# Patient Record
Sex: Female | Born: 1940 | Race: White | Hispanic: No | Marital: Married | Smoking: Former smoker
Health system: Southern US, Community
[De-identification: ages and names within clinical notes are randomized; demographics above are authoritative.]

## PROBLEM LIST (undated history)

## (undated) DIAGNOSIS — Z87440 Personal history of urinary (tract) infections: Secondary | ICD-10-CM

## (undated) DIAGNOSIS — Z87891 Personal history of nicotine dependence: Secondary | ICD-10-CM

## (undated) DIAGNOSIS — N200 Calculus of kidney: Secondary | ICD-10-CM

## (undated) DIAGNOSIS — F0390 Unspecified dementia without behavioral disturbance: Secondary | ICD-10-CM

## (undated) DIAGNOSIS — M1990 Unspecified osteoarthritis, unspecified site: Secondary | ICD-10-CM

## (undated) DIAGNOSIS — E876 Hypokalemia: Secondary | ICD-10-CM

## (undated) DIAGNOSIS — S02401A Maxillary fracture, unspecified, initial encounter for closed fracture: Secondary | ICD-10-CM

## (undated) DIAGNOSIS — I10 Essential (primary) hypertension: Secondary | ICD-10-CM

## (undated) DIAGNOSIS — N189 Chronic kidney disease, unspecified: Secondary | ICD-10-CM

## (undated) DIAGNOSIS — K6289 Other specified diseases of anus and rectum: Secondary | ICD-10-CM

## (undated) DIAGNOSIS — D649 Anemia, unspecified: Secondary | ICD-10-CM

## (undated) DIAGNOSIS — G309 Alzheimer's disease, unspecified: Secondary | ICD-10-CM

## (undated) DIAGNOSIS — K7689 Other specified diseases of liver: Secondary | ICD-10-CM

## (undated) DIAGNOSIS — K208 Other esophagitis: Secondary | ICD-10-CM

## (undated) DIAGNOSIS — F0280 Dementia in other diseases classified elsewhere without behavioral disturbance: Secondary | ICD-10-CM

## (undated) HISTORY — DX: Essential (primary) hypertension: I10

## (undated) HISTORY — DX: Personal history of nicotine dependence: Z87.891

## (undated) HISTORY — DX: Other specified diseases of anus and rectum: K62.89

## (undated) HISTORY — DX: Other specified diseases of liver: K76.89

## (undated) HISTORY — DX: Hypokalemia: E87.6

## (undated) HISTORY — DX: Other esophagitis: K20.8

## (undated) HISTORY — DX: Maxillary fracture, unspecified side, initial encounter for closed fracture: S02.401A

## (undated) HISTORY — DX: Unspecified osteoarthritis, unspecified site: M19.90

## (undated) HISTORY — DX: Alzheimer's disease, unspecified: G30.9

## (undated) HISTORY — PX: COLONOSCOPY: SHX174

## (undated) HISTORY — PX: BREAST REDUCTION SURGERY: SHX8

## (undated) HISTORY — DX: Dementia in other diseases classified elsewhere, unspecified severity, without behavioral disturbance, psychotic disturbance, mood disturbance, and anxiety: F02.80

## (undated) HISTORY — DX: Personal history of urinary (tract) infections: Z87.440

## (undated) HISTORY — DX: Unspecified dementia without behavioral disturbance: F03.90

## (undated) HISTORY — DX: Calculus of kidney: N20.0

---

## 1983-08-28 HISTORY — PX: BREAST LUMPECTOMY: SHX2

## 1999-08-28 DIAGNOSIS — I10 Essential (primary) hypertension: Secondary | ICD-10-CM

## 1999-08-28 HISTORY — DX: Essential (primary) hypertension: I10

## 2001-08-27 HISTORY — PX: BREAST REDUCTION SURGERY: SHX8

## 2001-10-06 ENCOUNTER — Other Ambulatory Visit: Admission: RE | Admit: 2001-10-06 | Discharge: 2001-10-06 | Payer: Self-pay | Admitting: Family Medicine

## 2003-08-28 DIAGNOSIS — M1990 Unspecified osteoarthritis, unspecified site: Secondary | ICD-10-CM

## 2003-08-28 HISTORY — PX: WRIST SURGERY: SHX841

## 2003-08-28 HISTORY — DX: Unspecified osteoarthritis, unspecified site: M19.90

## 2003-08-28 HISTORY — PX: VEIN LIGATION AND STRIPPING: SHX2653

## 2004-12-04 ENCOUNTER — Ambulatory Visit: Payer: Self-pay | Admitting: Family Medicine

## 2004-12-07 ENCOUNTER — Ambulatory Visit: Payer: Self-pay | Admitting: Family Medicine

## 2005-01-15 ENCOUNTER — Ambulatory Visit: Payer: Self-pay | Admitting: Family Medicine

## 2006-01-22 ENCOUNTER — Ambulatory Visit: Payer: Self-pay | Admitting: Family Medicine

## 2006-05-27 ENCOUNTER — Ambulatory Visit: Payer: Self-pay | Admitting: Podiatry

## 2006-05-27 ENCOUNTER — Other Ambulatory Visit: Payer: Self-pay

## 2006-05-29 ENCOUNTER — Ambulatory Visit: Payer: Self-pay | Admitting: Podiatry

## 2007-02-06 ENCOUNTER — Ambulatory Visit: Payer: Self-pay | Admitting: Family Medicine

## 2008-02-09 ENCOUNTER — Ambulatory Visit: Payer: Self-pay | Admitting: Family Medicine

## 2008-02-26 ENCOUNTER — Ambulatory Visit: Payer: Self-pay | Admitting: Gastroenterology

## 2008-08-27 DIAGNOSIS — Z87440 Personal history of urinary (tract) infections: Secondary | ICD-10-CM

## 2008-08-27 DIAGNOSIS — K6289 Other specified diseases of anus and rectum: Secondary | ICD-10-CM

## 2008-08-27 HISTORY — PX: COLON SURGERY: SHX602

## 2008-08-27 HISTORY — DX: Other specified diseases of anus and rectum: K62.89

## 2008-08-27 HISTORY — DX: Personal history of urinary (tract) infections: Z87.440

## 2009-01-11 ENCOUNTER — Ambulatory Visit: Payer: Self-pay | Admitting: General Surgery

## 2009-01-19 ENCOUNTER — Ambulatory Visit: Payer: Self-pay | Admitting: General Surgery

## 2009-01-26 ENCOUNTER — Inpatient Hospital Stay: Payer: Self-pay | Admitting: General Surgery

## 2009-02-16 ENCOUNTER — Ambulatory Visit: Payer: Self-pay | Admitting: Family Medicine

## 2009-02-17 ENCOUNTER — Ambulatory Visit: Payer: Self-pay | Admitting: Family Medicine

## 2009-08-27 DIAGNOSIS — K208 Other esophagitis without bleeding: Secondary | ICD-10-CM

## 2009-08-27 DIAGNOSIS — Z87891 Personal history of nicotine dependence: Secondary | ICD-10-CM

## 2009-08-27 HISTORY — PX: UPPER GI ENDOSCOPY: SHX6162

## 2009-08-27 HISTORY — DX: Other esophagitis without bleeding: K20.80

## 2009-08-27 HISTORY — DX: Personal history of nicotine dependence: Z87.891

## 2009-08-27 HISTORY — PX: OTHER SURGICAL HISTORY: SHX169

## 2010-02-20 ENCOUNTER — Ambulatory Visit: Payer: Self-pay | Admitting: Family Medicine

## 2010-03-24 ENCOUNTER — Ambulatory Visit: Payer: Self-pay | Admitting: General Surgery

## 2010-04-04 ENCOUNTER — Ambulatory Visit: Payer: Self-pay | Admitting: General Surgery

## 2010-08-27 DIAGNOSIS — F0390 Unspecified dementia without behavioral disturbance: Secondary | ICD-10-CM

## 2010-08-27 HISTORY — DX: Unspecified dementia, unspecified severity, without behavioral disturbance, psychotic disturbance, mood disturbance, and anxiety: F03.90

## 2011-02-22 ENCOUNTER — Ambulatory Visit: Payer: Self-pay | Admitting: Family Medicine

## 2011-05-09 ENCOUNTER — Ambulatory Visit: Payer: Self-pay | Admitting: Neurology

## 2011-06-08 ENCOUNTER — Emergency Department: Payer: Self-pay | Admitting: Emergency Medicine

## 2011-09-06 ENCOUNTER — Ambulatory Visit: Payer: Self-pay | Admitting: Internal Medicine

## 2011-09-27 ENCOUNTER — Ambulatory Visit (INDEPENDENT_AMBULATORY_CARE_PROVIDER_SITE_OTHER): Payer: Medicare Other | Admitting: Internal Medicine

## 2011-09-27 ENCOUNTER — Encounter: Payer: Self-pay | Admitting: Internal Medicine

## 2011-09-27 DIAGNOSIS — R634 Abnormal weight loss: Secondary | ICD-10-CM

## 2011-09-27 DIAGNOSIS — I10 Essential (primary) hypertension: Secondary | ICD-10-CM

## 2011-09-27 DIAGNOSIS — F0391 Unspecified dementia with behavioral disturbance: Secondary | ICD-10-CM

## 2011-09-27 DIAGNOSIS — F03918 Unspecified dementia, unspecified severity, with other behavioral disturbance: Secondary | ICD-10-CM | POA: Insufficient documentation

## 2011-09-27 NOTE — Assessment & Plan Note (Signed)
Blood pressure well-controlled today. We'll check renal function with labs. Followup in 3 months.

## 2011-09-27 NOTE — Assessment & Plan Note (Signed)
Agreed that this is likely related to recent episode of psychosis and medications. Will however check CMP, CBC with labs. Followup in 3 months.

## 2011-09-27 NOTE — Progress Notes (Signed)
Subjective:    Patient ID: Kelli Matthews, female    DOB: Feb 10, 1941, 71 y.o.   MRN: [id number]  HPI 71 year old female with history of dementia, hypertension presents to establish care. She reports that she is generally doing well. She notes that she suffered from what was diagnosed as a psychotic break in July of 2012. She had extensive intervention with psychiatry and psychologist, and reports that her symptoms are much better controlled on current medications. She is currently living with her husband and attends respite care at [facility] facility twice per week. Her son is also very involved in her care and helps with her everyday activities.  In regards to her hypertension, she reports that her blood pressure has been well-controlled. She denies chest pain, palpitations, or headache.  Her family does note some recent weight loss over the last several months. However, they attribute this to her psychosis and treatment for her psychosis and dementia.  Outpatient Encounter Prescriptions as of 09/27/2011  Medication Sig Dispense Refill  . aspirin EC 81 MG tablet Take 81 mg by mouth daily.      . cholecalciferol (VITAMIN D) 1000 UNITS tablet Take 1,000 Units by mouth daily.      Marland Kitchen donepezil (ARICEPT) 10 MG tablet Take 10 mg by mouth at bedtime as needed.      . fish oil-omega-3 fatty acids 1000 MG capsule Take 2 g by mouth daily.      Marland Kitchen lisinopril-hydrochlorothiazide (PRINZIDE,ZESTORETIC) 10-12.5 MG per tablet Take 1 tablet by mouth daily.      . memantine (NAMENDA) 10 MG tablet Take 10 mg by mouth 2 (two) times daily.      . metoprolol succinate (TOPROL-XL) 25 MG 24 hr tablet Take 25 mg by mouth daily.      . Multiple Vitamins-Minerals (MULTIVITAMIN WITH MINERALS) tablet Take 1 tablet by mouth daily.      . potassium chloride (K-DUR) 10 MEQ tablet Take 10 mEq by mouth 2 (two) times daily.      . QUEtiapine (SEROQUEL) 50 MG tablet Take 75 mg by mouth at bedtime.      . sertraline (ZOLOFT) 100 MG  tablet Take 100 mg by mouth daily.        Review of Systems  Constitutional: Negative for fever, chills, appetite change, fatigue and unexpected weight change.  HENT: Negative for ear pain, congestion, sore throat, trouble swallowing, neck pain, voice change and sinus pressure.   Eyes: Negative for visual disturbance.  Respiratory: Negative for cough, shortness of breath, wheezing and stridor.   Cardiovascular: Negative for chest pain, palpitations and leg swelling.  Gastrointestinal: Negative for nausea, vomiting, abdominal pain, diarrhea, constipation, blood in stool, abdominal distention and anal bleeding.  Genitourinary: Negative for dysuria and flank pain.  Musculoskeletal: Negative for myalgias, arthralgias and gait problem.  Skin: Negative for color change and rash.  Neurological: Negative for dizziness and headaches.  Hematological: Negative for adenopathy. Does not bruise/bleed easily.  Psychiatric/Behavioral: Positive for behavioral problems, confusion and decreased concentration. Negative for suicidal ideas, sleep disturbance and dysphoric mood. The patient is not nervous/anxious.    BP 126/62  Pulse 74  Temp(Src) 98.3 F (36.8 C) (Oral)  Ht 5\' 1"  (1.549 m)  Wt 109 lb (49.442 kg)  BMI 20.60 kg/m2  SpO2 99%     Objective:   Physical Exam  Constitutional: She is oriented to person, place, and time. She appears well-developed and well-nourished. No distress.  HENT:  Head: Normocephalic and atraumatic.  Right Ear: External  ear normal.  Left Ear: External ear normal.  Nose: Nose normal.  Mouth/Throat: Oropharynx is clear and moist. No oropharyngeal exudate.  Eyes: Conjunctivae are normal. Pupils are equal, round, and reactive to light. Right eye exhibits no discharge. Left eye exhibits no discharge. No scleral icterus.  Neck: Normal range of motion. Neck supple. No tracheal deviation present. No thyromegaly present.  Cardiovascular: Normal rate, regular rhythm, normal  heart sounds and intact distal pulses.  Exam reveals no gallop and no friction rub.   No murmur heard. Pulmonary/Chest: Effort normal and breath sounds normal. No respiratory distress. She has no wheezes. She has no rales. She exhibits no tenderness.  Abdominal: Soft. Bowel sounds are normal. She exhibits no distension. There is no tenderness.  Musculoskeletal: Normal range of motion. She exhibits no edema and no tenderness.  Lymphadenopathy:    She has no cervical adenopathy.  Neurological: She is alert and oriented to person, place, and time. No cranial nerve deficit. She exhibits normal muscle tone. Coordination normal.  Skin: Skin is warm and dry. No rash noted. She is not diaphoretic. No erythema. No pallor.  Psychiatric: She has a normal mood and affect. Her speech is normal and behavior is normal. Judgment and thought content normal. She exhibits abnormal recent memory and abnormal remote memory.          Assessment & Plan:

## 2011-09-27 NOTE — Assessment & Plan Note (Signed)
Will obtain records from psychiatrist and psychologist. Kelli Matthews continue current medications. Followup in 3 months.

## 2011-09-28 LAB — CBC WITH DIFFERENTIAL/PLATELET
Basophils Absolute: 0.1 10*3/uL (ref 0.0–0.1)
Eosinophils Absolute: 0.2 10*3/uL (ref 0.0–0.7)
Hemoglobin: 12.3 g/dL (ref 12.0–15.0)
Lymphocytes Relative: 21.5 % (ref 12.0–46.0)
MCHC: 33.8 g/dL (ref 30.0–36.0)
Neutro Abs: 4.8 10*3/uL (ref 1.4–7.7)
Neutrophils Relative %: 65.2 % (ref 43.0–77.0)
Platelets: 300 10*3/uL (ref 150.0–400.0)
RDW: 13 % (ref 11.5–14.6)

## 2011-09-28 LAB — COMPREHENSIVE METABOLIC PANEL
ALT: 24 U/L (ref 0–35)
AST: 27 U/L (ref 0–37)
Albumin: 4.5 g/dL (ref 3.5–5.2)
Calcium: 9.6 mg/dL (ref 8.4–10.5)
Chloride: 109 mEq/L (ref 96–112)
Potassium: 3.9 mEq/L (ref 3.5–5.1)
Sodium: 139 mEq/L (ref 135–145)
Total Protein: 7.5 g/dL (ref 6.0–8.3)

## 2011-10-31 ENCOUNTER — Telehealth: Payer: Self-pay | Admitting: Internal Medicine

## 2011-10-31 NOTE — Telephone Encounter (Signed)
Patient has an appt for tomorrow. Spoke with sone and he will bring her tomorrow for her appt. He was advised to make sure she is drinking plenty of clear liquid to avoid dehydration and also was advised bland diet such as the brat diet.

## 2011-10-31 NOTE — Telephone Encounter (Signed)
[phone] Pt son called wanting to get apponitment for Kelli Matthews  Made for 3/7 @ 4 This weekend she diarrehea not eatng.  Pt has demintia and all she can say is she doesn't feel good.  Son stated pt is groggy and won't get out of bed

## 2011-11-01 ENCOUNTER — Encounter: Payer: Self-pay | Admitting: Internal Medicine

## 2011-11-01 ENCOUNTER — Other Ambulatory Visit: Payer: Self-pay | Admitting: Internal Medicine

## 2011-11-01 ENCOUNTER — Ambulatory Visit (INDEPENDENT_AMBULATORY_CARE_PROVIDER_SITE_OTHER): Payer: Medicare Other | Admitting: Internal Medicine

## 2011-11-01 VITALS — BP 138/68 | HR 93 | Temp 98.4°F | Ht 61.0 in | Wt 108.0 lb

## 2011-11-01 DIAGNOSIS — R110 Nausea: Secondary | ICD-10-CM

## 2011-11-01 DIAGNOSIS — R634 Abnormal weight loss: Secondary | ICD-10-CM

## 2011-11-01 DIAGNOSIS — I10 Essential (primary) hypertension: Secondary | ICD-10-CM

## 2011-11-01 DIAGNOSIS — R197 Diarrhea, unspecified: Secondary | ICD-10-CM | POA: Insufficient documentation

## 2011-11-01 NOTE — Progress Notes (Signed)
Subjective:    Patient ID: Kelli Matthews, female    DOB: 08/03/1941, 71 y.o.   MRN: [id number]  HPI 71 year old female with history of dementia presents for acute visit complaining of approximately 4 week history of watery diarrhea. She notes that she typically has 4-5 episodes per day of watery diarrhea. She occasionally has blood on the tissue paper when she wipes but denies blood in the stool. She occasionally has mucus in her stool. She denies abdominal pain. She denies nausea or vomiting. She denies fever or chills. No other family members have been ill. However, patient does periodically attend care at a nursing facility.   Review of Systems  Constitutional: Negative for fever, chills, appetite change, fatigue and unexpected weight change.  HENT: Negative for ear pain, congestion, sore throat, trouble swallowing, neck pain, voice change and sinus pressure.   Eyes: Negative for visual disturbance.  Respiratory: Negative for cough, shortness of breath, wheezing and stridor.   Cardiovascular: Negative for chest pain, palpitations and leg swelling.  Gastrointestinal: Positive for diarrhea and anal bleeding. Negative for nausea, vomiting, abdominal pain, constipation, blood in stool and abdominal distention.  Genitourinary: Negative for dysuria and flank pain.  Musculoskeletal: Negative for myalgias, arthralgias and gait problem.  Skin: Negative for color change and rash.  Neurological: Negative for dizziness and headaches.  Hematological: Negative for adenopathy. Does not bruise/bleed easily.  Psychiatric/Behavioral: Negative for suicidal ideas, sleep disturbance and dysphoric mood. The patient is not nervous/anxious.        Outpatient Encounter Prescriptions as of 11/01/2011  Medication Sig Dispense Refill  . aspirin EC 81 MG tablet Take 81 mg by mouth daily.      . cholecalciferol (VITAMIN D) 1000 UNITS tablet Take 1,000 Units by mouth daily.      Marland Kitchen donepezil (ARICEPT) 10 MG tablet Take  10 mg by mouth at bedtime as needed.      . fish oil-omega-3 fatty acids 1000 MG capsule Take 2 g by mouth daily.      Marland Kitchen lisinopril-hydrochlorothiazide (PRINZIDE,ZESTORETIC) 10-12.5 MG per tablet Take 1 tablet by mouth daily.      . memantine (NAMENDA) 10 MG tablet Take 10 mg by mouth 2 (two) times daily.      . metoprolol succinate (TOPROL-XL) 25 MG 24 hr tablet Take 25 mg by mouth daily.      . Multiple Vitamins-Minerals (MULTIVITAMIN WITH MINERALS) tablet Take 1 tablet by mouth daily.      . potassium chloride (K-DUR) 10 MEQ tablet Take 10 mEq by mouth 2 (two) times daily.      . QUEtiapine (SEROQUEL) 50 MG tablet Take 75 mg by mouth at bedtime.      . sertraline (ZOLOFT) 100 MG tablet Take 100 mg by mouth daily.       BP 138/68  Pulse 93  Temp(Src) 98.4 F (36.9 C) (Oral)  Ht 5\' 1"  (1.549 m)  Wt 108 lb (48.988 kg)  BMI 20.41 kg/m2  SpO2 98%  Objective:   Physical Exam  Constitutional: She is oriented to person, place, and time. She appears well-developed and well-nourished. No distress.  HENT:  Head: Normocephalic and atraumatic.  Right Ear: External ear normal.  Left Ear: External ear normal.  Nose: Nose normal.  Mouth/Throat: Oropharynx is clear and moist. No oropharyngeal exudate.  Eyes: Conjunctivae are normal. Pupils are equal, round, and reactive to light. Right eye exhibits no discharge. Left eye exhibits no discharge. No scleral icterus.  Neck: Normal range of motion. Neck supple.  No tracheal deviation present. No thyromegaly present.  Cardiovascular: Normal rate, regular rhythm, normal heart sounds and intact distal pulses.  Exam reveals no gallop and no friction rub.   No murmur heard. Pulmonary/Chest: Effort normal and breath sounds normal. No respiratory distress. She has no wheezes. She has no rales. She exhibits no tenderness.  Abdominal: Soft. Bowel sounds are normal. She exhibits no distension and no mass. There is Tenderness: epigastric.Marland Kitchen There is no rebound and  no guarding.  Musculoskeletal: Normal range of motion. She exhibits no edema and no tenderness.  Lymphadenopathy:    She has no cervical adenopathy.  Neurological: She is alert and oriented to person, place, and time. No cranial nerve deficit. She exhibits normal muscle tone. Coordination normal.  Skin: Skin is warm and dry. No rash noted. She is not diaphoretic. No erythema. No pallor.  Psychiatric: She has a normal mood and affect. Her behavior is normal. Judgment and thought content normal.          Assessment & Plan:

## 2011-11-01 NOTE — Assessment & Plan Note (Signed)
Symptoms are concerning for infectious diarrhea. Will send stool for culture today. We'll also check for C. difficile toxin. Will check CMP, TSH, and H. pylori with labs. Encouraged patient to increase fluid intake. Will not use Imodium until we are sure no infectious source. If infectious workup is negative, will set her up for colonoscopy.

## 2011-11-02 ENCOUNTER — Other Ambulatory Visit: Payer: Medicare Other

## 2011-11-02 ENCOUNTER — Telehealth: Payer: Self-pay | Admitting: *Deleted

## 2011-11-02 LAB — COMPREHENSIVE METABOLIC PANEL
Alkaline Phosphatase: 80 U/L (ref 39–117)
Glucose, Bld: 96 mg/dL (ref 70–99)
Sodium: 137 mEq/L (ref 135–145)
Total Bilirubin: 0.1 mg/dL — ABNORMAL LOW (ref 0.3–1.2)
Total Protein: 6.8 g/dL (ref 6.0–8.3)

## 2011-11-02 LAB — CBC WITH DIFFERENTIAL/PLATELET
Basophils Relative: 0.4 % (ref 0.0–3.0)
Eosinophils Relative: 2.5 % (ref 0.0–5.0)
HCT: 35.7 % — ABNORMAL LOW (ref 36.0–46.0)
Lymphs Abs: 1.5 10*3/uL (ref 0.7–4.0)
MCV: 89.1 fl (ref 78.0–100.0)
Monocytes Absolute: 1 10*3/uL (ref 0.1–1.0)
Platelets: 277 10*3/uL (ref 150.0–400.0)
RBC: 4.01 Mil/uL (ref 3.87–5.11)
WBC: 7.5 10*3/uL (ref 4.5–10.5)

## 2011-11-02 NOTE — Progress Notes (Signed)
Addended by: Ronna Polio A on: 11/02/2011 05:41 PM   Modules accepted: Orders

## 2011-11-02 NOTE — Telephone Encounter (Signed)
I put in order with her note, I addended it.

## 2011-11-02 NOTE — Telephone Encounter (Signed)
Husband informed, lab visit scheduled for Tuesday, Can you please put in referral order for u/s?

## 2011-11-02 NOTE — Telephone Encounter (Signed)
Message copied by Vernie Murders on Fri Nov 02, 2011  7:04 PM ------      Message from: Ronna Polio A      Created: Fri Nov 02, 2011  5:40 PM       Labs show inflammation of the pancreas.  I would like to repeat CMP and lipase next week and get [place] of pancreas.

## 2011-11-05 ENCOUNTER — Encounter: Payer: Self-pay | Admitting: Internal Medicine

## 2011-11-06 ENCOUNTER — Other Ambulatory Visit (INDEPENDENT_AMBULATORY_CARE_PROVIDER_SITE_OTHER): Payer: Medicare Other | Admitting: *Deleted

## 2011-11-06 DIAGNOSIS — K859 Acute pancreatitis without necrosis or infection, unspecified: Secondary | ICD-10-CM

## 2011-11-07 ENCOUNTER — Telehealth: Payer: Self-pay | Admitting: *Deleted

## 2011-11-07 ENCOUNTER — Telehealth: Payer: Self-pay | Admitting: Internal Medicine

## 2011-11-07 LAB — COMPREHENSIVE METABOLIC PANEL
Albumin: 4 g/dL (ref 3.5–5.2)
CO2: 23 mEq/L (ref 19–32)
Calcium: 9.2 mg/dL (ref 8.4–10.5)
GFR: 72.11 mL/min (ref >60.00)
Glucose, Bld: 80 mg/dL (ref 70–99)
Potassium: 2.7 mEq/L — CL (ref 3.5–5.1)
Sodium: 139 mEq/L (ref 135–145)
Total Bilirubin: 0.1 mg/dL — ABNORMAL LOW (ref 0.3–1.2)
Total Protein: 6.6 g/dL (ref 6.0–8.3)

## 2011-11-07 NOTE — Telephone Encounter (Signed)
Received call from [facility] lab- Critical potassium of 2.7.

## 2011-11-07 NOTE — Telephone Encounter (Signed)
See result note -  I left him detailed VM to go to ER if pt's symptoms were severe. OR if stable keep u/s and schedule f/u on Friday. I requested that either way he call office first thing in AM w/update.

## 2011-11-07 NOTE — Telephone Encounter (Signed)
Left VM on son's VM. My Chart mess sent also. MD aware of results, see result note.

## 2011-11-07 NOTE — Telephone Encounter (Signed)
Pt son returned you call

## 2011-11-08 ENCOUNTER — Ambulatory Visit: Payer: Self-pay | Admitting: Internal Medicine

## 2011-11-09 ENCOUNTER — Encounter: Payer: Self-pay | Admitting: Internal Medicine

## 2011-11-09 ENCOUNTER — Ambulatory Visit (INDEPENDENT_AMBULATORY_CARE_PROVIDER_SITE_OTHER): Payer: Medicare Other | Admitting: Internal Medicine

## 2011-11-09 ENCOUNTER — Ambulatory Visit: Payer: Self-pay | Admitting: Internal Medicine

## 2011-11-09 VITALS — BP 118/52 | HR 79 | Temp 98.3°F | Wt 108.0 lb

## 2011-11-09 DIAGNOSIS — R932 Abnormal findings on diagnostic imaging of liver and biliary tract: Secondary | ICD-10-CM

## 2011-11-09 DIAGNOSIS — R634 Abnormal weight loss: Secondary | ICD-10-CM

## 2011-11-09 DIAGNOSIS — R935 Abnormal findings on diagnostic imaging of other abdominal regions, including retroperitoneum: Secondary | ICD-10-CM

## 2011-11-09 DIAGNOSIS — R197 Diarrhea, unspecified: Secondary | ICD-10-CM

## 2011-11-09 NOTE — Progress Notes (Signed)
Subjective:    Patient ID: Kelli Matthews, female    DOB: [birth year], 71 y.o.   MRN: [id number]  HPI 71 year old female with a several month history of weight loss and three-week history of diarrhea presents for followup. She reports that she is generally been feeling better. She reports her diarrhea is improving. She denies any nausea, vomiting, abdominal pain. Labwork was remarkable for elevated lipase that over 300. Ultrasound of the abdomen performed yesterday was remarkable for cystic and nodular mass within the liver. They were unable to visualize the pancreas. The common bile duct was noted to be dilated. Radiology recommended a CT of the abdomen for further evaluation. Discussed this with patient and her son. They understand and are agreeable to having CT performed today.  Outpatient Encounter Prescriptions as of 11/09/2011  Medication Sig Dispense Refill  . aspirin EC 81 MG tablet Take 81 mg by mouth daily.      . cholecalciferol (VITAMIN D) 1000 UNITS tablet Take 1,000 Units by mouth daily.      Marland Kitchen donepezil (ARICEPT) 10 MG tablet Take 10 mg by mouth at bedtime as needed.      . fish oil-omega-3 fatty acids 1000 MG capsule Take 2 g by mouth daily.      Marland Kitchen lisinopril-hydrochlorothiazide (PRINZIDE,ZESTORETIC) 10-12.5 MG per tablet Take 1 tablet by mouth daily.      . memantine (NAMENDA) 10 MG tablet Take 10 mg by mouth 2 (two) times daily.      . metoprolol succinate (TOPROL-XL) 25 MG 24 hr tablet Take 25 mg by mouth daily.      . Multiple Vitamins-Minerals (MULTIVITAMIN WITH MINERALS) tablet Take 1 tablet by mouth daily.      . potassium chloride (K-DUR) 10 MEQ tablet Take 10 mEq by mouth 2 (two) times daily.      . QUEtiapine (SEROQUEL) 50 MG tablet Take 75 mg by mouth at bedtime.      . sertraline (ZOLOFT) 100 MG tablet Take 100 mg by mouth daily.        Review of Systems  Constitutional: Positive for unexpected weight change. Negative for fever and chills.  Respiratory: Negative for  shortness of breath.   Cardiovascular: Negative for chest pain.  Gastrointestinal: Positive for diarrhea. Negative for nausea, vomiting, abdominal pain, constipation, blood in stool and abdominal distention.   BP 118/52  Pulse 79  Temp(Src) 98.3 F (36.8 C) (Oral)  Wt 108 lb (48.988 kg)  SpO2 98%     Objective:   Physical Exam  Constitutional: She is oriented to person, place, and time. She appears well-developed and well-nourished. No distress.  HENT:  Head: Normocephalic and atraumatic.  Right Ear: External ear normal.  Left Ear: External ear normal.  Nose: Nose normal.  Mouth/Throat: Oropharynx is clear and moist. No oropharyngeal exudate.  Eyes: Conjunctivae are normal. Pupils are equal, round, and reactive to light. Right eye exhibits no discharge. Left eye exhibits no discharge. No scleral icterus.  Neck: Normal range of motion. Neck supple. No tracheal deviation present. No thyromegaly present.  Pulmonary/Chest: Effort normal.  Abdominal: Soft. She exhibits no distension and no mass. There is no tenderness. There is no rebound and no guarding.  Musculoskeletal: Normal range of motion. She exhibits no edema and no tenderness.  Lymphadenopathy:    She has no cervical adenopathy.  Neurological: She is alert and oriented to person, place, and time. No cranial nerve deficit. She exhibits normal muscle tone. Coordination normal.  Skin: Skin is warm and dry.  No rash noted. She is not diaphoretic. No erythema. No pallor.  Psychiatric: She has a normal mood and affect. Her speech is normal and behavior is normal. Thought content normal. Cognition and memory are impaired. She expresses inappropriate judgment.          Assessment & Plan:

## 2011-11-09 NOTE — Assessment & Plan Note (Signed)
Recent ultrasound of the abdomen is concerning for a malignancy within the liver versus pancreas. We'll get CT of the abdomen for further evaluation. Followup 3 days.

## 2011-11-09 NOTE — Assessment & Plan Note (Signed)
Given elevation of lipase, question pancreatitis as etiology of patient's symptoms. Symptoms seem to be improving. Given findings on ultrasound of the abdomen, will get CT of the abdomen for further evaluation. Stool culture was negative. Will have patient followup in 3 days.

## 2011-11-09 NOTE — Assessment & Plan Note (Signed)
Cystic and nodular mass noted within the liver. Common bile duct was dilated. Pancreas was not visualized. Will get CT of the abdomen for further evaluation.

## 2011-11-12 ENCOUNTER — Ambulatory Visit: Payer: Medicare Other | Admitting: Internal Medicine

## 2011-11-15 ENCOUNTER — Encounter: Payer: Self-pay | Admitting: Internal Medicine

## 2011-11-22 ENCOUNTER — Encounter: Payer: Self-pay | Admitting: Internal Medicine

## 2011-11-22 ENCOUNTER — Ambulatory Visit (INDEPENDENT_AMBULATORY_CARE_PROVIDER_SITE_OTHER): Payer: Medicare Other | Admitting: Internal Medicine

## 2011-11-22 VITALS — BP 110/60 | HR 70 | Temp 98.0°F | Resp 16 | Wt 109.8 lb

## 2011-11-22 DIAGNOSIS — E876 Hypokalemia: Secondary | ICD-10-CM

## 2011-11-22 DIAGNOSIS — R197 Diarrhea, unspecified: Secondary | ICD-10-CM

## 2011-11-22 DIAGNOSIS — F03918 Unspecified dementia, unspecified severity, with other behavioral disturbance: Secondary | ICD-10-CM

## 2011-11-22 DIAGNOSIS — R932 Abnormal findings on diagnostic imaging of liver and biliary tract: Secondary | ICD-10-CM

## 2011-11-22 DIAGNOSIS — F0391 Unspecified dementia with behavioral disturbance: Secondary | ICD-10-CM

## 2011-11-22 LAB — COMPREHENSIVE METABOLIC PANEL
ALT: 19 U/L (ref 0–35)
Alkaline Phosphatase: 79 U/L (ref 39–117)
Sodium: 140 mEq/L (ref 135–145)
Total Bilirubin: 0.3 mg/dL (ref 0.3–1.2)
Total Protein: 6.2 g/dL (ref 6.0–8.3)

## 2011-11-22 NOTE — Assessment & Plan Note (Signed)
CT of the abdomen ultimately showed a cystic area within the liver. Reviewed with the family today.

## 2011-11-22 NOTE — Assessment & Plan Note (Signed)
Symptoms have improved. Symptoms likely secondary to viral gastroenteritis. Will continue to monitor.

## 2011-11-22 NOTE — Assessment & Plan Note (Signed)
Symptoms are currently well controlled with medication. We'll continue to monitor.

## 2011-11-22 NOTE — Progress Notes (Signed)
Subjective:    Patient ID: Kelli Matthews, female    DOB: 1940-12-17, 71 y.o.   MRN: [id number]  HPI 71 year old female with history of dementia presents for followup after recent episode of diarrhea. She reports that diarrhea has now resolved. She has an excellent appetite. She denies any abdominal pain, nausea, or other symptoms. Her family reports that she has been doing well. They have no new concerns today.  Outpatient Encounter Prescriptions as of 11/22/2011  Medication Sig Dispense Refill  . aspirin EC 81 MG tablet Take 81 mg by mouth daily.      . cholecalciferol (VITAMIN D) 1000 UNITS tablet Take 1,000 Units by mouth daily.      Marland Kitchen donepezil (ARICEPT) 10 MG tablet Take 10 mg by mouth at bedtime as needed.      Marland Kitchen lisinopril-hydrochlorothiazide (PRINZIDE,ZESTORETIC) 10-12.5 MG per tablet Take 1 tablet by mouth daily.      . memantine (NAMENDA) 10 MG tablet Take 10 mg by mouth 2 (two) times daily.      . metoprolol succinate (TOPROL-XL) 25 MG 24 hr tablet Take 25 mg by mouth daily.      . Multiple Vitamins-Minerals (MULTIVITAMIN WITH MINERALS) tablet Take 1 tablet by mouth daily.      . QUEtiapine (SEROQUEL) 50 MG tablet Take 75 mg by mouth at bedtime.      . sertraline (ZOLOFT) 100 MG tablet Take 100 mg by mouth daily.        Review of Systems  Constitutional: Negative for fever, chills, appetite change, fatigue and unexpected weight change.  HENT: Negative for ear pain, congestion, sore throat, trouble swallowing, neck pain, voice change and sinus pressure.   Eyes: Negative for visual disturbance.  Respiratory: Negative for cough, shortness of breath, wheezing and stridor.   Cardiovascular: Negative for chest pain, palpitations and leg swelling.  Gastrointestinal: Negative for nausea, vomiting, abdominal pain, diarrhea, constipation, blood in stool, abdominal distention and anal bleeding.  Genitourinary: Negative for dysuria and flank pain.  Musculoskeletal: Negative for myalgias,  arthralgias and gait problem.  Skin: Negative for color change and rash.  Neurological: Negative for dizziness and headaches.  Hematological: Negative for adenopathy. Does not bruise/bleed easily.  Psychiatric/Behavioral: Positive for behavioral problems, confusion and decreased concentration. Negative for suicidal ideas, sleep disturbance and dysphoric mood. The patient is not nervous/anxious.    BP 110/60  Pulse 70  Temp(Src) 98 F (36.7 C) (Oral)  Resp 16  Wt 109 lb 12 oz (49.782 kg)  SpO2 99%     Objective:   Physical Exam  Constitutional: She is oriented to person, place, and time. She appears well-developed and well-nourished. No distress.  HENT:  Head: Normocephalic and atraumatic.  Right Ear: External ear normal.  Left Ear: External ear normal.  Nose: Nose normal.  Mouth/Throat: Oropharynx is clear and moist. No oropharyngeal exudate.  Eyes: Conjunctivae are normal. Pupils are equal, round, and reactive to light. Right eye exhibits no discharge. Left eye exhibits no discharge. No scleral icterus.  Neck: Normal range of motion. Neck supple. No tracheal deviation present. No thyromegaly present.  Cardiovascular: Normal rate, regular rhythm, normal heart sounds and intact distal pulses.  Exam reveals no gallop and no friction rub.   No murmur heard. Pulmonary/Chest: Effort normal and breath sounds normal. No respiratory distress. She has no wheezes. She has no rales. She exhibits no tenderness.  Abdominal: Soft. Normal appearance and bowel sounds are normal. There is no hepatosplenomegaly. There is no tenderness.  Musculoskeletal: Normal range  of motion. She exhibits no edema and no tenderness.  Lymphadenopathy:    She has no cervical adenopathy.  Neurological: She is alert and oriented to person, place, and time. No cranial nerve deficit. She exhibits normal muscle tone. Coordination normal.  Skin: Skin is warm and dry. No rash noted. She is not diaphoretic. No erythema. No  pallor.  Psychiatric: She has a normal mood and affect. Her speech is normal and behavior is normal. Thought content normal. Cognition and memory are impaired. She expresses impulsivity and inappropriate judgment.          Assessment & Plan:

## 2011-12-03 ENCOUNTER — Encounter: Payer: Self-pay | Admitting: Internal Medicine

## 2011-12-27 ENCOUNTER — Ambulatory Visit: Payer: Medicare Other | Admitting: Internal Medicine

## 2012-02-21 ENCOUNTER — Ambulatory Visit: Payer: Self-pay | Admitting: Obstetrics and Gynecology

## 2012-02-21 ENCOUNTER — Ambulatory Visit (INDEPENDENT_AMBULATORY_CARE_PROVIDER_SITE_OTHER): Payer: Medicare Other | Admitting: Internal Medicine

## 2012-02-21 ENCOUNTER — Encounter: Payer: Self-pay | Admitting: Internal Medicine

## 2012-02-21 VITALS — BP 110/60 | HR 65 | Temp 98.4°F | Wt 113.0 lb

## 2012-02-21 DIAGNOSIS — F0391 Unspecified dementia with behavioral disturbance: Secondary | ICD-10-CM

## 2012-02-21 DIAGNOSIS — I10 Essential (primary) hypertension: Secondary | ICD-10-CM

## 2012-02-21 LAB — COMPREHENSIVE METABOLIC PANEL
Albumin: 3.8 g/dL (ref 3.5–5.2)
Alkaline Phosphatase: 75 U/L (ref 39–117)
BUN: 30 mg/dL — ABNORMAL HIGH (ref 6–23)
GFR: 67.34 mL/min (ref >60.00)
Glucose, Bld: 87 mg/dL (ref 70–99)
Total Bilirubin: 0.4 mg/dL (ref 0.3–1.2)

## 2012-02-21 NOTE — Assessment & Plan Note (Signed)
Blood pressure well-controlled today. Renal function normal with labs. Will continue current medications. Followup in 3 months.

## 2012-02-21 NOTE — Progress Notes (Signed)
Subjective:    Patient ID: Kelli Matthews, female    DOB: [birth year], 71 y.o.   MRN: [id number]  HPI 71 year old female with history of dementia and psychosis presents for followup. Her son and husband reports that she is generally been doing well. She continues to live at home and attends a local program for patients with dementia several days per week. She notes she has been doing well. She reports excellent appetite. Her weight has improved since last visit. She reports normal energy level.. Been no behavioral issues recently.  Outpatient Encounter Prescriptions as of 02/21/2012  Medication Sig Dispense Refill  . aspirin EC 81 MG tablet Take 81 mg by mouth daily.      . cholecalciferol (VITAMIN D) 1000 UNITS tablet Take 1,000 Units by mouth daily.      Marland Kitchen donepezil (ARICEPT) 10 MG tablet Take 10 mg by mouth at bedtime as needed.      Marland Kitchen lisinopril-hydrochlorothiazide (PRINZIDE,ZESTORETIC) 10-12.5 MG per tablet Take 1 tablet by mouth daily.      . memantine (NAMENDA) 10 MG tablet Take 10 mg by mouth 2 (two) times daily.      . metoprolol succinate (TOPROL-XL) 25 MG 24 hr tablet Take 25 mg by mouth daily.      . Multiple Vitamins-Minerals (MULTIVITAMIN WITH MINERALS) tablet Take 1 tablet by mouth daily.      . QUEtiapine (SEROQUEL) 50 MG tablet Take 75 mg by mouth at bedtime.      . sertraline (ZOLOFT) 100 MG tablet Take 100 mg by mouth daily.        Review of Systems  Constitutional: Negative for fever, chills, appetite change, fatigue and unexpected weight change.  HENT: Negative for ear pain, congestion, sore throat, trouble swallowing, neck pain, voice change and sinus pressure.   Eyes: Negative for visual disturbance.  Respiratory: Negative for cough, shortness of breath, wheezing and stridor.   Cardiovascular: Negative for chest pain, palpitations and leg swelling.  Gastrointestinal: Negative for nausea, vomiting, abdominal pain, diarrhea, constipation, blood in stool, abdominal  distention and anal bleeding.  Genitourinary: Negative for dysuria and flank pain.  Musculoskeletal: Negative for myalgias, arthralgias and gait problem.  Skin: Negative for color change and rash.  Neurological: Negative for dizziness and headaches.  Hematological: Negative for adenopathy. Does not bruise/bleed easily.  Psychiatric/Behavioral: Positive for confusion and decreased concentration. Negative for suicidal ideas, behavioral problems, disturbed wake/sleep cycle, dysphoric mood and agitation. The patient is not nervous/anxious.    BP 110/60  Pulse 65  Temp 98.4 F (36.9 C) (Oral)  Wt 113 lb (51.256 kg)  SpO2 98%     Objective:   Physical Exam  Constitutional: She is oriented to person, place, and time. She appears well-developed and well-nourished. No distress.  HENT:  Head: Normocephalic and atraumatic.  Right Ear: External ear normal.  Left Ear: External ear normal.  Nose: Nose normal.  Mouth/Throat: Oropharynx is clear and moist. No oropharyngeal exudate.  Eyes: Conjunctivae are normal. Pupils are equal, round, and reactive to light. Right eye exhibits no discharge. Left eye exhibits no discharge. No scleral icterus.  Neck: Normal range of motion. Neck supple. No tracheal deviation present. No thyromegaly present.  Cardiovascular: Normal rate, regular rhythm, normal heart sounds and intact distal pulses.  Exam reveals no gallop and no friction rub.   No murmur heard. Pulmonary/Chest: Effort normal and breath sounds normal. No respiratory distress. She has no wheezes. She has no rales. She exhibits no tenderness.  Musculoskeletal: Normal range of  motion. She exhibits no edema and no tenderness.  Lymphadenopathy:    She has no cervical adenopathy.  Neurological: She is alert and oriented to person, place, and time. No cranial nerve deficit. She exhibits normal muscle tone. Coordination normal.  Skin: Skin is warm and dry. No rash noted. She is not diaphoretic. No erythema.  No pallor.  Psychiatric: She has a normal mood and affect. Her speech is normal and behavior is normal. Thought content normal. Cognition and memory are impaired. She expresses impulsivity and inappropriate judgment.          Assessment & Plan:

## 2012-02-21 NOTE — Assessment & Plan Note (Signed)
Symptomatically doing very well. Living at home and attending dementia program several days per week. Will continue to monitor. We'll continue current medications.

## 2012-02-25 ENCOUNTER — Ambulatory Visit: Payer: Self-pay | Admitting: Internal Medicine

## 2012-03-04 ENCOUNTER — Encounter: Payer: Self-pay | Admitting: Internal Medicine

## 2012-03-25 ENCOUNTER — Other Ambulatory Visit: Payer: Self-pay | Admitting: *Deleted

## 2012-03-25 MED ORDER — METOPROLOL SUCCINATE ER 25 MG PO TB24: 25.0000 mg | ORAL_TABLET | Freq: Every day | ORAL | Status: DC

## 2012-04-09 ENCOUNTER — Encounter: Payer: Self-pay | Admitting: Internal Medicine

## 2012-05-29 ENCOUNTER — Ambulatory Visit: Payer: Medicare Other | Admitting: Internal Medicine

## 2012-06-03 ENCOUNTER — Encounter: Payer: Self-pay | Admitting: Internal Medicine

## 2012-06-03 ENCOUNTER — Ambulatory Visit (INDEPENDENT_AMBULATORY_CARE_PROVIDER_SITE_OTHER): Payer: Medicare Other | Admitting: Internal Medicine

## 2012-06-03 VITALS — BP 104/60 | HR 65 | Temp 98.4°F | Ht 61.0 in | Wt 117.5 lb

## 2012-06-03 DIAGNOSIS — I10 Essential (primary) hypertension: Secondary | ICD-10-CM

## 2012-06-03 DIAGNOSIS — Z23 Encounter for immunization: Secondary | ICD-10-CM

## 2012-06-03 DIAGNOSIS — Z111 Encounter for screening for respiratory tuberculosis: Secondary | ICD-10-CM

## 2012-06-03 DIAGNOSIS — D649 Anemia, unspecified: Secondary | ICD-10-CM

## 2012-06-03 DIAGNOSIS — D510 Vitamin B12 deficiency anemia due to intrinsic factor deficiency: Secondary | ICD-10-CM

## 2012-06-03 DIAGNOSIS — L819 Disorder of pigmentation, unspecified: Secondary | ICD-10-CM

## 2012-06-03 DIAGNOSIS — F0391 Unspecified dementia with behavioral disturbance: Secondary | ICD-10-CM

## 2012-06-03 DIAGNOSIS — E039 Hypothyroidism, unspecified: Secondary | ICD-10-CM

## 2012-06-03 DIAGNOSIS — R238 Other skin changes: Secondary | ICD-10-CM

## 2012-06-03 LAB — CBC WITH DIFFERENTIAL/PLATELET
Basophils Absolute: 0.1 10*3/uL (ref 0.0–0.1)
Eosinophils Absolute: 0.2 10*3/uL (ref 0.0–0.7)
HCT: 35.9 % — ABNORMAL LOW (ref 36.0–46.0)
Lymphs Abs: 1.3 10*3/uL (ref 0.7–4.0)
MCHC: 32.7 g/dL (ref 30.0–36.0)
Monocytes Absolute: 0.5 10*3/uL (ref 0.1–1.0)
Monocytes Relative: 9.1 % (ref 3.0–12.0)
Neutro Abs: 3.9 10*3/uL (ref 1.4–7.7)
Platelets: 243 10*3/uL (ref 150.0–400.0)
RDW: 12.5 % (ref 11.5–14.6)

## 2012-06-03 LAB — COMPREHENSIVE METABOLIC PANEL
AST: 28 U/L (ref 0–37)
Albumin: 3.9 g/dL (ref 3.5–5.2)
BUN: 28 mg/dL — ABNORMAL HIGH (ref 6–23)
Calcium: 9.2 mg/dL (ref 8.4–10.5)
Chloride: 102 mEq/L (ref 96–112)
Glucose, Bld: 86 mg/dL (ref 70–99)
Potassium: 3.5 mEq/L (ref 3.5–5.1)
Sodium: 138 mEq/L (ref 135–145)
Total Protein: 7.3 g/dL (ref 6.0–8.3)

## 2012-06-03 LAB — TSH: TSH: 1.91 u[IU]/mL (ref 0.35–5.50)

## 2012-06-03 NOTE — Assessment & Plan Note (Signed)
Appears to be secondary to her varicosities. These are superficial. Pedal pulses are normal. There is no edema. There are no symptoms to suggest arterial or venous insufficiency. Will continue to monitor.

## 2012-06-03 NOTE — Progress Notes (Signed)
Subjective:    Patient ID: Kelli Matthews, female    DOB: 1941-07-24, 71 y.o.   MRN: [id number]  HPI 71 year old female with history of hypertension and dementia presents for followup. She presents with her daughter today who is visiting from [place]. Her first concern today is bluish discoloration on the soles of her feet. 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 She denies any pain in her feet or wounds. She denies swelling in her lower extremities. She denies any symptoms of calf pain or claudication. She denies any discoloration of her toes.  In regards to history of hypertension, she reports blood pressure has been well-controlled on current medications. She reports full compliance with her medications which are provided by her husband.  In regards to history of dementia, her husband has noted some progression of symptoms which tend to wax and wane. She has not had any behavioral disturbances while on current medications. She continues to participate in a daycare program at a local facility. She also has nursing care 6 days a week at home. Her husband reports she is very active walking every day. She also seems to have a good appetite. She does occasionally have loose stools after eating which has been chronic for her.  Outpatient Encounter Prescriptions as of 06/03/2012  Medication Sig Dispense Refill  . aspirin EC 81 MG tablet Take 81 mg by mouth daily.      . cholecalciferol (VITAMIN D) 1000 UNITS tablet Take 1,000 Units by mouth daily.      Marland Kitchen donepezil (ARICEPT) 10 MG tablet Take 10 mg by mouth at bedtime as needed.      Marland Kitchen lisinopril-hydrochlorothiazide (PRINZIDE,ZESTORETIC) 10-12.5 MG per tablet Take 1 tablet by mouth daily.      . memantine (NAMENDA) 10 MG tablet Take 10 mg by mouth 2 (two) times daily.      . metoprolol succinate (TOPROL-XL) 25 MG 24 hr tablet Take 1 tablet (25 mg total) by mouth daily.  30 tablet  6  . Multiple Vitamins-Minerals (MULTIVITAMIN WITH MINERALS)  tablet Take 1 tablet by mouth daily.      . QUEtiapine (SEROQUEL) 50 MG tablet Take 75 mg by mouth at bedtime.      . sertraline (ZOLOFT) 100 MG tablet Take 100 mg by mouth daily.       BP 104/60  Pulse 65  Temp 98.4 F (36.9 C) (Oral)  Ht 5\' 1"  (1.549 m)  Wt 117 lb 8 oz (53.298 kg)  BMI 22.20 kg/m2  SpO2 98%  Review of Systems  Constitutional: Negative for fever, chills, appetite change, fatigue and unexpected weight change.  HENT: Negative for ear pain, congestion, sore throat, trouble swallowing, neck pain, voice change and sinus pressure.   Eyes: Negative for visual disturbance.  Respiratory: Negative for cough, shortness of breath, wheezing and stridor.   Cardiovascular: Negative for chest pain, palpitations and leg swelling.  Gastrointestinal: Negative for nausea, vomiting, abdominal pain, diarrhea, constipation, blood in stool, abdominal distention and anal bleeding.  Genitourinary: Negative for dysuria and flank pain.  Musculoskeletal: Negative for myalgias, arthralgias and gait problem.  Skin: Negative for color change and rash.  Neurological: Negative for dizziness and headaches.  Hematological: Negative for adenopathy. Does not bruise/bleed easily.  Psychiatric/Behavioral: Positive for behavioral problems and decreased concentration. Negative for suicidal ideas, disturbed wake/sleep cycle and dysphoric mood. The patient is not nervous/anxious.        Objective:   Physical Exam  Constitutional: She is oriented to person, place, and  time. She appears well-developed and well-nourished. No distress.  HENT:  Head: Normocephalic and atraumatic.  Right Ear: External ear normal.  Left Ear: External ear normal.  Nose: Nose normal.  Mouth/Throat: Oropharynx is clear and moist. No oropharyngeal exudate.  Eyes: Conjunctivae normal are normal. Pupils are equal, round, and reactive to light. Right eye exhibits no discharge. Left eye exhibits no discharge. No scleral icterus.    Neck: Normal range of motion. Neck supple. No tracheal deviation present. No thyromegaly present.  Cardiovascular: Normal rate, regular rhythm, normal heart sounds and intact distal pulses.  Exam reveals no gallop and no friction rub.   No murmur heard. Pulmonary/Chest: Effort normal and breath sounds normal. No respiratory distress. She has no wheezes. She has no rales. She exhibits no tenderness.  Abdominal: Soft. Bowel sounds are normal. She exhibits no distension and no mass. There is no tenderness. There is no rebound and no guarding.  Musculoskeletal: Normal range of motion. She exhibits no edema and no tenderness.  Lymphadenopathy:    She has no cervical adenopathy.  Neurological: She is alert and oriented to person, place, and time. No cranial nerve deficit. She exhibits normal muscle tone. Coordination normal.  Skin: Skin is warm and dry. No rash noted. She is not diaphoretic. No erythema. No pallor.  Psychiatric: She has a normal mood and affect. Her speech is normal and behavior is normal. Thought content normal. Cognition and memory are impaired. She expresses impulsivity. She exhibits abnormal recent memory and abnormal remote memory.          Assessment & Plan:

## 2012-06-03 NOTE — Assessment & Plan Note (Signed)
Gradual progression of memory loss. Behavioral symptoms well controlled on current medications. Patient has followup with her psychiatrist today. Will continue day care at [facility]. Will followup here in 3 months.

## 2012-06-03 NOTE — Assessment & Plan Note (Signed)
Blood pressure well-controlled today. Will check renal function with labs. We'll continue current medications. Followup in 3 months or sooner as needed.

## 2012-06-05 ENCOUNTER — Encounter: Payer: Self-pay | Admitting: Internal Medicine

## 2012-06-05 DIAGNOSIS — E785 Hyperlipidemia, unspecified: Secondary | ICD-10-CM

## 2012-06-05 LAB — TB SKIN TEST: TB Skin Test: NEGATIVE

## 2012-06-06 ENCOUNTER — Telehealth: Payer: Self-pay | Admitting: Internal Medicine

## 2012-06-06 ENCOUNTER — Other Ambulatory Visit (INDEPENDENT_AMBULATORY_CARE_PROVIDER_SITE_OTHER): Payer: Medicare Other

## 2012-06-06 DIAGNOSIS — E785 Hyperlipidemia, unspecified: Secondary | ICD-10-CM

## 2012-06-06 LAB — LDL CHOLESTEROL, DIRECT: Direct LDL: 143.2 mg/dL

## 2012-06-06 LAB — LIPID PANEL
Total CHOL/HDL Ratio: 4
Triglycerides: 101 mg/dL (ref 0.0–149.0)

## 2012-06-06 NOTE — Telephone Encounter (Signed)
error 

## 2012-07-29 ENCOUNTER — Other Ambulatory Visit: Payer: Self-pay | Admitting: General Practice

## 2012-07-29 MED ORDER — DONEPEZIL HCL 10 MG PO TABS: 10.0000 mg | ORAL_TABLET | Freq: Every evening | ORAL | Status: DC | PRN

## 2012-07-29 NOTE — Telephone Encounter (Signed)
Med filled.  

## 2012-08-21 ENCOUNTER — Ambulatory Visit (INDEPENDENT_AMBULATORY_CARE_PROVIDER_SITE_OTHER): Payer: Medicare Other | Admitting: Internal Medicine

## 2012-08-21 ENCOUNTER — Encounter: Payer: Self-pay | Admitting: Internal Medicine

## 2012-08-21 VITALS — BP 120/76 | HR 63 | Temp 98.0°F | Resp 15 | Wt 112.5 lb

## 2012-08-21 DIAGNOSIS — Z1331 Encounter for screening for depression: Secondary | ICD-10-CM

## 2012-08-21 DIAGNOSIS — F0391 Unspecified dementia with behavioral disturbance: Secondary | ICD-10-CM

## 2012-08-21 NOTE — Assessment & Plan Note (Signed)
Progressive dementia. Requires assistance for all ADLs. Currently has sitter 6hr per day. Needs 24/7 care as her husband can no longer provide this.  Encouraged family to consider assisted living/skilled nursing for both pt and her husband. Will set up home health evaluation to see if any additional resources available to help with care. Will continue current medications.

## 2012-08-21 NOTE — Progress Notes (Signed)
Subjective:    Patient ID: Kelli Matthews, female    DOB: Feb 04, 1941, 71 y.o.   MRN: [id number]  HPI 71 year old female with history of dementia presents for followup. She presents with both her son and daughter and with her husband who provides most of her care. The family reports that over the last few months things have become more difficult at home as her husband has been ill with pneumonia and has been unable to provide ongoing care for her. She does have a sitter for 6 hours per day. However, they report meeting additional assistance, likely 24-hour daycare. The patient is unable to perform activities of daily living without assistance such as bathing, toileting, or preparing food or eating. She was participating in a day care program 3 days per week over the last few months but her husband has had difficulty transporting her to this as he has been ill. Family has noted a gradual decline in her ability to function. She has not recently had any medication changes. She continues to be followed by psychiatry. She denies any other new concerns today.   Outpatient Encounter Prescriptions as of 08/21/2012  Medication Sig Dispense Refill  . aspirin EC 81 MG tablet Take 81 mg by mouth daily.      . cholecalciferol (VITAMIN D) 1000 UNITS tablet Take 1,000 Units by mouth daily.      Marland Kitchen desonide (DESOWEN) 0.05 % cream       . donepezil (ARICEPT) 10 MG tablet Take 1 tablet (10 mg total) by mouth at bedtime as needed.  30 tablet  6  . lisinopril-hydrochlorothiazide (PRINZIDE,ZESTORETIC) 10-12.5 MG per tablet Take 1 tablet by mouth daily.      . memantine (NAMENDA) 10 MG tablet Take 10 mg by mouth 2 (two) times daily.      . metoprolol succinate (TOPROL-XL) 25 MG 24 hr tablet Take 1 tablet (25 mg total) by mouth daily.  30 tablet  6  . Multiple Vitamins-Minerals (MULTIVITAMIN WITH MINERALS) tablet Take 1 tablet by mouth daily.      . QUEtiapine (SEROQUEL) 50 MG tablet Take 75 mg by mouth at bedtime.      .  sertraline (ZOLOFT) 100 MG tablet Take 100 mg by mouth daily.      Marland Kitchen triamcinolone cream (KENALOG) 0.1 %        BP 120/76  Pulse 63  Temp 98 F (36.7 C) (Oral)  Resp 15  Wt 112 lb 8 oz (51.03 kg)  SpO2 97%  Review of Systems  Constitutional: Negative for fever, chills, appetite change, fatigue and unexpected weight change.  HENT: Negative for ear pain, congestion, sore throat, trouble swallowing, neck pain, voice change and sinus pressure.   Eyes: Negative for visual disturbance.  Respiratory: Negative for cough, shortness of breath, wheezing and stridor.   Cardiovascular: Negative for chest pain, palpitations and leg swelling.  Gastrointestinal: Negative for nausea, vomiting, abdominal pain, diarrhea, constipation, blood in stool, abdominal distention and anal bleeding.  Genitourinary: Negative for dysuria and flank pain.  Musculoskeletal: Negative for myalgias, arthralgias and gait problem.  Skin: Negative for color change and rash.  Neurological: Negative for dizziness and headaches.  Hematological: Negative for adenopathy. Does not bruise/bleed easily.  Psychiatric/Behavioral: Positive for behavioral problems, confusion, decreased concentration and agitation. Negative for suicidal ideas, sleep disturbance and dysphoric mood. The patient is not nervous/anxious.        Objective:   Physical Exam  Constitutional: She is oriented to person, place, and time. She appears  well-developed and well-nourished. No distress.  HENT:  Head: Normocephalic and atraumatic.  Right Ear: External ear normal.  Left Ear: External ear normal.  Nose: Nose normal.  Mouth/Throat: Oropharynx is clear and moist. No oropharyngeal exudate.  Eyes: Conjunctivae normal are normal. Pupils are equal, round, and reactive to light. Right eye exhibits no discharge. Left eye exhibits no discharge. No scleral icterus.  Neck: Normal range of motion. Neck supple. No tracheal deviation present. No thyromegaly present.    Cardiovascular: Normal rate, regular rhythm, normal heart sounds and intact distal pulses.  Exam reveals no gallop and no friction rub.   No murmur heard. Pulmonary/Chest: Effort normal and breath sounds normal. No respiratory distress. She has no wheezes. She has no rales. She exhibits no tenderness.  Musculoskeletal: Normal range of motion. She exhibits no edema and no tenderness.  Lymphadenopathy:    She has no cervical adenopathy.  Neurological: She is alert and oriented to person, place, and time. No cranial nerve deficit. She exhibits normal muscle tone. Coordination normal.  Skin: Skin is warm and dry. No rash noted. She is not diaphoretic. No erythema. No pallor.  Psychiatric: She has a normal mood and affect. Her speech is normal and behavior is normal. Thought content normal. Cognition and memory are impaired. She expresses impulsivity and inappropriate judgment. She exhibits abnormal recent memory and abnormal remote memory.          Assessment & Plan:  Over 40 minute face-to-face discussion with patient and her family members today discussing plans for ongoing care.

## 2012-08-22 ENCOUNTER — Emergency Department: Payer: Self-pay | Admitting: Emergency Medicine

## 2012-08-22 LAB — URINALYSIS, COMPLETE
Bacteria: NONE SEEN
Bilirubin,UR: NEGATIVE
Blood: NEGATIVE
Hyaline Cast: 3
Ketone: NEGATIVE
Leukocyte Esterase: NEGATIVE
Ph: 5 (ref 4.5–8.0)
Protein: NEGATIVE
RBC,UR: <1 /HPF (ref 0–5)
WBC UR: 1 /HPF (ref 0–5)

## 2012-09-08 ENCOUNTER — Telehealth: Payer: Self-pay | Admitting: Internal Medicine

## 2012-09-08 NOTE — Telephone Encounter (Signed)
She should be seen in clinic.

## 2012-09-08 NOTE — Telephone Encounter (Signed)
Patient's cough is not getting any better son is wanting something called into the pharmacy.

## 2012-09-08 NOTE — Telephone Encounter (Signed)
Left message on cell phone voicemail for patient to return call. 

## 2012-09-08 NOTE — Telephone Encounter (Signed)
Spoke with patients son and scheduled an appt for 09/09/2012 with Raquel at 11:00.

## 2012-09-09 ENCOUNTER — Ambulatory Visit (INDEPENDENT_AMBULATORY_CARE_PROVIDER_SITE_OTHER): Payer: Medicare Other | Admitting: Adult Health

## 2012-09-09 ENCOUNTER — Encounter: Payer: Self-pay | Admitting: Adult Health

## 2012-09-09 VITALS — BP 118/68 | HR 72 | Temp 98.7°F | Resp 16 | Ht 61.0 in | Wt 109.0 lb

## 2012-09-09 DIAGNOSIS — J40 Bronchitis, not specified as acute or chronic: Secondary | ICD-10-CM

## 2012-09-09 MED ORDER — HYDROCODONE-HOMATROPINE 5-1.5 MG/5ML PO SYRP: 5.0000 mL | ORAL_SOLUTION | Freq: Three times a day (TID) | ORAL | Status: DC | PRN

## 2012-09-09 MED ORDER — DOXYCYCLINE HYCLATE 100 MG PO TABS: 100.0000 mg | ORAL_TABLET | Freq: Two times a day (BID) | ORAL | Status: DC

## 2012-09-09 MED ORDER — FLUTICASONE PROPIONATE 50 MCG/ACT NA SUSP: NASAL | Status: DC

## 2012-09-09 NOTE — Patient Instructions (Addendum)
  Start Doxycycline today. You will take this medication twice a day for 7 days.  Start the flonase nasal spray. Two sprays into each nostril daily.  The hycodan syrup is for the cough. This medication may make you drowsy.  Please call if your symptoms do not improve within 3-4 days.

## 2012-09-09 NOTE — Assessment & Plan Note (Signed)
Start doxycycline. Flonase nasal spray for her sinus congestion/inflammation. Hycodan for cough. RTC if symptoms not improved within 3-4 days.

## 2012-09-09 NOTE — Progress Notes (Signed)
  Subjective:    Patient ID: Kelli Matthews, female    DOB: August 27, 1941, 72 y.o.   MRN: [id number]  HPI  Patient is a pleasant 72 y/o female with hx of dementia who presents to clinic with her husband. Patient c/o cough, sinus congestion, rhinorrhea. Symptoms have been ongoing for approximately 1 week. She denies fever, chills. Patient's husband reports that cough is significant and has kept her up during the night.   Current Outpatient Prescriptions on File Prior to Visit  Medication Sig Dispense Refill  . aspirin EC 81 MG tablet Take 81 mg by mouth daily.      . cholecalciferol (VITAMIN D) 1000 UNITS tablet Take 1,000 Units by mouth daily.      Marland Kitchen desonide (DESOWEN) 0.05 % cream as needed.       . donepezil (ARICEPT) 10 MG tablet Take 10 mg by mouth at bedtime.      Marland Kitchen lisinopril-hydrochlorothiazide (PRINZIDE,ZESTORETIC) 10-12.5 MG per tablet Take 1 tablet by mouth daily.      . memantine (NAMENDA) 10 MG tablet Take 10 mg by mouth 2 (two) times daily.      . metoprolol succinate (TOPROL-XL) 25 MG 24 hr tablet Take 1 tablet (25 mg total) by mouth daily.  30 tablet  6  . Multiple Vitamins-Minerals (MULTIVITAMIN WITH MINERALS) tablet Take 1 tablet by mouth daily.      . QUEtiapine (SEROQUEL) 50 MG tablet Take 75 mg by mouth at bedtime.      . sertraline (ZOLOFT) 100 MG tablet Take 100 mg by mouth daily.      Marland Kitchen triamcinolone cream (KENALOG) 0.1 % as needed.       . fluticasone (FLONASE) 50 MCG/ACT nasal spray Two sprays in each nostril daily.  16 g  6    Review of Systems  Constitutional: Negative for fever and chills.  HENT: Positive for congestion, rhinorrhea and postnasal drip.   Eyes: Negative.   Respiratory: Positive for cough. Negative for shortness of breath and wheezing.   Cardiovascular: Negative.   Gastrointestinal: Negative.   Genitourinary: Negative.   Musculoskeletal: Negative.   Skin: Negative.   Neurological: Negative.   Psychiatric/Behavioral: Negative for hallucinations,  behavioral problems and agitation. The patient is not nervous/anxious.     BP 118/68  Pulse 72  Temp 98.7 F (37.1 C) (Oral)  Resp 16  Ht 5\' 1"  (1.549 m)  Wt 109 lb (49.442 kg)  BMI 20.60 kg/m2  SpO2 98%      Objective:   Physical Exam  Constitutional: She appears well-developed and well-nourished. No distress.  HENT:  Right Ear: External ear normal.  Left Ear: External ear normal.  Mouth/Throat: No oropharyngeal exudate.       Pharyngeal erythema. Nasal mucosa injected.  Eyes: Conjunctivae normal are normal.  Neck: Normal range of motion. No tracheal deviation present.  Cardiovascular: Normal rate, regular rhythm and normal heart sounds.   No murmur heard. Pulmonary/Chest: Effort normal. She has no wheezes.       Rhonchi bilateral upper lobes anterior/posterior. Clears with coughing  Musculoskeletal: Normal range of motion.  Lymphadenopathy:    She has no cervical adenopathy.  Neurological: She is alert.  Skin: Skin is warm and dry.  Psychiatric: She has a normal mood and affect. Her behavior is normal.          Assessment & Plan:

## 2012-10-23 ENCOUNTER — Ambulatory Visit: Payer: Medicare Other | Admitting: Adult Health

## 2012-10-23 ENCOUNTER — Telehealth: Payer: Self-pay | Admitting: Internal Medicine

## 2012-10-23 NOTE — Telephone Encounter (Signed)
Pt son called wanting to get ms Wix in asap.  Pt states her ankles are swelling.  Pt has appointment 3/3 with racquel

## 2012-10-27 ENCOUNTER — Ambulatory Visit (INDEPENDENT_AMBULATORY_CARE_PROVIDER_SITE_OTHER): Payer: Self-pay | Admitting: Adult Health

## 2012-10-27 ENCOUNTER — Encounter: Payer: Self-pay | Admitting: Adult Health

## 2012-10-27 VITALS — BP 119/61 | HR 75 | Temp 98.5°F | Resp 16 | Ht 63.0 in | Wt 117.0 lb

## 2012-10-27 DIAGNOSIS — M25473 Effusion, unspecified ankle: Secondary | ICD-10-CM | POA: Insufficient documentation

## 2012-10-27 NOTE — Patient Instructions (Addendum)
  Mrs. Kelli Matthews is doing very well.  There is no swelling around her ankles today. We may see occasional swelling depending on our diet such as consuming foods very high in sodium.   Her lungs are completely clear and she has a regular heart beat and rhythm.  I recommend that Mrs. Kelli Matthews continues her daily activities as she has been doing - going out to eat with her husband, etc.  If swelling becomes worse or persists then we will need to see her again.

## 2012-10-27 NOTE — Progress Notes (Signed)
  Subjective:    Patient ID: Kelli Matthews, female    DOB: 1941-06-02, 72 y.o.   MRN: [id number]  HPI  Patient is a lovely 72 y/o female with hx of dementia who presents to clinic with her caregiver. Caregiver reports that pt's husband was concerned about swelling around pt's ankles. Pt denies cough, sob, cp. Caregiver, who is present with patient every day, has not noted this. She is not able to relay whether these symptoms are occuring later in the day.    Current Outpatient Prescriptions on File Prior to Visit  Medication Sig Dispense Refill  . aspirin EC 81 MG tablet Take 81 mg by mouth daily.      . cholecalciferol (VITAMIN D) 1000 UNITS tablet Take 1,000 Units by mouth daily.      Marland Kitchen desonide (DESOWEN) 0.05 % cream as needed.       . donepezil (ARICEPT) 10 MG tablet Take 10 mg by mouth at bedtime.      . fluticasone (FLONASE) 50 MCG/ACT nasal spray Two sprays in each nostril daily.  16 g  6  . HYDROcodone-homatropine (HYCODAN) 5-1.5 MG/5ML syrup Take 5 mLs by mouth every 8 (eight) hours as needed for cough.  120 mL  0  . lisinopril-hydrochlorothiazide (PRINZIDE,ZESTORETIC) 10-12.5 MG per tablet Take 1 tablet by mouth daily.      . memantine (NAMENDA) 10 MG tablet Take 10 mg by mouth 2 (two) times daily.      . metoprolol succinate (TOPROL-XL) 25 MG 24 hr tablet Take 1 tablet (25 mg total) by mouth daily.  30 tablet  6  . Multiple Vitamins-Minerals (MULTIVITAMIN WITH MINERALS) tablet Take 1 tablet by mouth daily.      . QUEtiapine (SEROQUEL) 50 MG tablet Take 75 mg by mouth at bedtime.      . sertraline (ZOLOFT) 100 MG tablet Take 100 mg by mouth daily.      Marland Kitchen triamcinolone cream (KENALOG) 0.1 % as needed.        No current facility-administered medications on file prior to visit.      Review of Systems  Constitutional: Negative for fever and chills.  Respiratory: Negative for cough and shortness of breath.   Cardiovascular: Positive for leg swelling. Negative for chest pain.     BP 119/61  Pulse 75  Temp(Src) 98.5 F (36.9 C) (Oral)  Resp 16  Ht 5\' 3"  (1.6 m)  Wt 117 lb (53.071 kg)  BMI 20.73 kg/m2  SpO2 99%     Objective:   Physical Exam  Constitutional: She is oriented to person, place, and time. She appears well-developed and well-nourished. No distress.  Cardiovascular: Normal rate, regular rhythm, normal heart sounds and intact distal pulses.  Exam reveals no gallop.   No murmur heard. Pulmonary/Chest: Effort normal and breath sounds normal. No respiratory distress. She has no wheezes. She has no rales.  Musculoskeletal: Normal range of motion. She exhibits no edema.  Neurological: She is alert and oriented to person, place, and time.  Skin: Skin is warm and dry.  Psychiatric: She has a normal mood and affect. Her behavior is normal.        Assessment & Plan:

## 2012-10-27 NOTE — Assessment & Plan Note (Signed)
There is no swelling identified today. Patient is in NAD. Her lungs are clear. Her HR is regular and strong. Excellent peripheral pulses. Suspect this has just been a transient occurrence. Patient eats out with her husband daily. Perhaps she has consumed higher sodium meals and this may have caused some edema. I reviewed her medication list we have for her and I did not note anything that may cause this. Again, there is currently no swelling. RTC if this continues to recur.

## 2012-11-27 ENCOUNTER — Ambulatory Visit: Payer: Medicare Other | Admitting: Internal Medicine

## 2012-12-15 ENCOUNTER — Encounter: Payer: Self-pay | Admitting: Internal Medicine

## 2012-12-15 ENCOUNTER — Ambulatory Visit (INDEPENDENT_AMBULATORY_CARE_PROVIDER_SITE_OTHER): Payer: Medicare Other | Admitting: Internal Medicine

## 2012-12-15 VITALS — BP 118/62 | HR 64 | Temp 98.4°F | Wt 125.0 lb

## 2012-12-15 DIAGNOSIS — I10 Essential (primary) hypertension: Secondary | ICD-10-CM

## 2012-12-15 DIAGNOSIS — F0391 Unspecified dementia with behavioral disturbance: Secondary | ICD-10-CM

## 2012-12-15 LAB — COMPREHENSIVE METABOLIC PANEL
ALT: 14 U/L (ref 0–35)
Albumin: 4.2 g/dL (ref 3.5–5.2)
Alkaline Phosphatase: 92 U/L (ref 39–117)
Glucose, Bld: 76 mg/dL (ref 70–99)
Potassium: 4.1 mEq/L (ref 3.5–5.1)
Sodium: 137 mEq/L (ref 135–145)
Total Bilirubin: 0.3 mg/dL (ref 0.3–1.2)
Total Protein: 7.3 g/dL (ref 6.0–8.3)

## 2012-12-15 NOTE — Assessment & Plan Note (Addendum)
Symptoms well controlled with current medications. Followed by Dr. Maryruth Bun. Will continue to monitor.  Over of which >50% spent in face-to-face contact with patient, caregiver and daughter discussing plan of care

## 2012-12-15 NOTE — Progress Notes (Signed)
Subjective:    Patient ID: Kelli Matthews, female    DOB: 12/07/1940, 72 y.o.   MRN: [id number]  HPI 71YO female with h/o dementia and HTN presents for follow up. Doing well. Has sitter 35hr per week. Son and husband provide remainder of care. Behavioral symptoms well controlled on current meds. Appetite and sleeping improved with Remeron. Followed by Dr. Maryruth Bun.   Continues to have intermittent loose stool. Unchanged from previous. No abdominal pain, blood in stool.   Outpatient Encounter Prescriptions as of 12/15/2012  Medication Sig Dispense Refill  . aspirin EC 81 MG tablet Take 81 mg by mouth daily.      . cholecalciferol (VITAMIN D) 1000 UNITS tablet Take 1,000 Units by mouth daily.      Marland Kitchen desonide (DESOWEN) 0.05 % cream as needed.       . donepezil (ARICEPT) 10 MG tablet Take 10 mg by mouth at bedtime.      . fluticasone (FLONASE) 50 MCG/ACT nasal spray Two sprays in each nostril daily.  16 g  6  . lisinopril-hydrochlorothiazide (PRINZIDE,ZESTORETIC) 10-12.5 MG per tablet Take 1 tablet by mouth daily.      . memantine (NAMENDA) 10 MG tablet Take 10 mg by mouth 2 (two) times daily.      . metoprolol succinate (TOPROL-XL) 25 MG 24 hr tablet Take 1 tablet (25 mg total) by mouth daily.  30 tablet  6  . mirtazapine (REMERON) 15 MG tablet Take 30 mg by mouth at bedtime.       . Multiple Vitamins-Minerals (MULTIVITAMIN WITH MINERALS) tablet Take 1 tablet by mouth daily.      . QUEtiapine (SEROQUEL) 50 MG tablet Take 75 mg by mouth at bedtime.      . triamcinolone cream (KENALOG) 0.1 % as needed.        No facility-administered encounter medications on file as of 12/15/2012.   BP 118/62  Pulse 64  Temp(Src) 98.4 F (36.9 C) (Oral)  Wt 125 lb (56.7 kg)  BMI 22.15 kg/m2  SpO2 98%  Review of Systems  Constitutional: Negative for fever, chills, appetite change, fatigue and unexpected weight change.  HENT: Negative for ear pain, congestion, sore throat, trouble swallowing, neck pain, voice  change and sinus pressure.   Eyes: Negative for visual disturbance.  Respiratory: Negative for cough, shortness of breath, wheezing and stridor.   Cardiovascular: Negative for chest pain, palpitations and leg swelling.  Gastrointestinal: Positive for diarrhea. Negative for nausea, vomiting, abdominal pain, constipation, blood in stool, abdominal distention and anal bleeding.  Genitourinary: Negative for dysuria and flank pain.  Musculoskeletal: Negative for myalgias, arthralgias and gait problem.  Skin: Negative for color change and rash.  Neurological: Negative for dizziness and headaches.  Hematological: Negative for adenopathy. Does not bruise/bleed easily.  Psychiatric/Behavioral: Positive for behavioral problems, confusion and decreased concentration. Negative for suicidal ideas, sleep disturbance and dysphoric mood. The patient is not nervous/anxious.        Objective:   Physical Exam  Constitutional: She is oriented to person, place, and time. She appears well-developed and well-nourished. No distress.  HENT:  Head: Normocephalic and atraumatic.  Right Ear: External ear normal.  Left Ear: External ear normal.  Nose: Nose normal.  Mouth/Throat: Oropharynx is clear and moist. No oropharyngeal exudate.  Eyes: Conjunctivae are normal. Pupils are equal, round, and reactive to light. Right eye exhibits no discharge. Left eye exhibits no discharge. No scleral icterus.  Neck: Normal range of motion. Neck supple. No tracheal deviation present. No  thyromegaly present.  Cardiovascular: Normal rate, regular rhythm, normal heart sounds and intact distal pulses.  Exam reveals no gallop and no friction rub.   No murmur heard. Pulmonary/Chest: Effort normal and breath sounds normal. No accessory muscle usage. Not tachypneic. No respiratory distress. She has no decreased breath sounds. She has no wheezes. She has no rhonchi. She has no rales. She exhibits no tenderness.  Abdominal: Soft. Bowel  sounds are normal. She exhibits no distension and no mass. There is no tenderness. There is no rebound and no guarding.  Musculoskeletal: Normal range of motion. She exhibits no edema and no tenderness.  Lymphadenopathy:    She has no cervical adenopathy.  Neurological: She is alert and oriented to person, place, and time. No cranial nerve deficit. She exhibits normal muscle tone. Coordination normal.  Skin: Skin is warm and dry. No rash noted. She is not diaphoretic. No erythema. No pallor.  Psychiatric: She has a normal mood and affect. Her speech is normal and behavior is normal. Thought content normal. Cognition and memory are impaired. She expresses impulsivity and inappropriate judgment. She exhibits abnormal recent memory and abnormal remote memory.          Assessment & Plan:

## 2012-12-15 NOTE — Assessment & Plan Note (Signed)
BP Readings from Last 3 Encounters:  12/15/12 118/62  10/27/12 119/61  09/09/12 118/68   BP well controlled on current medications. Renal function stable. Will continue to monitor. Follow up 6 months and prn.

## 2012-12-16 ENCOUNTER — Ambulatory Visit: Payer: Self-pay | Admitting: Internal Medicine

## 2013-01-16 ENCOUNTER — Telehealth: Payer: Self-pay | Admitting: *Deleted

## 2013-01-16 NOTE — Telephone Encounter (Signed)
We can try increasing Remeron to 45mg  at bedtime.

## 2013-01-16 NOTE — Telephone Encounter (Signed)
Spoke with patient son Chrissie Noa, stated she is not sleeping at night at all. She is staying up for  24 - 48 hours at a time, would like to know if she you would prescribe her a sedative or something to help her sleep?

## 2013-01-20 NOTE — Telephone Encounter (Signed)
Left message to call back  

## 2013-01-22 NOTE — Telephone Encounter (Signed)
He also called her psychiatrist and she authorized him to increase her Seroquel up to 150 mg if needed. So that is what he has been doing, so far so good per him.

## 2013-01-26 ENCOUNTER — Ambulatory Visit (INDEPENDENT_AMBULATORY_CARE_PROVIDER_SITE_OTHER): Payer: Medicare Other | Admitting: Adult Health

## 2013-01-26 ENCOUNTER — Encounter: Payer: Self-pay | Admitting: *Deleted

## 2013-01-26 ENCOUNTER — Encounter: Payer: Self-pay | Admitting: Adult Health

## 2013-01-26 VITALS — BP 108/56 | HR 64 | Temp 98.6°F | Resp 12 | Wt 127.5 lb

## 2013-01-26 DIAGNOSIS — G479 Sleep disorder, unspecified: Secondary | ICD-10-CM

## 2013-01-26 DIAGNOSIS — R451 Restlessness and agitation: Secondary | ICD-10-CM | POA: Insufficient documentation

## 2013-01-26 DIAGNOSIS — R4182 Altered mental status, unspecified: Secondary | ICD-10-CM

## 2013-01-26 DIAGNOSIS — IMO0002 Reserved for concepts with insufficient information to code with codable children: Secondary | ICD-10-CM

## 2013-01-26 LAB — POCT URINALYSIS DIPSTICK
Bilirubin, UA: NEGATIVE
Blood, UA: NEGATIVE
Ketones, UA: NEGATIVE
Protein, UA: NEGATIVE
Spec Grav, UA: 1.02
pH, UA: 6

## 2013-01-26 MED ORDER — ALPRAZOLAM 0.25 MG PO TABS: ORAL_TABLET | ORAL | Status: DC

## 2013-01-26 NOTE — Patient Instructions (Addendum)
  Continue with the new dose of Seroquel prescribed by Dr. Maryruth Bun.  I have added a small dose of Alprazolam for periods of agitation. This is once a day as needed.  Her urine test did not show any infection.  Please call if her symptoms do not improve within the next week or so.   Consider contacting Zerita Boers with [facility] [phone]

## 2013-01-26 NOTE — Assessment & Plan Note (Addendum)
Increasing periods of agitation. Mostly occurring when she wakes up from a midday nap. Check urinalysis for possible UTI as cause of her symptoms. This was negative. Try alprazolam 0.25 mg daily during these episodes as needed. Recent increase in her Seroquel and patient is responding well. She is sleeping well during the night. Encouraged family to contact dementia care specialist, Zerita Boers, with [facility]. Note, greater than 25 minutes were spent in face-to-face communication with patient and family in the education, counseling and implementation of care.

## 2013-01-26 NOTE — Progress Notes (Signed)
Subjective:    Patient ID: Kelli Matthews, female    DOB: Dec 30, 1940, 72 y.o.   MRN: [id number]  HPI  Patient is a pleasant 72 year old female with history of dementia who presents to clinic with her son. Patient has been having difficulty sleeping during the night. Her psychiatrist, Dr. Maryruth Bun, had increased her Seroquel to 125 mg daily. Son reports that, for the last 4 nights, patient has been sleeping through the night. There are some instances where patient's husband is waking her up at 3:00 in the morning. Son reports that this is occurring on the days that he (husband) goes to dialysis. It is uncertain as to why he is waking up at this time. Also, patient is having periods of agitation during the day. Son reports that these periods have been increasing for the last 3-4 months. It usually occurs when she wakes up from a nap. Most of the time, patient calms down. During the visit, patient was very cooperative. She has good eye contact and maintains the social aspects of conversation.    Current Outpatient Prescriptions on File Prior to Visit  Medication Sig Dispense Refill  . aspirin EC 81 MG tablet Take 81 mg by mouth daily.      . cholecalciferol (VITAMIN D) 1000 UNITS tablet Take 1,000 Units by mouth daily.      Marland Kitchen desonide (DESOWEN) 0.05 % cream as needed.       . donepezil (ARICEPT) 10 MG tablet Take 10 mg by mouth at bedtime.      . fluticasone (FLONASE) 50 MCG/ACT nasal spray Two sprays in each nostril daily.  16 g  6  . lisinopril-hydrochlorothiazide (PRINZIDE,ZESTORETIC) 10-12.5 MG per tablet Take 1 tablet by mouth daily.      . memantine (NAMENDA) 10 MG tablet Take 10 mg by mouth 2 (two) times daily.      . metoprolol succinate (TOPROL-XL) 25 MG 24 hr tablet Take 1 tablet (25 mg total) by mouth daily.  30 tablet  6  . mirtazapine (REMERON) 15 MG tablet Take 30 mg by mouth at bedtime.       . Multiple Vitamins-Minerals (MULTIVITAMIN WITH MINERALS) tablet Take 1 tablet by mouth daily.       . QUEtiapine (SEROQUEL) 50 MG tablet Take 125 mg by mouth at bedtime.       . triamcinolone cream (KENALOG) 0.1 % as needed.        No current facility-administered medications on file prior to visit.      Review of Systems  Constitutional: Negative for fever and chills.  Psychiatric/Behavioral: Positive for behavioral problems, confusion and agitation. The patient is not nervous/anxious.      BP 108/56  Pulse 64  Temp(Src) 98.6 F (37 C) (Oral)  Resp 12  Wt 127 lb 8 oz (57.834 kg)  BMI 22.59 kg/m2  SpO2 96%    Objective:   Physical Exam  Constitutional: She appears well-developed and well-nourished. No distress.  Cardiovascular: Normal rate, regular rhythm, normal heart sounds and intact distal pulses.  Exam reveals no gallop and no friction rub.   No murmur heard. Pulmonary/Chest: Effort normal and breath sounds normal. No respiratory distress. She has no wheezes. She has no rales.  Abdominal: Soft. Bowel sounds are normal.  Neurological: She is alert.  Patient is oriented to person.  Skin: Skin is warm and dry.  Psychiatric: She has a normal mood and affect. Her behavior is normal. Judgment and thought content normal.  Assessment & Plan:

## 2013-01-26 NOTE — Assessment & Plan Note (Signed)
Dr. Maryruth Bun increased Seroquel to 125 mg at bedtime. Patient has slept through the night for the last 4 nights. No additional changes to medications at this time.

## 2013-02-17 ENCOUNTER — Encounter: Payer: Self-pay | Admitting: Internal Medicine

## 2013-03-04 ENCOUNTER — Other Ambulatory Visit: Payer: Self-pay | Admitting: *Deleted

## 2013-03-04 MED ORDER — ALPRAZOLAM 0.25 MG PO TABS: ORAL_TABLET | ORAL | Status: DC

## 2013-03-05 ENCOUNTER — Ambulatory Visit: Payer: Medicare Other | Admitting: Internal Medicine

## 2013-04-13 DIAGNOSIS — Z0279 Encounter for issue of other medical certificate: Secondary | ICD-10-CM

## 2013-04-24 ENCOUNTER — Encounter: Payer: Self-pay | Admitting: *Deleted

## 2013-04-28 ENCOUNTER — Ambulatory Visit (INDEPENDENT_AMBULATORY_CARE_PROVIDER_SITE_OTHER): Payer: Medicare Other | Admitting: *Deleted

## 2013-04-28 ENCOUNTER — Telehealth: Payer: Self-pay | Admitting: Internal Medicine

## 2013-04-28 DIAGNOSIS — Z111 Encounter for screening for respiratory tuberculosis: Secondary | ICD-10-CM

## 2013-04-28 NOTE — Telephone Encounter (Signed)
Kelli Matthews dropped off fl2 , standing order,tb screening, diet, medical history forms  If you have any questions please call Kelli Matthews @ [phone]  Pt will be back Thursday for tb reading and would like to pick up paperwork then if possible In box

## 2013-04-28 NOTE — Telephone Encounter (Signed)
Paperwork placed in your folder.

## 2013-04-30 LAB — TB SKIN TEST
Induration: 0 mm
TB Skin Test: NEGATIVE

## 2013-04-30 NOTE — Telephone Encounter (Signed)
Returned daughter voicemail informing her paperwork is ready to be picked up.

## 2013-04-30 NOTE — Telephone Encounter (Signed)
Patient came in today, PPD read and son given completed paperwork.

## 2013-05-05 ENCOUNTER — Telehealth: Payer: Self-pay | Admitting: Internal Medicine

## 2013-05-05 NOTE — Telephone Encounter (Signed)
Needs hard copy for Xanax.  Also needs paper for 2 creams pt brought in with her, kenalog and desonide.  Celexa was not on FL2, needs Celexa 20 mg mg 1 tab by mouth daily.  Says paperwork was faxed on 9/5, no response.  Please call Eunice Blase [phone] at [facility] when hard copy ready to be picked up.

## 2013-05-06 ENCOUNTER — Telehealth: Payer: Self-pay | Admitting: Internal Medicine

## 2013-05-06 MED ORDER — ALPRAZOLAM 0.25 MG PO TABS: ORAL_TABLET | ORAL | Status: DC

## 2013-05-06 NOTE — Telephone Encounter (Signed)
Debbi called from [facility] and is saying pt is needing refill on Xanax pt is out of meds. She asks if we could call Alex at [phone] when ready and they will come pick it up.

## 2013-05-06 NOTE — Telephone Encounter (Signed)
Could you approve the celexa and print and the 2 creams, so they can pick up tomorrow. Already have script to Xanax done

## 2013-05-06 NOTE — Telephone Encounter (Signed)
Printed on the ledge for signature

## 2013-05-07 NOTE — Telephone Encounter (Signed)
They stated the patient brought a bottle of Celexa with her when she moved in. Informed them we do not have that medication on our list.

## 2013-05-18 ENCOUNTER — Other Ambulatory Visit (INDEPENDENT_AMBULATORY_CARE_PROVIDER_SITE_OTHER): Payer: Medicare Other

## 2013-05-18 ENCOUNTER — Telehealth: Payer: Self-pay | Admitting: *Deleted

## 2013-05-18 DIAGNOSIS — N390 Urinary tract infection, site not specified: Secondary | ICD-10-CM

## 2013-05-18 LAB — POCT URINALYSIS DIPSTICK
Bilirubin, UA: NEGATIVE
Blood, UA: NEGATIVE
Glucose, UA: NEGATIVE
Spec Grav, UA: 1.025
Urobilinogen, UA: 0.2

## 2013-05-18 NOTE — Telephone Encounter (Signed)
I received a urine sample and a paper from [facility] stating "Resident is complaining about having abdominal pain and burning when she urinates, we are requesting order for u/a"

## 2013-05-18 NOTE — Telephone Encounter (Signed)
Do you know why this urine sample was taken?

## 2013-05-18 NOTE — Telephone Encounter (Signed)
Would you like a culture?

## 2013-05-18 NOTE — Telephone Encounter (Signed)
OK. UA was normal, but we can go ahead and send culture. Thanks

## 2013-05-19 LAB — URINE CULTURE: Organism ID, Bacteria: NO GROWTH

## 2013-05-20 ENCOUNTER — Ambulatory Visit (INDEPENDENT_AMBULATORY_CARE_PROVIDER_SITE_OTHER): Payer: Medicare Other | Admitting: *Deleted

## 2013-05-20 DIAGNOSIS — Z111 Encounter for screening for respiratory tuberculosis: Secondary | ICD-10-CM

## 2013-05-22 LAB — TB SKIN TEST
Induration: 0 mm
TB Skin Test: NEGATIVE

## 2013-05-28 ENCOUNTER — Other Ambulatory Visit: Payer: Self-pay | Admitting: Internal Medicine

## 2013-06-03 ENCOUNTER — Ambulatory Visit (INDEPENDENT_AMBULATORY_CARE_PROVIDER_SITE_OTHER): Payer: Medicare Other | Admitting: General Surgery

## 2013-06-03 ENCOUNTER — Telehealth: Payer: Self-pay | Admitting: Internal Medicine

## 2013-06-03 ENCOUNTER — Encounter: Payer: Self-pay | Admitting: General Surgery

## 2013-06-03 ENCOUNTER — Ambulatory Visit: Payer: Self-pay | Admitting: General Surgery

## 2013-06-03 VITALS — BP 130/82 | HR 76 | Resp 16 | Ht 59.0 in | Wt 140.0 lb

## 2013-06-03 DIAGNOSIS — Z86010 Personal history of colonic polyps: Secondary | ICD-10-CM

## 2013-06-03 MED ORDER — POLYETHYLENE GLYCOL 3350 17 GM/SCOOP PO POWD: ORAL | Status: DC

## 2013-06-03 NOTE — Patient Instructions (Signed)
Colonoscopy  A colonoscopy is an exam to evaluate your entire colon. In this exam, your colon is cleansed. A long fiberoptic tube is inserted through your rectum and into your colon. The fiberoptic scope (endoscope) is a long bundle of enclosed and very flexible fibers. These fibers transmit light to the area examined and send images from that area to your caregiver. Discomfort is usually minimal. You may be given a drug to help you sleep (sedative) during or prior to the procedure. This exam helps to detect lumps (tumors), polyps, inflammation, and areas of bleeding. Your caregiver may also take a small piece of tissue (biopsy) that will be examined under a microscope.  LET YOUR CAREGIVER KNOW ABOUT:   · Allergies to food or medicine.  · Medicines taken, including vitamins, herbs, eyedrops, over-the-counter medicines, and creams.  · Use of steroids (by mouth or creams).  · Previous problems with anesthetics or numbing medicines.  · History of bleeding problems or blood clots.  · Previous surgery.  · Other health problems, including diabetes and kidney problems.  · Possibility of pregnancy, if this applies.  BEFORE THE PROCEDURE   · A clear liquid diet may be required for 2 days before the exam.  · Ask your caregiver about changing or stopping your regular medications.  · Liquid injections (enemas) or laxatives may be required.  · A large amount of electrolyte solution may be given to you to drink over a short period of time. This solution is used to clean out your colon.  · You should be present 60 minutes prior to your procedure or as directed by your caregiver.  AFTER THE PROCEDURE   · If you received a sedative or pain relieving medication, you will need to arrange for someone to drive you home.  · Occasionally, there is a little blood passed with the first bowel movement. Do not be concerned.  FINDING OUT THE RESULTS OF YOUR TEST  Not all test results are available during your visit. If your test results are  not back during the visit, make an appointment with your caregiver to find out the results. Do not assume everything is normal if you have not heard from your caregiver or the medical facility. It is important for you to follow up on all of your test results.  HOME CARE INSTRUCTIONS   · It is not unusual to pass moderate amounts of gas and experience mild abdominal cramping following the procedure. This is due to air being used to inflate your colon during the exam. Walking or a warm pack on your belly (abdomen) may help.  · You may resume all normal meals and activities after sedatives and medicines have worn off.  · Only take over-the-counter or prescription medicines for pain, discomfort, or fever as directed by your caregiver. Do not use aspirin or blood thinners if a biopsy was taken. Consult your caregiver for medicine usage if biopsies were taken.  SEEK IMMEDIATE MEDICAL CARE IF:   · You have a fever.  · You pass large blood clots or fill a toilet with blood following the procedure. This may also occur 10 to 14 days following the procedure. This is more likely if a biopsy was taken.  · You develop abdominal pain that keeps getting worse and cannot be relieved with medicine.  Document Released: 08/10/2000 Document Revised: 11/05/2011 Document Reviewed: 03/25/2008  ExitCare® Patient Information ©2014 [employer].

## 2013-06-03 NOTE — Telephone Encounter (Signed)
[facility]am[facility]ks calling to see if pt had received flu shot with [place].  [facility]ks is offering flu shots on Friday.  Also asking if pt has latex allergy.

## 2013-06-03 NOTE — Progress Notes (Signed)
Patient ID: Kelli Matthews, female   DOB: 1940/09/21, 72 y.o.   MRN: [id number]  Chief Complaint  Patient presents with  . Other    colonoscopy    HPI Kelli Matthews is here today for an evaluation for an screening colonoscopy. Patient last colonoscopy was in 2011.  No Gi problems.  The patient is accompanied by her son who was present for the interview and exam.  In the last 1-2 years the patient has had difficulties with dementia. She is now living in an assisted-living facility. HPI  Past Medical History  Diagnosis Date  . Dementia 2012  . Maxillary sinus fracture     after fall 2013, with LOC  . Hypertension 2001    on medicaitons for at least 10 years  . Other esophagitis 2011  . Personal history of tobacco use, presenting hazards to health 2011  . H/O cystitis 2010  . Arthritis 2005  . Rectal mass 2010  . Alzheimer disease     Past Surgical History  Procedure Laterality Date  . Breast reduction surgery  2003  . Breast lumpectomy  1985    Benign per pt, right breast  . Colonoscopy  2004, 2010,2011  . Breast reduction surgery  2003or 2004    by Dr. Genevieve Norlander  . Colon surgery  2010    polyps, Dr. Lemar Livings, hemicolectomy  . Vein ligation and stripping  2005  . Wrist surgery  2005  . Upper gi endoscopy  2011  . Varicose veins  2011    laser surgery    Family History  Problem Relation Age of Onset  . Alzheimer's disease Mother   . Stroke Mother   . Ovarian cancer Sister   . Early death Sister 2  . Depression Son     Social History History  Substance Use Topics  . Smoking status: Former Smoker -- 17 years    Quit date: 08/28/1995  . Smokeless tobacco: Never Used     Comment: quit in 1977  . Alcohol Use: No    Allergies  Allergen Reactions  . Augmentin [Amoxicillin-Pot Clavulanate] Rash  . Latex Rash    Current Outpatient Prescriptions  Medication Sig Dispense Refill  . ALPRAZolam (XANAX) 0.25 MG tablet Take once daily for increased agitation. Take only as  needed.  30 tablet  0  . aspirin EC 81 MG tablet Take 81 mg by mouth daily.      . cholecalciferol (VITAMIN D) 1000 UNITS tablet Take 1,000 Units by mouth daily.      . citalopram (CELEXA) 20 MG tablet Take 1 tablet by mouth daily.      Marland Kitchen desonide (DESOWEN) 0.05 % cream as needed.       . donepezil (ARICEPT) 10 MG tablet Take 10 mg by mouth at bedtime.      . fluticasone (FLONASE) 50 MCG/ACT nasal spray Two sprays in each nostril daily.  16 g  6  . lisinopril-hydrochlorothiazide (PRINZIDE,ZESTORETIC) 10-12.5 MG per tablet Take 1 tablet by mouth daily.      . memantine (NAMENDA) 10 MG tablet Take 10 mg by mouth 2 (two) times daily.      . metoprolol succinate (TOPROL-XL) 25 MG 24 hr tablet Take 1 tablet (25 mg total) by mouth daily.  30 tablet  6  . mirtazapine (REMERON) 15 MG tablet Take 30 mg by mouth at bedtime.       . Multiple Vitamins-Minerals (THERAVIM-M) TABS TAKE ONE TABLET BY MOUTH EACH DAY.  30 tablet  5  .  QUEtiapine (SEROQUEL) 50 MG tablet Take 125 mg by mouth at bedtime.       . triamcinolone cream (KENALOG) 0.1 % as needed.       . polyethylene glycol powder (GLYCOLAX/MIRALAX) powder 255 grams one bottle for colonoscopy prep  255 g  0   No current facility-administered medications for this visit.    Review of Systems Review of Systems  Constitutional: Negative.   Respiratory: Negative.   Cardiovascular: Negative.   Gastrointestinal: Negative.     Blood pressure 130/82, pulse 76, resp. rate 16, height 4\' 11"  (1.499 m), weight 140 lb (63.504 kg).  Physical Exam Physical Exam  Constitutional: She is oriented to person, place, and time. She appears well-developed and well-nourished.  Eyes: No scleral icterus.  Cardiovascular: Normal rate, regular rhythm and normal heart sounds.   Pulmonary/Chest: Breath sounds normal.  Abdominal: Soft. Normal appearance and bowel sounds are normal. There is no tenderness. No hernia.  Lymphadenopathy:    She has no cervical adenopathy.   Neurological: She is alert and oriented to person, place, and time.  Skin: Skin is warm and dry.    Data Reviewed Colonoscopy dated 03/24/2010 showed no evidence of recurrent polyps. Normal ileocolic anastomosis.  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  Assessment    The patient is a candidate for a followup colonoscopy. The patient's son reports that his sister may want to come in from [place] to be present for the exam. They will contact the office when they desire to proceed.    Plan    Patient to be scheduled for a colonoscopy. The patient's son with speak with his sister and then call the office back to arrange a date. It is okay for patient to continue 81 mg aspirin.          Kelli Matthews 06/04/2013, 3:37 PM

## 2013-06-04 ENCOUNTER — Encounter: Payer: Self-pay | Admitting: General Surgery

## 2013-06-04 ENCOUNTER — Telehealth: Payer: Self-pay | Admitting: *Deleted

## 2013-06-04 DIAGNOSIS — Z860100 Personal history of colon polyps, unspecified: Secondary | ICD-10-CM | POA: Insufficient documentation

## 2013-06-04 NOTE — Telephone Encounter (Signed)
Patient's son, Chrissie Noa, called to arrange colonoscopy for 07-14-13 at [facility]. Form has been faxed to [facility] to add to the schedule. They will call the office if they have further questions.

## 2013-06-05 ENCOUNTER — Telehealth: Payer: Self-pay | Admitting: Internal Medicine

## 2013-06-05 NOTE — Telephone Encounter (Signed)
Med list printed and faxed again to [facility]

## 2013-06-05 NOTE — Telephone Encounter (Signed)
Linda from [facility] is calling concerning pt's medication on Seroquel 50 mg tabs. She wasn't sure how much she needed to take.  I told her we have it down at 125mg  before bedtime but she wanted to know if we could fax something over that has the instructions and how much she needs to be taking to be sure they have it right. Fax number [phone]

## 2013-06-05 NOTE — Telephone Encounter (Signed)
After searching for a number to return the call to [facility], I called and spoke with Bonita Quin to inform her the patient did not receive a flu shot in our office.

## 2013-06-19 ENCOUNTER — Encounter: Payer: Self-pay | Admitting: *Deleted

## 2013-06-22 ENCOUNTER — Encounter: Payer: Self-pay | Admitting: Internal Medicine

## 2013-06-22 ENCOUNTER — Ambulatory Visit (INDEPENDENT_AMBULATORY_CARE_PROVIDER_SITE_OTHER): Payer: Medicare Other | Admitting: Internal Medicine

## 2013-06-22 ENCOUNTER — Ambulatory Visit: Payer: Medicare Other | Admitting: Internal Medicine

## 2013-06-22 VITALS — BP 160/82 | HR 68 | Temp 98.2°F | Wt 141.0 lb

## 2013-06-22 DIAGNOSIS — F0391 Unspecified dementia with behavioral disturbance: Secondary | ICD-10-CM

## 2013-06-22 DIAGNOSIS — L738 Other specified follicular disorders: Secondary | ICD-10-CM

## 2013-06-22 DIAGNOSIS — L739 Follicular disorder, unspecified: Secondary | ICD-10-CM | POA: Insufficient documentation

## 2013-06-22 DIAGNOSIS — I10 Essential (primary) hypertension: Secondary | ICD-10-CM

## 2013-06-22 LAB — TSH: TSH: 1.74 u[IU]/mL (ref 0.35–5.50)

## 2013-06-22 LAB — CBC WITH DIFFERENTIAL/PLATELET
Basophils Absolute: 0 10*3/uL (ref 0.0–0.1)
Eosinophils Absolute: 0.2 10*3/uL (ref 0.0–0.7)
Eosinophils Relative: 2.1 % (ref 0.0–5.0)
HCT: 36.6 % (ref 36.0–46.0)
Lymphocytes Relative: 19.6 % (ref 12.0–46.0)
Monocytes Absolute: 0.5 10*3/uL (ref 0.1–1.0)
Monocytes Relative: 6.1 % (ref 3.0–12.0)
Neutro Abs: 5.7 10*3/uL (ref 1.4–7.7)
Neutrophils Relative %: 71.7 % (ref 43.0–77.0)
Platelets: 326 10*3/uL (ref 150.0–400.0)
WBC: 8 10*3/uL (ref 4.5–10.5)

## 2013-06-22 LAB — COMPREHENSIVE METABOLIC PANEL
ALT: 25 U/L (ref 0–35)
BUN: 28 mg/dL — ABNORMAL HIGH (ref 6–23)
CO2: 28 mEq/L (ref 19–32)
Calcium: 9.2 mg/dL (ref 8.4–10.5)
GFR: 73.82 mL/min (ref >60.00)
Glucose, Bld: 93 mg/dL (ref 70–99)
Sodium: 137 mEq/L (ref 135–145)
Total Bilirubin: 0.4 mg/dL (ref 0.3–1.2)
Total Protein: 7 g/dL (ref 6.0–8.3)

## 2013-06-22 NOTE — Progress Notes (Signed)
Subjective:    Patient ID: Kelli Matthews, female    DOB: 20-Oct-1940, 72 y.o.   MRN: [id number]  HPI 72 year old female with history of dementia, hypertension presents for followup. Her son reports she has been doing extremely well at her assisted-living facility. Her psychiatrist recently discontinued alprazolam and started Seroquel as needed but she has not required any doses of this medication. She seems to have a good appetite and has been eating well. No recent episodes of diarrhea, constipation. She was noted to have a rash over her upper back. She denies any pain or itching with this rash. It is unclear how long it has been present. No other new concerns today.  Outpatient Encounter Prescriptions as of 06/22/2013  Medication Sig Dispense Refill  . ALPRAZolam (XANAX) 0.25 MG tablet Take once daily for increased agitation. Take only as needed.  30 tablet  0  . aspirin EC 81 MG tablet Take 81 mg by mouth daily.      . cholecalciferol (VITAMIN D) 1000 UNITS tablet Take 1,000 Units by mouth daily.      . citalopram (CELEXA) 20 MG tablet Take 1 tablet by mouth daily.      Marland Kitchen desonide (DESOWEN) 0.05 % cream as needed.       . donepezil (ARICEPT) 10 MG tablet Take 10 mg by mouth at bedtime.      . fluticasone (FLONASE) 50 MCG/ACT nasal spray Two sprays in each nostril daily.  16 g  6  . lisinopril-hydrochlorothiazide (PRINZIDE,ZESTORETIC) 10-12.5 MG per tablet Take 1 tablet by mouth daily.      . memantine (NAMENDA) 10 MG tablet Take 10 mg by mouth 2 (two) times daily.      . metoprolol succinate (TOPROL-XL) 25 MG 24 hr tablet Take 1 tablet (25 mg total) by mouth daily.  30 tablet  6  . mirtazapine (REMERON) 15 MG tablet Take 30 mg by mouth at bedtime.       . Multiple Vitamins-Minerals (THERAVIM-M) TABS TAKE ONE TABLET BY MOUTH EACH DAY.  30 tablet  5  . QUEtiapine (SEROQUEL) 50 MG tablet Take 125 mg by mouth at bedtime.       . triamcinolone cream (KENALOG) 0.1 % as needed.       . polyethylene  glycol powder (GLYCOLAX/MIRALAX) powder 255 grams one bottle for colonoscopy prep  255 g  0   No facility-administered encounter medications on file as of 06/22/2013.   BP 160/82  Pulse 68  Temp(Src) 98.2 F (36.8 C) (Oral)  Wt 141 lb (63.957 kg)  BMI 28.46 kg/m2  SpO2 97%  Review of Systems  Constitutional: Negative for fever, chills, appetite change, fatigue and unexpected weight change.  HENT: Negative for congestion, ear pain, sinus pressure, sore throat, trouble swallowing and voice change.   Eyes: Negative for visual disturbance.  Respiratory: Negative for cough, shortness of breath, wheezing and stridor.   Cardiovascular: Negative for chest pain, palpitations and leg swelling.  Gastrointestinal: Negative for nausea, vomiting, abdominal pain, diarrhea, constipation, blood in stool, abdominal distention and anal bleeding.  Genitourinary: Negative for dysuria and flank pain.  Musculoskeletal: Negative for arthralgias, gait problem, myalgias and neck pain.  Skin: Negative for color change and rash.  Neurological: Negative for dizziness and headaches.  Hematological: Negative for adenopathy. Does not bruise/bleed easily.  Psychiatric/Behavioral: Positive for behavioral problems, confusion and decreased concentration. Negative for suicidal ideas, sleep disturbance, dysphoric mood and agitation. The patient is not nervous/anxious.        Objective:  Physical Exam  Constitutional: She is oriented to person, place, and time. She appears well-developed and well-nourished. No distress.  HENT:  Head: Normocephalic and atraumatic.  Right Ear: External ear normal.  Left Ear: External ear normal.  Nose: Nose normal.  Mouth/Throat: Oropharynx is clear and moist. No oropharyngeal exudate.  Eyes: Conjunctivae are normal. Pupils are equal, round, and reactive to light. Right eye exhibits no discharge. Left eye exhibits no discharge. No scleral icterus.  Neck: Normal range of motion. Neck  supple. No tracheal deviation present. No thyromegaly present.  Cardiovascular: Normal rate, regular rhythm, normal heart sounds and intact distal pulses.  Exam reveals no gallop and no friction rub.   No murmur heard. Pulmonary/Chest: Effort normal and breath sounds normal. No accessory muscle usage. Not tachypneic. No respiratory distress. She has no decreased breath sounds. She has no wheezes. She has no rhonchi. She has no rales. She exhibits no tenderness.  Abdominal: Soft. Bowel sounds are normal. She exhibits no distension. There is no tenderness.  Musculoskeletal: Normal range of motion. She exhibits no edema and no tenderness.  Lymphadenopathy:    She has no cervical adenopathy.  Neurological: She is alert and oriented to person, place, and time. No cranial nerve deficit. She exhibits normal muscle tone. Coordination normal.  Skin: Skin is warm and dry. Rash noted. Rash is pustular (few scattered lesions upper back, most consistent with folliculitis). She is not diaphoretic. No erythema. No pallor.  Psychiatric: She has a normal mood and affect. Her speech is normal and behavior is normal. Thought content normal. Cognition and memory are impaired. She expresses impulsivity and inappropriate judgment. She exhibits abnormal recent memory and abnormal remote memory.          Assessment & Plan:

## 2013-06-22 NOTE — Assessment & Plan Note (Signed)
Upper back. Mild. Recommended Gentamicin cream applied bid prn. Note sent to [facility].

## 2013-06-22 NOTE — Assessment & Plan Note (Signed)
BP Readings from Last 3 Encounters:  06/22/13 160/82  06/03/13 130/82  01/26/13 108/56   Blood pressure has generally been well-controlled with lisinopril hydrochlorothiazide and metoprolol. Blood pressure slightly elevated today. Will have nursing staff continue to monitor and call if blood pressure consistently greater than 140/90.

## 2013-06-22 NOTE — Assessment & Plan Note (Signed)
Symptoms have been well-controlled on current medications. Note that patient's medications were recently changed and alprazolam was discontinued. Her psychiatrist started Seroquel 25 mg as needed for episodes of agitation. She has not required any doses in the last 2 weeks. Will continue to monitor.

## 2013-06-29 ENCOUNTER — Telehealth: Payer: Self-pay | Admitting: *Deleted

## 2013-06-29 NOTE — Telephone Encounter (Signed)
Patient's POA, [name], contacted the office wanting to reschedule patient's colonoscopy to a Wednesday morning. He will contact Diamantina Monks with the details. Colonoscopy has been moved from 07-14-13 to 09-09-13 at [facility]. Denise in endoscopy has been notified of reschedule.

## 2013-07-01 ENCOUNTER — Telehealth: Payer: Self-pay | Admitting: Internal Medicine

## 2013-07-01 NOTE — Telephone Encounter (Signed)
Debbie called from [facility] to inform the physicain that the patient is being moved to [employer] today.

## 2013-07-01 NOTE — Telephone Encounter (Signed)
Fwd to Dr. Walker 

## 2013-07-02 ENCOUNTER — Other Ambulatory Visit: Payer: Self-pay

## 2013-07-06 ENCOUNTER — Other Ambulatory Visit: Payer: Self-pay | Admitting: Internal Medicine

## 2013-09-02 ENCOUNTER — Telehealth: Payer: Self-pay | Admitting: *Deleted

## 2013-09-02 NOTE — Telephone Encounter (Signed)
Patient's son was contacted today to verify that patient has had no change in medication since last office visit. He does not think that she has but [phone]) and this was confirmed. Son has been instructed to call and pre-register patient by the end of this week since he has not done so already.   Patient's son and [facility] have both been instructed to call the office if they have further questions about prep instructions for colonscopy.   We will proceed with colonoscopy that is scheduled for 09[facility]09-13 at [facility].

## 2013-09-07 ENCOUNTER — Other Ambulatory Visit: Payer: Self-pay | Admitting: General Surgery

## 2013-09-07 DIAGNOSIS — Z86010 Personal history of colonic polyps: Secondary | ICD-10-CM

## 2013-09-08 ENCOUNTER — Inpatient Hospital Stay: Payer: Self-pay | Admitting: Internal Medicine

## 2013-09-08 DIAGNOSIS — K859 Acute pancreatitis without necrosis or infection, unspecified: Secondary | ICD-10-CM | POA: Insufficient documentation

## 2013-09-08 LAB — COMPREHENSIVE METABOLIC PANEL
AST: 253 U/L — AB (ref 15–37)
Albumin: 3.6 g/dL (ref 3.4–5.0)
Alkaline Phosphatase: 115 U/L
Anion Gap: 4 — ABNORMAL LOW (ref 7–16)
BILIRUBIN TOTAL: 0.7 mg/dL (ref 0.2–1.0)
BUN: 20 mg/dL — AB (ref 7–18)
CALCIUM: 9.7 mg/dL (ref 8.5–10.1)
Chloride: 108 mmol/L — ABNORMAL HIGH (ref 98–107)
Co2: 28 mmol/L (ref 21–32)
Creatinine: 1.13 mg/dL (ref 0.60–1.30)
EGFR (African American): 56 — ABNORMAL LOW
GFR CALC NON AF AMER: 49 — AB
Glucose: 124 mg/dL — ABNORMAL HIGH (ref 65–99)
Osmolality: 283 (ref 275–301)
Potassium: 4.7 mmol/L (ref 3.5–5.1)
SGPT (ALT): 107 U/L — ABNORMAL HIGH (ref 12–78)
Sodium: 140 mmol/L (ref 136–145)
Total Protein: 7.4 g/dL (ref 6.4–8.2)

## 2013-09-08 LAB — CBC
HCT: 36.7 % (ref 35.0–47.0)
HGB: 11.9 g/dL — ABNORMAL LOW (ref 12.0–16.0)
MCH: 27.8 pg (ref 26.0–34.0)
MCHC: 32.4 g/dL (ref 32.0–36.0)
MCV: 86 fL (ref 80–100)
Platelet: 285 10*3/uL (ref 150–440)
RBC: 4.28 10*6/uL (ref 3.80–5.20)
RDW: 13.6 % (ref 11.5–14.5)
WBC: 6.1 10*3/uL (ref 3.6–11.0)

## 2013-09-08 LAB — TROPONIN I: Troponin-I: <0.02 ng/mL

## 2013-09-08 LAB — LIPASE, BLOOD: Lipase: 7879 U/L — ABNORMAL HIGH (ref 73–393)

## 2013-09-09 ENCOUNTER — Telehealth: Payer: Self-pay | Admitting: *Deleted

## 2013-09-09 ENCOUNTER — Encounter: Payer: Self-pay | Admitting: General Surgery

## 2013-09-09 DIAGNOSIS — Z86010 Personal history of colonic polyps: Secondary | ICD-10-CM

## 2013-09-09 LAB — LIPASE, BLOOD
LIPASE: 4113 U/L — AB (ref 73–393)
Lipase: 699 U/L — ABNORMAL HIGH (ref 73–393)

## 2013-09-09 LAB — HEPATIC FUNCTION PANEL A (ARMC)
ALT: 103 U/L — AB (ref 12–78)
AST: 147 U/L — AB (ref 15–37)
Albumin: 3.3 g/dL — ABNORMAL LOW (ref 3.4–5.0)
Alkaline Phosphatase: 104 U/L
BILIRUBIN DIRECT: 0.1 mg/dL (ref 0.00–0.20)
BILIRUBIN TOTAL: 0.3 mg/dL (ref 0.2–1.0)
TOTAL PROTEIN: 6.5 g/dL (ref 6.4–8.2)

## 2013-09-09 LAB — LIPID PANEL
CHOLESTEROL: 217 mg/dL — AB (ref 0–200)
HDL Cholesterol: 80 mg/dL — ABNORMAL HIGH (ref 40–60)
Ldl Cholesterol, Calc: 128 mg/dL — ABNORMAL HIGH (ref 0–100)
Triglycerides: 45 mg/dL (ref 0–200)
VLDL Cholesterol, Calc: 9 mg/dL (ref 5–40)

## 2013-09-09 LAB — TSH: Thyroid Stimulating Horm: 4.23 u[IU]/mL

## 2013-09-09 NOTE — Telephone Encounter (Signed)
Per Dr. Lemar LivingsByrnett, patient was admitted to the hospital last night. She was scheduled for a colon[facility] today, [facility]09-[facility].  [facility] and spoke with supervisor, Eunice BlaseDebbie, she states patient completed all but 8-10 oz of prep last night.   Dr. Lemar LivingsByrnett has been Airflighted with this information.

## 2013-09-17 ENCOUNTER — Ambulatory Visit: Payer: Medicare Other | Admitting: Internal Medicine

## 2013-09-23 ENCOUNTER — Encounter: Payer: Self-pay | Admitting: General Surgery

## 2013-10-02 ENCOUNTER — Other Ambulatory Visit: Payer: Self-pay | Admitting: Internal Medicine

## 2013-10-02 MED ORDER — LOPERAMIDE HCL 2 MG PO TABS: ORAL_TABLET | ORAL | Status: DC

## 2013-10-07 ENCOUNTER — Emergency Department: Payer: Self-pay

## 2013-10-07 LAB — URINALYSIS, COMPLETE
BACTERIA: NONE SEEN
Bilirubin,UR: NEGATIVE
Blood: NEGATIVE
GLUCOSE, UR: NEGATIVE mg/dL (ref 0–75)
Ketone: NEGATIVE
LEUKOCYTE ESTERASE: NEGATIVE
Nitrite: NEGATIVE
Ph: 6 (ref 4.5–8.0)
Protein: NEGATIVE
Specific Gravity: 1.014 (ref 1.003–1.030)
Squamous Epithelial: <1
WBC UR: <1 /HPF (ref 0–5)

## 2013-10-07 LAB — COMPREHENSIVE METABOLIC PANEL
ALBUMIN: 3.5 g/dL (ref 3.4–5.0)
Alkaline Phosphatase: 109 U/L
Anion Gap: 8 (ref 7–16)
BILIRUBIN TOTAL: 0.3 mg/dL (ref 0.2–1.0)
BUN: 32 mg/dL — ABNORMAL HIGH (ref 7–18)
CHLORIDE: 101 mmol/L (ref 98–107)
Calcium, Total: 8.7 mg/dL (ref 8.5–10.1)
Co2: 26 mmol/L (ref 21–32)
Creatinine: 0.93 mg/dL (ref 0.60–1.30)
EGFR (African American): >60
Glucose: 95 mg/dL (ref 65–99)
Osmolality: 277 (ref 275–301)
POTASSIUM: 4.4 mmol/L (ref 3.5–5.1)
SGOT(AST): 65 U/L — ABNORMAL HIGH (ref 15–37)
SGPT (ALT): 45 U/L (ref 12–78)
Sodium: 135 mmol/L — ABNORMAL LOW (ref 136–145)
Total Protein: 7.5 g/dL (ref 6.4–8.2)

## 2013-10-07 LAB — CBC WITH DIFFERENTIAL/PLATELET
BASOS ABS: 0.1 10*3/uL (ref 0.0–0.1)
Basophil %: 0.9 %
EOS ABS: 0.2 10*3/uL (ref 0.0–0.7)
EOS PCT: 2.6 %
HCT: 36.6 % (ref 35.0–47.0)
HGB: 11.9 g/dL — ABNORMAL LOW (ref 12.0–16.0)
Lymphocyte #: 1.8 10*3/uL (ref 1.0–3.6)
Lymphocyte %: 23.1 %
MCH: 27.9 pg (ref 26.0–34.0)
MCHC: 32.4 g/dL (ref 32.0–36.0)
MCV: 86 fL (ref 80–100)
MONO ABS: 0.7 x10 3/mm (ref 0.2–0.9)
Monocyte %: 9.7 %
NEUTROS ABS: 4.9 10*3/uL (ref 1.4–6.5)
NEUTROS PCT: 63.7 %
Platelet: 279 10*3/uL (ref 150–440)
RBC: 4.26 10*6/uL (ref 3.80–5.20)
RDW: 13.8 % (ref 11.5–14.5)
WBC: 7.6 10*3/uL (ref 3.6–11.0)

## 2013-10-07 LAB — TROPONIN I: Troponin-I: <0.02 ng/mL

## 2013-10-08 ENCOUNTER — Emergency Department: Payer: Self-pay | Admitting: Internal Medicine

## 2013-10-09 ENCOUNTER — Emergency Department: Payer: Self-pay | Admitting: Emergency Medicine

## 2013-10-09 LAB — URINALYSIS, COMPLETE
BILIRUBIN, UR: NEGATIVE
BLOOD: NEGATIVE
Glucose,UR: NEGATIVE mg/dL (ref 0–75)
Hyaline Cast: 31
Ketone: NEGATIVE
LEUKOCYTE ESTERASE: NEGATIVE
NITRITE: NEGATIVE
PH: 5 (ref 4.5–8.0)
PROTEIN: NEGATIVE
RBC,UR: 1 /HPF (ref 0–5)
Specific Gravity: 1.018 (ref 1.003–1.030)
Squamous Epithelial: NONE SEEN

## 2013-10-09 LAB — CBC WITH DIFFERENTIAL/PLATELET
Basophil #: 0.1 10*3/uL (ref 0.0–0.1)
Basophil %: 1.1 %
Eosinophil #: 0.2 10*3/uL (ref 0.0–0.7)
Eosinophil %: 2.4 %
HCT: 36.7 % (ref 35.0–47.0)
HGB: 12.2 g/dL (ref 12.0–16.0)
LYMPHS PCT: 12.2 %
Lymphocyte #: 1.2 10*3/uL (ref 1.0–3.6)
MCH: 28.7 pg (ref 26.0–34.0)
MCHC: 33.4 g/dL (ref 32.0–36.0)
MCV: 86 fL (ref 80–100)
Monocyte #: 0.7 x10 3/mm (ref 0.2–0.9)
Monocyte %: 7.4 %
NEUTROS PCT: 76.9 %
Neutrophil #: 7.6 10*3/uL — ABNORMAL HIGH (ref 1.4–6.5)
PLATELETS: 240 10*3/uL (ref 150–440)
RBC: 4.26 10*6/uL (ref 3.80–5.20)
RDW: 13.8 % (ref 11.5–14.5)
WBC: 10 10*3/uL (ref 3.6–11.0)

## 2013-10-09 LAB — COMPREHENSIVE METABOLIC PANEL
AST: 35 U/L (ref 15–37)
Albumin: 3.5 g/dL (ref 3.4–5.0)
Alkaline Phosphatase: 144 U/L — ABNORMAL HIGH
Anion Gap: 8 (ref 7–16)
BUN: 39 mg/dL — ABNORMAL HIGH (ref 7–18)
Bilirubin,Total: 0.2 mg/dL (ref 0.2–1.0)
CREATININE: 1.17 mg/dL (ref 0.60–1.30)
Calcium, Total: 8.6 mg/dL (ref 8.5–10.1)
Chloride: 104 mmol/L (ref 98–107)
Co2: 27 mmol/L (ref 21–32)
GFR CALC AF AMER: 54 — AB
GFR CALC NON AF AMER: 47 — AB
Glucose: 88 mg/dL (ref 65–99)
Osmolality: 286 (ref 275–301)
Potassium: 3.4 mmol/L — ABNORMAL LOW (ref 3.5–5.1)
SGPT (ALT): 40 U/L (ref 12–78)
Sodium: 139 mmol/L (ref 136–145)
Total Protein: 7.3 g/dL (ref 6.4–8.2)

## 2013-10-09 LAB — TSH: Thyroid Stimulating Horm: 2.31 u[IU]/mL

## 2013-10-09 LAB — TROPONIN I

## 2013-10-11 ENCOUNTER — Emergency Department: Payer: Self-pay | Admitting: Emergency Medicine

## 2013-10-19 ENCOUNTER — Other Ambulatory Visit: Payer: Self-pay | Admitting: Internal Medicine

## 2013-10-19 ENCOUNTER — Telehealth: Payer: Self-pay | Admitting: Emergency Medicine

## 2013-10-19 NOTE — Telephone Encounter (Signed)
Patients son needed to cancel apt on 2/27, pt has broken her ankle. She is seeing quite a few doctors lately. I informed him this was her hosp f/u apt. He is wondering if we can put him someone on the schedule for 11/02/13 because they will already be out and about for another apt and having her out already will be easier. They will see ortho here in town on 11/02/13 at 11am. Please advise

## 2013-10-19 NOTE — Telephone Encounter (Signed)
Refill

## 2013-10-23 ENCOUNTER — Ambulatory Visit: Payer: Medicare Other | Admitting: Internal Medicine

## 2013-10-23 NOTE — Telephone Encounter (Signed)
That is fine 

## 2013-10-23 NOTE — Telephone Encounter (Signed)
Fwd to Dr. Walker 

## 2013-10-26 NOTE — Telephone Encounter (Signed)
Spoke with patient son Chrissie NoaWilliam, he will just keep the appointment that was scheduled for 3/11.

## 2013-10-26 NOTE — Telephone Encounter (Signed)
I do not see any openings on your schedule for 3/9

## 2013-10-26 NOTE — Telephone Encounter (Signed)
OK. Then we can put her in on 3/10

## 2013-11-04 ENCOUNTER — Ambulatory Visit (INDEPENDENT_AMBULATORY_CARE_PROVIDER_SITE_OTHER): Payer: Medicare Other | Admitting: Internal Medicine

## 2013-11-04 ENCOUNTER — Encounter: Payer: Self-pay | Admitting: Internal Medicine

## 2013-11-04 ENCOUNTER — Telehealth: Payer: Self-pay | Admitting: Internal Medicine

## 2013-11-04 VITALS — BP 110/70 | HR 77

## 2013-11-04 DIAGNOSIS — I10 Essential (primary) hypertension: Secondary | ICD-10-CM

## 2013-11-04 DIAGNOSIS — F03918 Unspecified dementia, unspecified severity, with other behavioral disturbance: Secondary | ICD-10-CM

## 2013-11-04 DIAGNOSIS — R197 Diarrhea, unspecified: Secondary | ICD-10-CM

## 2013-11-04 DIAGNOSIS — K529 Noninfective gastroenteritis and colitis, unspecified: Secondary | ICD-10-CM

## 2013-11-04 DIAGNOSIS — S82891A Other fracture of right lower leg, initial encounter for closed fracture: Secondary | ICD-10-CM

## 2013-11-04 DIAGNOSIS — F0391 Unspecified dementia with behavioral disturbance: Secondary | ICD-10-CM

## 2013-11-04 DIAGNOSIS — S82899A Other fracture of unspecified lower leg, initial encounter for closed fracture: Secondary | ICD-10-CM

## 2013-11-04 LAB — CBC WITH DIFFERENTIAL/PLATELET
BASOS ABS: 0 10*3/uL (ref 0.0–0.1)
Basophils Relative: 0.7 % (ref 0.0–3.0)
EOS ABS: 0.3 10*3/uL (ref 0.0–0.7)
Eosinophils Relative: 3.8 % (ref 0.0–5.0)
HEMATOCRIT: 33.1 % — AB (ref 36.0–46.0)
HEMOGLOBIN: 10.9 g/dL — AB (ref 12.0–15.0)
LYMPHS ABS: 1.4 10*3/uL (ref 0.7–4.0)
Lymphocytes Relative: 20.6 % (ref 12.0–46.0)
MCHC: 32.9 g/dL (ref 30.0–36.0)
MCV: 84.7 fl (ref 78.0–100.0)
MONO ABS: 0.6 10*3/uL (ref 0.1–1.0)
Monocytes Relative: 9.5 % (ref 3.0–12.0)
NEUTROS ABS: 4.4 10*3/uL (ref 1.4–7.7)
Neutrophils Relative %: 65.4 % (ref 43.0–77.0)
Platelets: 388 10*3/uL (ref 150.0–400.0)
RBC: 3.91 Mil/uL (ref 3.87–5.11)
RDW: 14.2 % (ref 11.5–14.6)
WBC: 6.8 10*3/uL (ref 4.5–10.5)

## 2013-11-04 LAB — COMPREHENSIVE METABOLIC PANEL
ALBUMIN: 3.8 g/dL (ref 3.5–5.2)
ALT: 33 U/L (ref 0–35)
AST: 40 U/L — ABNORMAL HIGH (ref 0–37)
Alkaline Phosphatase: 116 U/L (ref 39–117)
BUN: 28 mg/dL — AB (ref 6–23)
CO2: 25 mEq/L (ref 19–32)
Calcium: 9 mg/dL (ref 8.4–10.5)
Chloride: 102 mEq/L (ref 96–112)
Creatinine, Ser: 1 mg/dL (ref 0.4–1.2)
GFR: 59.9 mL/min — AB (ref >60.00)
GLUCOSE: 89 mg/dL (ref 70–99)
POTASSIUM: 3.2 meq/L — AB (ref 3.5–5.1)
Sodium: 136 mEq/L (ref 135–145)
TOTAL PROTEIN: 7.1 g/dL (ref 6.0–8.3)
Total Bilirubin: 0.5 mg/dL (ref 0.3–1.2)

## 2013-11-04 NOTE — Patient Instructions (Signed)
Please obtain stool culture for testing.  We will set up evaluation with GI physician.

## 2013-11-04 NOTE — Telephone Encounter (Signed)
Relevant patient education assigned to patient using Emmi. ° °

## 2013-11-04 NOTE — Progress Notes (Signed)
Pre visit review using our clinic review tool, if applicable. No additional management support is needed unless otherwise documented below in the visit note. 

## 2013-11-04 NOTE — Progress Notes (Signed)
Subjective:    Patient ID: Kelli Matthews, female    DOB: 1941-08-12, 73 y.o.   MRN: [id number]  HPI 73YO female with h/o dementia and hypertension presents for follow up.  Recently evaluated  at [facility] 2/15 for broken ankle. Friday 2/13 she was alone and fell. Unsure how fracture occurred. Casted on 2/20 by Dr. Earnestine Leys. Plans for follow up 3/23. No complaints of pain. Using wheelchair to get around.  Continues to have frequent loose stools. Taking Immodium with some improvement. No fever, chills. No complaints of abdominal pain. Previous evaluation including stool culture, stool testing for Cdiff and colonoscopy have been normal.  Outpatient Encounter Prescriptions as of 11/04/2013  Medication Sig  . aspirin EC 81 MG tablet Take 81 mg by mouth daily.  . cholecalciferol (VITAMIN D) 1000 UNITS tablet Take 1,000 Units by mouth daily.  . citalopram (CELEXA) 20 MG tablet Take 1 tablet by mouth daily.  Marland Kitchen desonide (DESOWEN) 0.05 % cream as needed.   . donepezil (ARICEPT) 10 MG tablet TAKE ONE TABLET BY MOUTH EACH DAY AT BEDTIME.  . fluticasone (FLONASE) 50 MCG/ACT nasal spray Two sprays in each nostril daily.  Marland Kitchen gentamicin cream (GARAMYCIN) 0.1 % APPLY TWICE DAILY TO SKIN LESIONS ON UPPER BACK  . lisinopril-hydrochlorothiazide (PRINZIDE,ZESTORETIC) 10-12.5 MG per tablet Take 1 tablet by mouth daily.  Marland Kitchen loperamide (IMODIUM A-D) 2 MG tablet Take 1 tablet by mouth after each loose stool. (Maximum 3 every 12 hours) If problem persist after 12 hours contact MD  . memantine (NAMENDA) 10 MG tablet Take 10 mg by mouth 2 (two) times daily.  . metoprolol succinate (TOPROL-XL) 25 MG 24 hr tablet Take 1 tablet (25 mg total) by mouth daily.  . mirtazapine (REMERON) 15 MG tablet Take 30 mg by mouth at bedtime.   . Multiple Vitamins-Minerals (THERAVIM-M) TABS TAKE ONE TABLET BY MOUTH EACH DAY.  Marland Kitchen polyethylene glycol powder (GLYCOLAX/MIRALAX) powder 255 grams one bottle for colonoscopy prep  . QUEtiapine  (SEROQUEL) 50 MG tablet Take 125 mg by mouth at bedtime.   . triamcinolone cream (KENALOG) 0.1 % as needed.   . ALPRAZolam (XANAX) 0.25 MG tablet Take once daily for increased agitation. Take only as needed.      Review of Systems  Constitutional: Negative for fever, chills, appetite change, fatigue and unexpected weight change.  HENT: Negative for congestion, ear pain, sinus pressure, sore throat, trouble swallowing and voice change.   Eyes: Negative for visual disturbance.  Respiratory: Negative for cough, shortness of breath, wheezing and stridor.   Cardiovascular: Negative for chest pain, palpitations and leg swelling.  Gastrointestinal: Positive for diarrhea. Negative for nausea, vomiting, abdominal pain, constipation, blood in stool, abdominal distention and anal bleeding.  Genitourinary: Negative for dysuria and flank pain.  Musculoskeletal: Negative for arthralgias, gait problem, myalgias and neck pain.  Skin: Negative for color change and rash.  Neurological: Negative for dizziness and headaches.  Hematological: Negative for adenopathy. Does not bruise/bleed easily.  Psychiatric/Behavioral: Positive for confusion and decreased concentration. Negative for suicidal ideas, sleep disturbance and dysphoric mood. The patient is not nervous/anxious.        Objective:    BP 110/70  Pulse 77  SpO2 96% Physical Exam  Constitutional: She is oriented to person, place, and time. She appears well-developed and well-nourished. No distress.  HENT:  Head: Normocephalic and atraumatic.  Right Ear: External ear normal.  Left Ear: External ear normal.  Nose: Nose normal.  Mouth/Throat: Oropharynx is clear and moist. No  oropharyngeal exudate.  Eyes: Conjunctivae are normal. Pupils are equal, round, and reactive to light. Right eye exhibits no discharge. Left eye exhibits no discharge. No scleral icterus.  Neck: Normal range of motion. Neck supple. No tracheal deviation present. No  thyromegaly present.  Cardiovascular: Normal rate, regular rhythm, normal heart sounds and intact distal pulses.  Exam reveals no gallop and no friction rub.   No murmur heard. Pulmonary/Chest: Effort normal and breath sounds normal. No accessory muscle usage. Not tachypneic. No respiratory distress. She has no decreased breath sounds. She has no wheezes. She has no rhonchi. She has no rales. She exhibits no tenderness.  Abdominal: Soft. Bowel sounds are normal. She exhibits no distension and no mass. There is no tenderness. There is no rebound and no guarding.  Musculoskeletal: Normal range of motion. She exhibits no edema and no tenderness.  Lymphadenopathy:    She has no cervical adenopathy.  Neurological: She is alert and oriented to person, place, and time. No cranial nerve deficit. She exhibits normal muscle tone. Coordination normal.  Skin: Skin is warm and dry. No rash noted. She is not diaphoretic. No erythema. No pallor.  Psychiatric: She has a normal mood and affect. Thought content normal. She is withdrawn. Cognition and memory are impaired. She expresses impulsivity and inappropriate judgment. She exhibits abnormal recent memory and abnormal remote memory.          Assessment & Plan:   Problem List Items Addressed This Visit   Ankle fracture, right     Will request notes from Dr. Sabra Heck. Continue orthopedic care. Pt denies pain today. Has standing order through SNF for prn Tylenol.    Chronic diarrhea - Primary     Chronic watery diarrhea. Previous evaluation with stool culture and Cdiff testing in 2013 were negative, will repeat today. Reviewed recent colonoscopy with Dr. Bary Castilla which was normal. If cultures are negative and symptoms persist, will set up evaluation with GI. Continue prn Immodium.    Relevant Orders      Stool C-Diff Toxin Assay      Stool Culture      Comp Met (CMET) (Completed)      CBC w/Diff (Completed)      Ambulatory referral to Gastroenterology    Dementia with behavioral disturbance     Symptoms have been stable in the interim since her last visit. She continues to live in SNF. No changes to medications made today.    Hypertension      BP Readings from Last 3 Encounters:  11/04/13 110/70  06/22/13 160/82  06/03/13 130/82   BP well controlled on Metoprolol and lisionpril-HCTZ. Will continue.        Return in about 4 weeks (around 12/02/2013).

## 2013-11-05 DIAGNOSIS — K529 Noninfective gastroenteritis and colitis, unspecified: Secondary | ICD-10-CM | POA: Insufficient documentation

## 2013-11-05 DIAGNOSIS — S82891A Other fracture of right lower leg, initial encounter for closed fracture: Secondary | ICD-10-CM | POA: Insufficient documentation

## 2013-11-05 NOTE — Assessment & Plan Note (Signed)
Symptoms have been stable in the interim since her last visit. She continues to live in SNF. No changes to medications made today.

## 2013-11-05 NOTE — Assessment & Plan Note (Signed)
Chronic watery diarrhea. Previous evaluation with stool culture and Cdiff testing in 2013 were negative, will repeat today. Reviewed recent colonoscopy with Dr. Lemar LivingsByrnett which was normal. If cultures are negative and symptoms persist, will set up evaluation with GI. Continue prn Immodium.

## 2013-11-05 NOTE — Assessment & Plan Note (Signed)
BP Readings from Last 3 Encounters:  11/04/13 110/70  06/22/13 160/82  06/03/13 130/82   BP well controlled on Metoprolol and lisionpril-HCTZ. Will continue.

## 2013-11-05 NOTE — Assessment & Plan Note (Signed)
Will request notes from Dr. Hyacinth MeekerMiller. Continue orthopedic care. Pt denies pain today. Has standing order through SNF for prn Tylenol.

## 2013-11-09 ENCOUNTER — Encounter: Payer: Self-pay | Admitting: *Deleted

## 2013-11-10 LAB — CLOSTRIDIUM DIFFICILE EIA: CDIFTX: NEGATIVE

## 2013-11-12 ENCOUNTER — Telehealth: Payer: Self-pay | Admitting: Internal Medicine

## 2013-11-12 NOTE — Telephone Encounter (Signed)
Calling for the patient's stool results. Please call when all the patient's results are in.

## 2013-11-13 ENCOUNTER — Encounter: Payer: Self-pay | Admitting: Emergency Medicine

## 2013-11-13 LAB — STOOL CULTURE

## 2013-11-13 NOTE — Telephone Encounter (Signed)
Are there results back yet?

## 2013-11-13 NOTE — Telephone Encounter (Signed)
Debbie notified. 

## 2013-11-13 NOTE — Telephone Encounter (Signed)
I sent them an email today. The stool culture was negative.

## 2013-11-18 ENCOUNTER — Other Ambulatory Visit: Payer: Medicare Other

## 2013-11-19 ENCOUNTER — Other Ambulatory Visit: Payer: Self-pay | Admitting: *Deleted

## 2013-11-19 ENCOUNTER — Other Ambulatory Visit (INDEPENDENT_AMBULATORY_CARE_PROVIDER_SITE_OTHER): Payer: Medicare Other

## 2013-11-19 DIAGNOSIS — Z1211 Encounter for screening for malignant neoplasm of colon: Secondary | ICD-10-CM

## 2013-11-19 LAB — FECAL OCCULT BLOOD, IMMUNOCHEMICAL: Fecal Occult Bld: POSITIVE — AB

## 2013-11-26 ENCOUNTER — Telehealth: Payer: Self-pay | Admitting: *Deleted

## 2013-11-26 ENCOUNTER — Other Ambulatory Visit: Payer: Self-pay | Admitting: *Deleted

## 2013-11-26 DIAGNOSIS — D649 Anemia, unspecified: Secondary | ICD-10-CM

## 2013-11-26 NOTE — Telephone Encounter (Signed)
Pt is coming in on Monday what labs and dx? 

## 2013-11-26 NOTE — Telephone Encounter (Signed)
CBC anemia 

## 2013-11-30 ENCOUNTER — Other Ambulatory Visit (INDEPENDENT_AMBULATORY_CARE_PROVIDER_SITE_OTHER): Payer: Medicare Other

## 2013-11-30 DIAGNOSIS — D649 Anemia, unspecified: Secondary | ICD-10-CM

## 2013-11-30 LAB — CBC WITH DIFFERENTIAL/PLATELET
BASOS ABS: 0.1 10*3/uL (ref 0.0–0.1)
Basophils Relative: 1.6 % (ref 0.0–3.0)
Eosinophils Absolute: 0.2 10*3/uL (ref 0.0–0.7)
Eosinophils Relative: 3.1 % (ref 0.0–5.0)
HCT: 33.3 % — ABNORMAL LOW (ref 36.0–46.0)
Hemoglobin: 10.8 g/dL — ABNORMAL LOW (ref 12.0–15.0)
LYMPHS PCT: 25 % (ref 12.0–46.0)
Lymphs Abs: 1.7 10*3/uL (ref 0.7–4.0)
MCHC: 32.4 g/dL (ref 30.0–36.0)
MCV: 84.5 fl (ref 78.0–100.0)
Monocytes Absolute: 0.5 10*3/uL (ref 0.1–1.0)
Monocytes Relative: 7.1 % (ref 3.0–12.0)
NEUTROS PCT: 63.2 % (ref 43.0–77.0)
Neutro Abs: 4.2 10*3/uL (ref 1.4–7.7)
Platelets: 381 10*3/uL (ref 150.0–400.0)
RBC: 3.94 Mil/uL (ref 3.87–5.11)
RDW: 14.6 % (ref 11.5–14.6)
WBC: 6.6 10*3/uL (ref 4.5–10.5)

## 2013-12-22 ENCOUNTER — Encounter: Payer: Medicare Other | Admitting: Internal Medicine

## 2013-12-31 ENCOUNTER — Encounter: Payer: Self-pay | Admitting: Internal Medicine

## 2013-12-31 ENCOUNTER — Ambulatory Visit (INDEPENDENT_AMBULATORY_CARE_PROVIDER_SITE_OTHER): Payer: Medicare Other | Admitting: Internal Medicine

## 2013-12-31 VITALS — BP 136/62 | HR 70 | Temp 98.9°F | Wt 139.0 lb

## 2013-12-31 DIAGNOSIS — K529 Noninfective gastroenteritis and colitis, unspecified: Secondary | ICD-10-CM

## 2013-12-31 DIAGNOSIS — Z23 Encounter for immunization: Secondary | ICD-10-CM

## 2013-12-31 DIAGNOSIS — R197 Diarrhea, unspecified: Secondary | ICD-10-CM

## 2013-12-31 DIAGNOSIS — S82899A Other fracture of unspecified lower leg, initial encounter for closed fracture: Secondary | ICD-10-CM

## 2013-12-31 DIAGNOSIS — F03918 Unspecified dementia, unspecified severity, with other behavioral disturbance: Secondary | ICD-10-CM

## 2013-12-31 DIAGNOSIS — I10 Essential (primary) hypertension: Secondary | ICD-10-CM

## 2013-12-31 DIAGNOSIS — D649 Anemia, unspecified: Secondary | ICD-10-CM

## 2013-12-31 DIAGNOSIS — F0391 Unspecified dementia with behavioral disturbance: Secondary | ICD-10-CM

## 2013-12-31 DIAGNOSIS — S82891A Other fracture of right lower leg, initial encounter for closed fracture: Secondary | ICD-10-CM

## 2013-12-31 NOTE — Progress Notes (Signed)
   Subjective:    Patient ID: Kelli Matthews, female    DOB: [birth year], 73 y.o.   MRN: [id number]  HPI 73YO female with dementia and chronic diarrhea presents for follow up.  Diarrhea - Seen by Dr. Tiffany Kocher. Started on Metamucil. Unsure if labs were completed. Diarrhea has improved.  Dementia - Recently seen by Dr. Nicolasa Ducking. No changes made to medication.  Right ankle fracture - Recently changed to air cast. Tolerating well. Started PT.  Denies pain.  Review of Systems  Unable to perform ROS Denies any pain.    Objective:    BP 136/62  Pulse 70  Temp(Src) 98.9 F (37.2 C) (Oral)  Wt 139 lb (63.05 kg)  SpO2 96% Physical Exam  Constitutional: She is oriented to person, place, and time. She appears well-developed and well-nourished. No distress.  HENT:  Head: Normocephalic and atraumatic.  Right Ear: External ear normal.  Left Ear: External ear normal.  Nose: Nose normal.  Mouth/Throat: Oropharynx is clear and moist. No oropharyngeal exudate.  Eyes: Conjunctivae are normal. Pupils are equal, round, and reactive to light. Right eye exhibits no discharge. Left eye exhibits no discharge. No scleral icterus.  Neck: Normal range of motion. Neck supple. No tracheal deviation present. No thyromegaly present.  Cardiovascular: Normal rate, regular rhythm, normal heart sounds and intact distal pulses.  Exam reveals no gallop and no friction rub.   No murmur heard. Pulmonary/Chest: Effort normal and breath sounds normal. No accessory muscle usage. Not tachypneic. No respiratory distress. She has no decreased breath sounds. She has no wheezes. She has no rhonchi. She has no rales. She exhibits no tenderness.  Musculoskeletal: Normal range of motion. She exhibits no edema and no tenderness.       Feet:  Lymphadenopathy:    She has no cervical adenopathy.  Neurological: She is alert and oriented to person, place, and time. No cranial nerve deficit. She exhibits normal muscle tone. Coordination  normal.  Skin: Skin is warm and dry. No rash noted. She is not diaphoretic. No erythema. No pallor.  Psychiatric: She has a normal mood and affect. Her behavior is normal. Thought content normal. Her speech is delayed. Cognition and memory are impaired. She expresses impulsivity and inappropriate judgment. She exhibits abnormal recent memory and abnormal remote memory.          Assessment & Plan:   Problem List Items Addressed This Visit   Anemia     Recent anemia noted on labs. Stools guaiac pos. S/p GI evaluation. Will request notes on this. Will recheck CBC with labs today.    Ankle fracture, right     Pt denies pain. Has transitioned to air cast. Tolerating well. No recent falls. Will continue to follow with Dr. Sabra Heck.    Chronic diarrhea - Primary     Symptoms have improved with Metamucil. Will request recent notes from GI.    Relevant Orders      Comp Met (CMET)      CBC w/Diff   Dementia with behavioral disturbance     Symptoms relatively stable. Continue to follow with Dr. Nicolasa Ducking and continue current medications.    Hypertension      BP Readings from Last 3 Encounters:  12/31/13 136/62  11/04/13 110/70  06/22/13 160/82   BP well controlled on current medications. Will check renal function with labs.        Return in about 3 months (around 04/02/2014) for Recheck.

## 2013-12-31 NOTE — Assessment & Plan Note (Signed)
BP Readings from Last 3 Encounters:  12/31/13 136/62  11/04/13 110/70  06/22/13 160/82   BP well controlled on current medications. Will check renal function with labs.

## 2013-12-31 NOTE — Assessment & Plan Note (Signed)
Symptoms relatively stable. Continue to follow with Dr. Maryruth BunKapur and continue current medications.

## 2013-12-31 NOTE — Progress Notes (Signed)
Pre visit review using our clinic review tool, if applicable. No additional management support is needed unless otherwise documented below in the visit note. 

## 2013-12-31 NOTE — Assessment & Plan Note (Signed)
Symptoms have improved with Metamucil. Will request recent notes from GI.

## 2013-12-31 NOTE — Addendum Note (Signed)
Addended by: Marchia MeiersEASTWOOD, Jessalynn Mccowan M on: 12/31/2013 04:58 PM   Modules accepted: Orders

## 2013-12-31 NOTE — Assessment & Plan Note (Signed)
Recent anemia noted on labs. Stools guaiac pos. S/p GI evaluation. Will request notes on this. Will recheck CBC with labs today.

## 2013-12-31 NOTE — Assessment & Plan Note (Signed)
Pt denies pain. Has transitioned to air cast. Tolerating well. No recent falls. Will continue to follow with Dr. Hyacinth MeekerMiller.

## 2014-01-01 LAB — COMPREHENSIVE METABOLIC PANEL
ALT: 25 U/L (ref 0–35)
AST: 26 U/L (ref 0–37)
Albumin: 3.9 g/dL (ref 3.5–5.2)
Alkaline Phosphatase: 106 U/L (ref 39–117)
BUN: 27 mg/dL — ABNORMAL HIGH (ref 6–23)
CALCIUM: 9.3 mg/dL (ref 8.4–10.5)
CHLORIDE: 102 meq/L (ref 96–112)
CO2: 29 mEq/L (ref 19–32)
Creatinine, Ser: 0.9 mg/dL (ref 0.4–1.2)
GFR: 62.08 mL/min (ref >60.00)
Glucose, Bld: 92 mg/dL (ref 70–99)
POTASSIUM: 3.7 meq/L (ref 3.5–5.1)
Sodium: 140 mEq/L (ref 135–145)
Total Bilirubin: 0.5 mg/dL (ref 0.2–1.2)
Total Protein: 6.9 g/dL (ref 6.0–8.3)

## 2014-01-01 LAB — CBC WITH DIFFERENTIAL/PLATELET
BASOS ABS: 0.1 10*3/uL (ref 0.0–0.1)
Basophils Relative: 1 % (ref 0.0–3.0)
EOS ABS: 0.3 10*3/uL (ref 0.0–0.7)
Eosinophils Relative: 4.4 % (ref 0.0–5.0)
HCT: 33.7 % — ABNORMAL LOW (ref 36.0–46.0)
Hemoglobin: 11.1 g/dL — ABNORMAL LOW (ref 12.0–15.0)
LYMPHS PCT: 25.4 % (ref 12.0–46.0)
Lymphs Abs: 1.9 10*3/uL (ref 0.7–4.0)
MCHC: 32.9 g/dL (ref 30.0–36.0)
MCV: 84 fl (ref 78.0–100.0)
Monocytes Absolute: 0.4 10*3/uL (ref 0.1–1.0)
Monocytes Relative: 5.6 % (ref 3.0–12.0)
NEUTROS ABS: 4.8 10*3/uL (ref 1.4–7.7)
NEUTROS PCT: 63.6 % (ref 43.0–77.0)
Platelets: 402 10*3/uL — ABNORMAL HIGH (ref 150.0–400.0)
RBC: 4.01 Mil/uL (ref 3.87–5.11)
RDW: 14.7 % (ref 11.5–15.5)
WBC: 7.6 10*3/uL (ref 4.0–10.5)

## 2014-03-31 DIAGNOSIS — Z0279 Encounter for issue of other medical certificate: Secondary | ICD-10-CM

## 2014-04-15 ENCOUNTER — Ambulatory Visit: Payer: Medicare Other | Admitting: Internal Medicine

## 2014-04-21 ENCOUNTER — Encounter: Payer: Self-pay | Admitting: Internal Medicine

## 2014-04-21 ENCOUNTER — Ambulatory Visit (INDEPENDENT_AMBULATORY_CARE_PROVIDER_SITE_OTHER): Payer: Medicare Other | Admitting: Internal Medicine

## 2014-04-21 VITALS — BP 132/68 | HR 78 | Temp 98.3°F | Ht 61.0 in | Wt 128.5 lb

## 2014-04-21 DIAGNOSIS — F03918 Unspecified dementia, unspecified severity, with other behavioral disturbance: Secondary | ICD-10-CM

## 2014-04-21 DIAGNOSIS — Z23 Encounter for immunization: Secondary | ICD-10-CM

## 2014-04-21 DIAGNOSIS — F0391 Unspecified dementia with behavioral disturbance: Secondary | ICD-10-CM

## 2014-04-21 LAB — CBC WITH DIFFERENTIAL/PLATELET
Basophils Absolute: 0.1 10*3/uL (ref 0.0–0.1)
Basophils Relative: 1.5 % (ref 0.0–3.0)
EOS ABS: 0.2 10*3/uL (ref 0.0–0.7)
Eosinophils Relative: 2.5 % (ref 0.0–5.0)
HCT: 36.8 % (ref 36.0–46.0)
HEMOGLOBIN: 11.9 g/dL — AB (ref 12.0–15.0)
LYMPHS ABS: 1.4 10*3/uL (ref 0.7–4.0)
LYMPHS PCT: 17.3 % (ref 12.0–46.0)
MCHC: 32.5 g/dL (ref 30.0–36.0)
MCV: 86.8 fl (ref 78.0–100.0)
Monocytes Absolute: 0.6 10*3/uL (ref 0.1–1.0)
Monocytes Relative: 7.2 % (ref 3.0–12.0)
NEUTROS ABS: 5.9 10*3/uL (ref 1.4–7.7)
Neutrophils Relative %: 71.5 % (ref 43.0–77.0)
Platelets: 358 10*3/uL (ref 150.0–400.0)
RBC: 4.24 Mil/uL (ref 3.87–5.11)
RDW: 14 % (ref 11.5–15.5)
WBC: 8.2 10*3/uL (ref 4.0–10.5)

## 2014-04-21 LAB — COMPREHENSIVE METABOLIC PANEL
ALK PHOS: 108 U/L (ref 39–117)
ALT: 23 U/L (ref 0–35)
AST: 37 U/L (ref 0–37)
Albumin: 3.9 g/dL (ref 3.5–5.2)
BUN: 23 mg/dL (ref 6–23)
CALCIUM: 9 mg/dL (ref 8.4–10.5)
CO2: 26 mEq/L (ref 19–32)
CREATININE: 1 mg/dL (ref 0.4–1.2)
Chloride: 101 mEq/L (ref 96–112)
GFR: 55.82 mL/min — ABNORMAL LOW (ref >60.00)
Glucose, Bld: 121 mg/dL — ABNORMAL HIGH (ref 70–99)
POTASSIUM: 4 meq/L (ref 3.5–5.1)
Sodium: 136 mEq/L (ref 135–145)
Total Bilirubin: 0.5 mg/dL (ref 0.2–1.2)
Total Protein: 7.5 g/dL (ref 6.0–8.3)

## 2014-04-21 NOTE — Progress Notes (Signed)
   Subjective:    Patient ID: Kelli Matthews, female    DOB: 1940-12-03, 73 y.o.   MRN: [id number]  HPI 73YO female presents for follow up. She is unable to answer questions today except to say that she is not in any discomfort. Her son reports she has been stable with no complaints. She continues to have occasional loose stools. She uses prn Imodium for this. Her husband died recently, but it is not clear that she is aware of this. She has also been followed by Dr. Maryruth Bun, but no recent changes made to medications.  Review of Systems  Unable to perform ROS: Dementia       Objective:    BP 132/68  Pulse 78  Temp(Src) 98.3 F (36.8 C) (Oral)  Ht  (1.549 m)  Wt 128 lb 8 oz (58.287 kg)  BMI 24.29 kg/m2  SpO2 96% Physical Exam  Constitutional: She is oriented to person, place, and time. She appears well-developed and well-nourished. No distress.  HENT:  Head: Normocephalic and atraumatic.  Right Ear: External ear normal.  Left Ear: External ear normal.  Nose: Nose normal.  Mouth/Throat: Oropharynx is clear and moist. No oropharyngeal exudate.  Eyes: Conjunctivae are normal. Pupils are equal, round, and reactive to light. Right eye exhibits no discharge. Left eye exhibits no discharge. No scleral icterus.  Neck: Normal range of motion. Neck supple. No tracheal deviation present. No thyromegaly present.  Cardiovascular: Normal rate, regular rhythm, normal heart sounds and intact distal pulses.  Exam reveals no gallop and no friction rub.   No murmur heard. Pulmonary/Chest: Effort normal and breath sounds normal. No accessory muscle usage. Not tachypneic. No respiratory distress. She has no decreased breath sounds. She has no wheezes. She has no rhonchi. She has no rales. She exhibits no tenderness.  Musculoskeletal: Normal range of motion. She exhibits no edema and no tenderness.  Lymphadenopathy:    She has no cervical adenopathy.  Neurological: She is alert and oriented to  person, place, and time. No cranial nerve deficit. She exhibits normal muscle tone. Coordination normal.  Skin: Skin is warm and dry. No rash noted. She is not diaphoretic. No erythema. No pallor.  Psychiatric: She has a normal mood and affect. Her speech is normal and behavior is normal. Thought content is delusional. Cognition and memory are impaired. She expresses impulsivity and inappropriate judgment. She exhibits abnormal recent memory and abnormal remote memory.          Assessment & Plan:   Problem List Items Addressed This Visit     Unprioritized   Dementia with behavioral disturbance - Primary     Progressive dementia. Will continue current medications. 24hr care provided by SNF. Will check CBC and CMP with labs today. Flu vaccine today.  Over of which >50% spent in face-to-face contact with patient's son discussing plan of care     Relevant Orders      Comprehensive metabolic panel      CBC with Differential       Return in about 6 months (around 10/22/2014) for Recheck.

## 2014-04-21 NOTE — Patient Instructions (Signed)
Labs today.  Follow up 6 months or sooner as needed.

## 2014-04-21 NOTE — Addendum Note (Signed)
Addended by: Chandra Batch E on: 04/21/2014 02:56 PM   Modules accepted: Orders

## 2014-04-21 NOTE — Progress Notes (Signed)
Pre visit review using our clinic review tool, if applicable. No additional management support is needed unless otherwise documented below in the visit note. 

## 2014-04-21 NOTE — Assessment & Plan Note (Signed)
Progressive dementia. Will continue current medications. 24hr care provided by SNF. Will check CBC and CMP with labs today. Flu vaccine today.  Over of which >50% spent in face-to-face contact with patient's son discussing plan of care

## 2014-04-26 ENCOUNTER — Other Ambulatory Visit: Payer: Self-pay | Admitting: Internal Medicine

## 2014-04-26 NOTE — Telephone Encounter (Signed)
Last refill 6.22.15, last OV 8.26.15.  Please advise refill

## 2014-05-13 ENCOUNTER — Other Ambulatory Visit: Payer: Self-pay | Admitting: Internal Medicine

## 2014-06-11 ENCOUNTER — Encounter: Payer: Self-pay | Admitting: *Deleted

## 2014-06-24 ENCOUNTER — Telehealth: Payer: Self-pay

## 2014-06-24 NOTE — Telephone Encounter (Signed)
Spoke with pt son and he informed that pt is not able to go to [facility] appt this year.

## 2014-08-03 IMAGING — CR DG CHEST 2V
1 series · 2 of 2 positions shown · non-contrast
Comparison: 06/08/2011

CLINICAL DATA: Syncope today

EXAM:
CHEST  2 VIEW

[Series 1: w chest pa · 0.14mm/px · 2 of 2 slices shown]
[im 1/2]
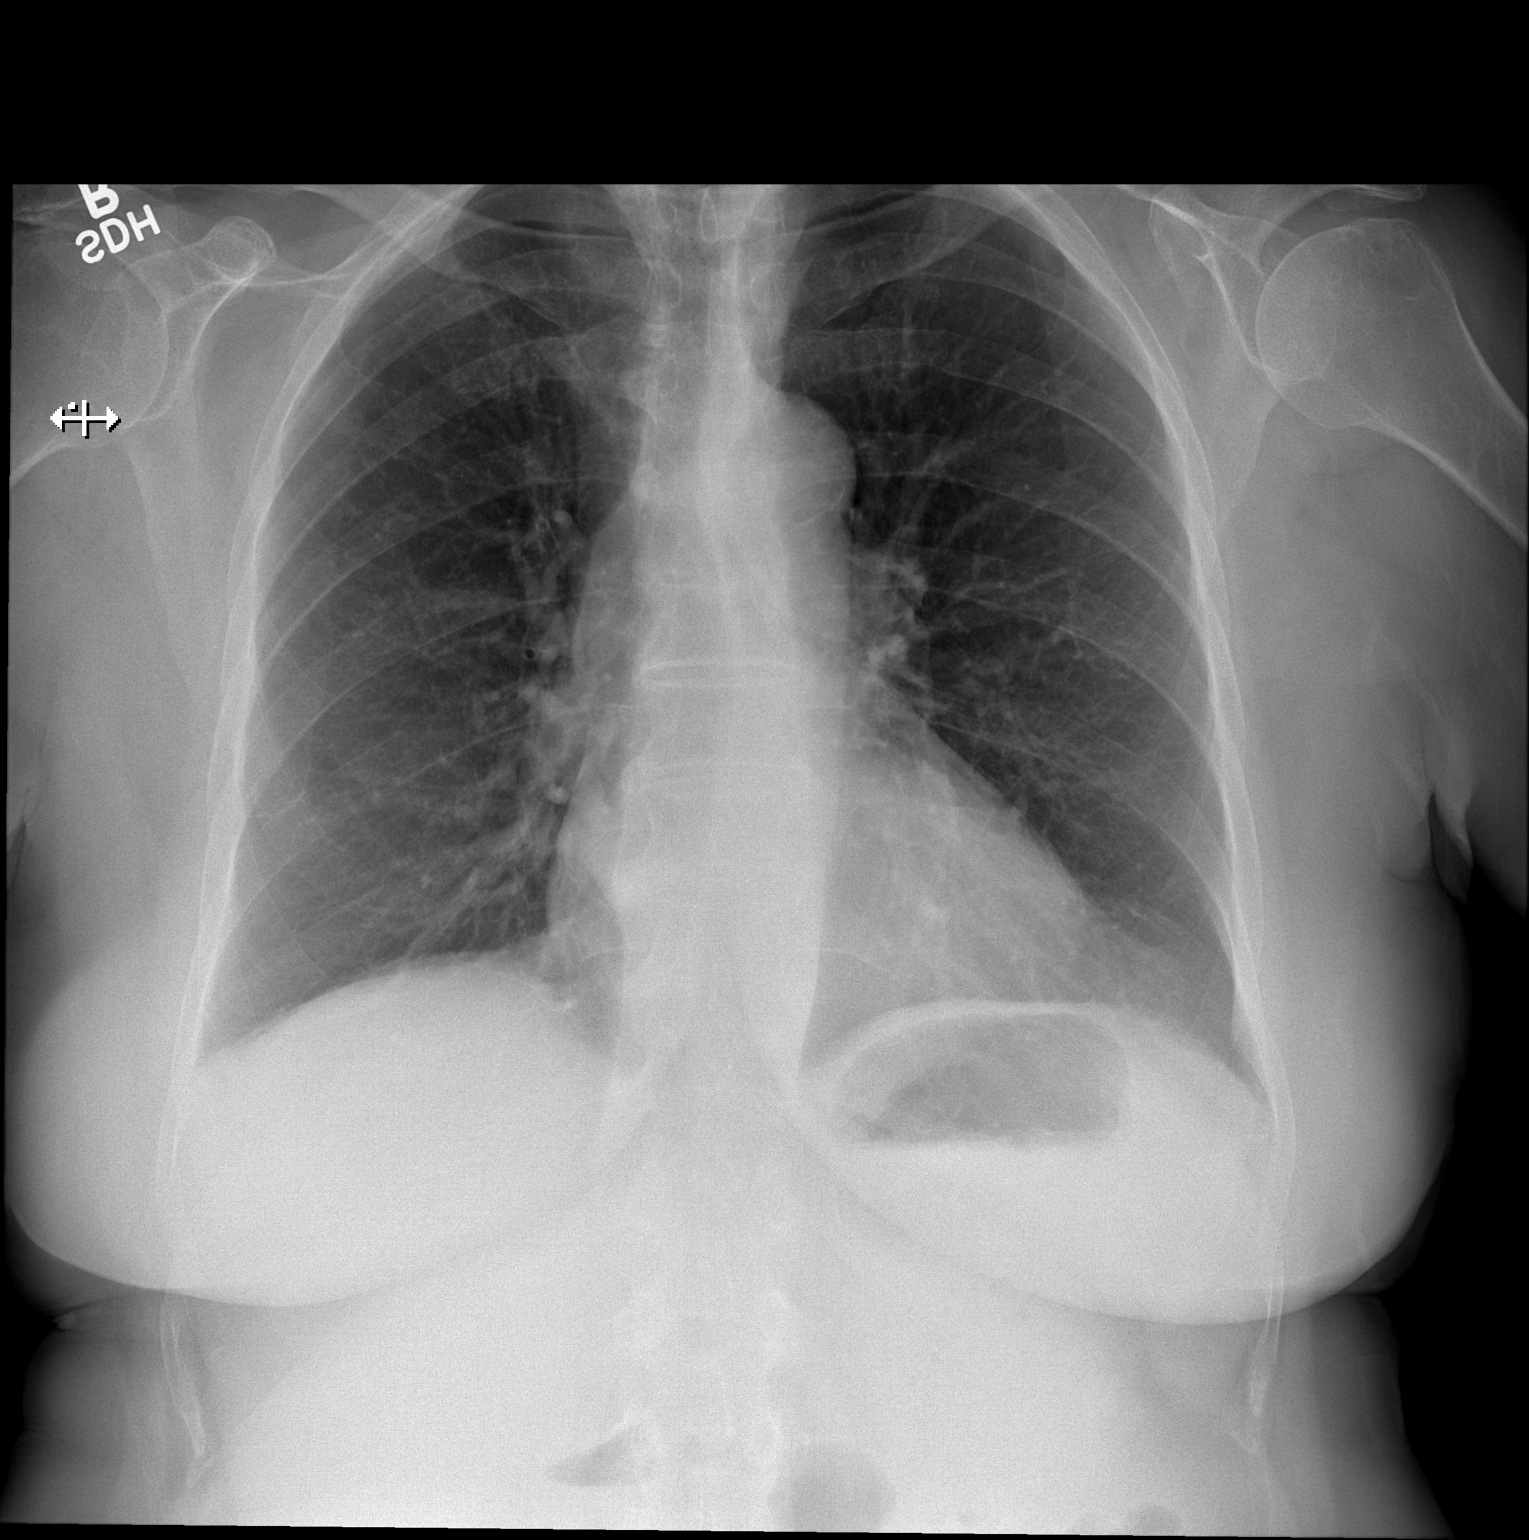
[im 2/2]
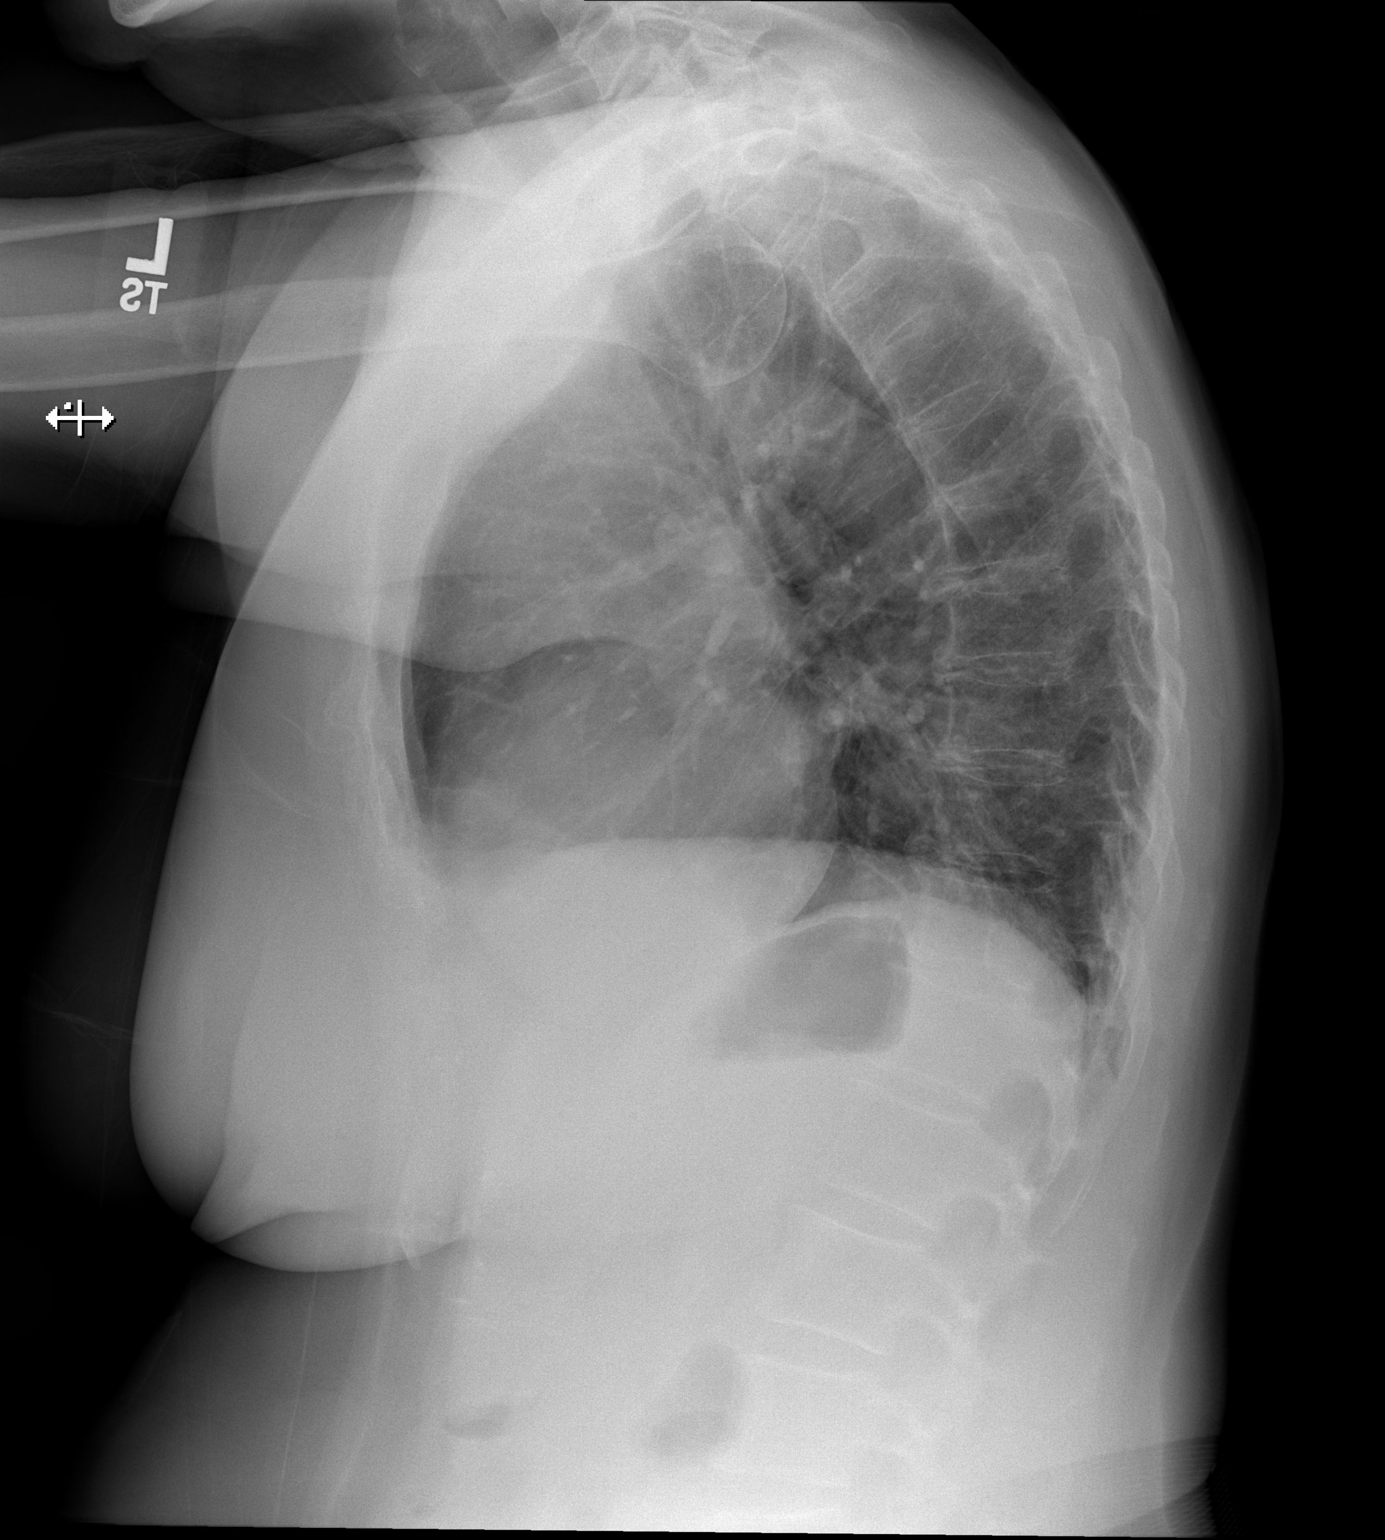

[2 of 2 positions shown; findings below may reference images not displayed]

FINDINGS: The heart size and mediastinal contours are within normal limits.
Both lungs are clear. The visualized skeletal structures are
unremarkable.
IMPRESSION: No active cardiopulmonary disease.

## 2014-10-25 ENCOUNTER — Ambulatory Visit (INDEPENDENT_AMBULATORY_CARE_PROVIDER_SITE_OTHER): Payer: Medicare Other | Admitting: Internal Medicine

## 2014-10-25 ENCOUNTER — Encounter: Payer: Self-pay | Admitting: Internal Medicine

## 2014-10-25 VITALS — BP 112/65 | HR 77 | Temp 98.9°F | Ht 61.0 in | Wt 130.4 lb

## 2014-10-25 DIAGNOSIS — F0391 Unspecified dementia with behavioral disturbance: Secondary | ICD-10-CM

## 2014-10-25 DIAGNOSIS — I10 Essential (primary) hypertension: Secondary | ICD-10-CM

## 2014-10-25 DIAGNOSIS — K529 Noninfective gastroenteritis and colitis, unspecified: Secondary | ICD-10-CM

## 2014-10-25 DIAGNOSIS — D649 Anemia, unspecified: Secondary | ICD-10-CM

## 2014-10-25 DIAGNOSIS — F03918 Unspecified dementia, unspecified severity, with other behavioral disturbance: Secondary | ICD-10-CM

## 2014-10-25 LAB — COMPREHENSIVE METABOLIC PANEL
ALT: 20 U/L (ref 0–35)
AST: 30 U/L (ref 0–37)
Albumin: 4 g/dL (ref 3.5–5.2)
Alkaline Phosphatase: 100 U/L (ref 39–117)
BUN: 41 mg/dL — AB (ref 6–23)
CHLORIDE: 104 meq/L (ref 96–112)
CO2: 24 meq/L (ref 19–32)
Calcium: 9.2 mg/dL (ref 8.4–10.5)
Creatinine, Ser: 1.21 mg/dL — ABNORMAL HIGH (ref 0.40–1.20)
GFR: 46.28 mL/min — ABNORMAL LOW (ref >60.00)
GLUCOSE: 176 mg/dL — AB (ref 70–99)
Potassium: 3.9 mEq/L (ref 3.5–5.1)
SODIUM: 138 meq/L (ref 135–145)
TOTAL PROTEIN: 7 g/dL (ref 6.0–8.3)
Total Bilirubin: 0.4 mg/dL (ref 0.2–1.2)

## 2014-10-25 LAB — CBC WITH DIFFERENTIAL/PLATELET
Basophils Absolute: 0.1 10*3/uL (ref 0.0–0.1)
Basophils Relative: 0.6 % (ref 0.0–3.0)
EOS ABS: 0 10*3/uL (ref 0.0–0.7)
Eosinophils Relative: 0.3 % (ref 0.0–5.0)
HCT: 34.5 % — ABNORMAL LOW (ref 36.0–46.0)
Hemoglobin: 11.4 g/dL — ABNORMAL LOW (ref 12.0–15.0)
LYMPHS PCT: 13.6 % (ref 12.0–46.0)
Lymphs Abs: 1.9 10*3/uL (ref 0.7–4.0)
MCHC: 33 g/dL (ref 30.0–36.0)
MCV: 85.6 fl (ref 78.0–100.0)
MONOS PCT: 7.1 % (ref 3.0–12.0)
Monocytes Absolute: 1 10*3/uL (ref 0.1–1.0)
NEUTROS PCT: 78.4 % — AB (ref 43.0–77.0)
Neutro Abs: 10.8 10*3/uL — ABNORMAL HIGH (ref 1.4–7.7)
PLATELETS: 337 10*3/uL (ref 150.0–400.0)
RBC: 4.04 Mil/uL (ref 3.87–5.11)
RDW: 13.5 % (ref 11.5–15.5)
WBC: 13.8 10*3/uL — ABNORMAL HIGH (ref 4.0–10.5)

## 2014-10-25 NOTE — Assessment & Plan Note (Signed)
Progressive dementia. Symptoms of behavioral disturbance have been controlled with current medications. Continue to follow up with Dr. Maryruth BunKapur.

## 2014-10-25 NOTE — Progress Notes (Signed)
   Subjective:    Patient ID: Kelli FlemingRachel E Hamrick, female    DOB: 01/04/1941, 74 y.o.   MRN: [id number]  HPI  74YO female with dementia presents for follow up.  Per son, no recent issues. Continues to have some chronic diarrhea. Unchanged from previous. Mood has been good. Seen by Dr. Maryruth BunKapur in December. No changes made to medications.  Past medical, surgical, family and social history per today's encounter.  Review of Systems  Unable to perform ROS  Wt Readings from Last 3 Encounters:  10/25/14 130 lb 6 oz (59.138 kg)  04/21/14 128 lb 8 oz (58.287 kg)  12/31/13 139 lb (63.05 kg)       Objective:    BP 112/65 mmHg  Pulse 77  Temp(Src) 98.9 F (37.2 C) (Oral)  Ht 5\' 1"  (1.549 m)  Wt 130 lb 6 oz (59.138 kg)  BMI 24.65 kg/m2  SpO2 97% Physical Exam  Constitutional: She is oriented to person, place, and time. She appears well-developed and well-nourished. No distress.  HENT:  Head: Normocephalic and atraumatic.  Right Ear: External ear normal.  Left Ear: External ear normal.  Nose: Nose normal.  Mouth/Throat: Oropharynx is clear and moist. No oropharyngeal exudate.  Eyes: Conjunctivae are normal. Pupils are equal, round, and reactive to light. Right eye exhibits no discharge. Left eye exhibits no discharge. No scleral icterus.  Neck: Normal range of motion. Neck supple. No tracheal deviation present. No thyromegaly present.  Cardiovascular: Normal rate, regular rhythm, normal heart sounds and intact distal pulses.  Exam reveals no gallop and no friction rub.   No murmur heard. Pulmonary/Chest: Effort normal and breath sounds normal. No respiratory distress. She has no wheezes. She has no rales. She exhibits no tenderness.  Abdominal: Soft. Bowel sounds are normal. She exhibits no distension. There is no tenderness.  Musculoskeletal: Normal range of motion. She exhibits no edema or tenderness.  Lymphadenopathy:    She has no cervical adenopathy.  Neurological: She is alert and  oriented to person, place, and time. No cranial nerve deficit. She exhibits normal muscle tone. Coordination normal.  Skin: Skin is warm and dry. No rash noted. She is not diaphoretic. No erythema. No pallor.  Psychiatric: She has a normal mood and affect. She is withdrawn. Cognition and memory are impaired. She expresses inappropriate judgment. She exhibits abnormal recent memory and abnormal remote memory.  Communicates only minimally, when prompted, answers inappropriately.          Assessment & Plan:  Over 25min of which >50% spent in face-to-face contact with patient discussing plan of care  Problem List Items Addressed This Visit      Unprioritized   Anemia    Will check blood counts with labs.      Chronic diarrhea    Chronic diarrhea. Will check electrolytes and renal function with labs.      Relevant Orders   CBC with Differential/Platelet   Dementia with behavioral disturbance    Progressive dementia. Symptoms of behavioral disturbance have been controlled with current medications. Continue to follow up with Dr. Maryruth BunKapur.      Hypertension - Primary    BP Readings from Last 3 Encounters:  10/25/14 112/65  04/21/14 132/68  12/31/13 136/62   BP well controlled. Renal function with labs.      Relevant Orders   Comprehensive metabolic panel       Return in about 6 months (around 04/25/2015) for Recheck.

## 2014-10-25 NOTE — Assessment & Plan Note (Signed)
BP Readings from Last 3 Encounters:  10/25/14 112/65  04/21/14 132/68  12/31/13 136/62   BP well controlled. Renal function with labs.

## 2014-10-25 NOTE — Assessment & Plan Note (Signed)
Chronic diarrhea. Will check electrolytes and renal function with labs.

## 2014-10-25 NOTE — Patient Instructions (Signed)
Labs today.  Follow up in 6 months and as needed. 

## 2014-10-25 NOTE — Assessment & Plan Note (Signed)
Will check blood counts with labs.

## 2014-10-25 NOTE — Progress Notes (Signed)
Pre visit review using our clinic review tool, if applicable. No additional management support is needed unless otherwise documented below in the visit note. 

## 2014-10-27 ENCOUNTER — Telehealth: Payer: Self-pay | Admitting: *Deleted

## 2014-10-27 NOTE — Telephone Encounter (Signed)
Please encourage resident to drink fluids and offer drinks both with meals and between meals.

## 2014-10-27 NOTE — Telephone Encounter (Signed)
Pt's son called, states Kelli Matthews requires some written order for instructions related to lab results "Labs show slight decline in kidney function which appears to be from dehydration. I would encourage fluid intake as much as possible." Fax number [phone] [facility]. Any instruction on what you want letter to say?

## 2014-10-28 NOTE — Telephone Encounter (Signed)
Letter faxed by Raynelle FanningJulie

## 2014-10-28 NOTE — Telephone Encounter (Signed)
Letter printed and placed in folder for signature.

## 2014-11-16 ENCOUNTER — Telehealth: Payer: Self-pay | Admitting: *Deleted

## 2014-11-16 ENCOUNTER — Other Ambulatory Visit: Payer: Self-pay | Admitting: *Deleted

## 2014-11-16 DIAGNOSIS — D72829 Elevated white blood cell count, unspecified: Secondary | ICD-10-CM

## 2014-11-16 DIAGNOSIS — N179 Acute kidney failure, unspecified: Secondary | ICD-10-CM

## 2014-11-16 NOTE — Telephone Encounter (Signed)
CMP and CBC for acute renal failure and leukocytosis

## 2014-11-16 NOTE — Telephone Encounter (Signed)
What labs and dx?  

## 2014-11-18 ENCOUNTER — Other Ambulatory Visit (INDEPENDENT_AMBULATORY_CARE_PROVIDER_SITE_OTHER): Payer: Medicare Other

## 2014-11-18 DIAGNOSIS — D72829 Elevated white blood cell count, unspecified: Secondary | ICD-10-CM

## 2014-11-18 DIAGNOSIS — N179 Acute kidney failure, unspecified: Secondary | ICD-10-CM | POA: Diagnosis not present

## 2014-11-18 LAB — CBC WITH DIFFERENTIAL/PLATELET
BASOS ABS: 0.2 10*3/uL — AB (ref 0.0–0.1)
Basophils Relative: 2.2 % (ref 0.0–3.0)
EOS PCT: 2.9 % (ref 0.0–5.0)
Eosinophils Absolute: 0.2 10*3/uL (ref 0.0–0.7)
HEMATOCRIT: 33.4 % — AB (ref 36.0–46.0)
Hemoglobin: 11.2 g/dL — ABNORMAL LOW (ref 12.0–15.0)
LYMPHS ABS: 2.1 10*3/uL (ref 0.7–4.0)
LYMPHS PCT: 26.2 % (ref 12.0–46.0)
MCHC: 33.5 g/dL (ref 30.0–36.0)
MCV: 84.6 fl (ref 78.0–100.0)
Monocytes Absolute: 0.7 10*3/uL (ref 0.1–1.0)
Monocytes Relative: 8.3 % (ref 3.0–12.0)
Neutro Abs: 4.9 10*3/uL (ref 1.4–7.7)
Neutrophils Relative %: 60.4 % (ref 43.0–77.0)
PLATELETS: 380 10*3/uL (ref 150.0–400.0)
RBC: 3.95 Mil/uL (ref 3.87–5.11)
RDW: 13.1 % (ref 11.5–15.5)
WBC: 8.1 10*3/uL (ref 4.0–10.5)

## 2014-11-18 LAB — COMPREHENSIVE METABOLIC PANEL
ALK PHOS: 89 U/L (ref 39–117)
ALT: 35 U/L (ref 0–35)
AST: 56 U/L — AB (ref 0–37)
Albumin: 3.7 g/dL (ref 3.5–5.2)
BUN: 28 mg/dL — ABNORMAL HIGH (ref 6–23)
CHLORIDE: 103 meq/L (ref 96–112)
CO2: 29 mEq/L (ref 19–32)
CREATININE: 0.99 mg/dL (ref 0.40–1.20)
Calcium: 9.3 mg/dL (ref 8.4–10.5)
GFR: 58.33 mL/min — ABNORMAL LOW (ref >60.00)
Glucose, Bld: 131 mg/dL — ABNORMAL HIGH (ref 70–99)
Potassium: 4 mEq/L (ref 3.5–5.1)
Sodium: 138 mEq/L (ref 135–145)
Total Bilirubin: 0.2 mg/dL (ref 0.2–1.2)
Total Protein: 6.7 g/dL (ref 6.0–8.3)

## 2014-11-25 ENCOUNTER — Emergency Department
Admit: 2014-11-25 | Disposition: A | Discharge status: Home / Self Care | Payer: Self-pay | Admitting: Emergency Medicine

## 2014-11-25 ENCOUNTER — Telehealth: Payer: Self-pay | Admitting: Internal Medicine

## 2014-11-25 LAB — URINALYSIS, COMPLETE
BACTERIA: NONE SEEN
Bilirubin,UR: NEGATIVE
Glucose,UR: NEGATIVE mg/dL (ref 0–75)
Ketone: NEGATIVE
Leukocyte Esterase: NEGATIVE
NITRITE: NEGATIVE
PROTEIN: NEGATIVE
Ph: 5 (ref 4.5–8.0)
RBC,UR: 32 /HPF (ref 0–5)
SPECIFIC GRAVITY: 1.01 (ref 1.003–1.030)
Squamous Epithelial: NONE SEEN
WBC UR: 4 /HPF (ref 0–5)

## 2014-11-25 NOTE — Telephone Encounter (Signed)
FYI

## 2014-11-25 NOTE — Telephone Encounter (Signed)
[facility] - [place] TELEPHONE ADVICE RECORD [facility] Patient Name: Kelli RoysRACHEL [name] DOB: 09/29/1940 Initial Comment [name] states patient has blood in her urine Nurse Assessment Nurse: [name], RN, [name] Date/Time (Eastern Time): 11/25/2014 2:48:02 PM Confirm and document reason for call. If symptomatic, describe symptoms. ---[facility] states patient has blood in her urine Has the patient traveled out of the country within the last 30 days? ---Not Applicable Does the patient require triage? ---Yes Related visit to physician within the last 2 weeks? ---No Does the PT have any chronic conditions? (i.e. diabetes, asthma, etc.) ---Yes List chronic conditions. ---[name], sleep disturbances, agitation Guidelines Guideline Title Affirmed Question Affirmed Notes Urine - Blood In Passing pure blood or large blood clots (i.e., size > a dime) (Exception: fleck or small strands) Final Disposition User Go to ED Now Burress, RN, [name]

## 2014-12-18 NOTE — H&P (Signed)
PATIENT NAME:  Kelli Matthews, Kelli Matthews MR#:  [id number] DATE OF BIRTH:  21-Sep-1940  DATE OF ADMISSION:  09/09/2013  PRIMARY CARE PHYSICIAN: Kelli DikeJennifer A. Dan HumphreysWalker, MD  REFERRING PHYSICIAN: Enedina Finnerandolph N. Manson PasseyBrown, MD  CHIEF COMPLAINT: Weakness and questionable syncope.   HISTORY OF PRESENT ILLNESS: Ms. Kelli Matthews is a 74 year old female with a history of advanced dementia, hypertension, who is brought to the Emergency Department by EMS after having an episode of questionable syncope. The patient was getting ready to undergo colonoscopy. The patient has been only drinking fluids. On the way to the bathroom, the patient felt weak with questionable syncope. Concerning this, EMS was called and was brought to the Emergency Department. At the time of EMS arrival, the patient was alert with normal vital signs. The patient did not have any episodes of vomiting. On workup in the Emergency Department, lipase was noted to be 7879. CT abdomen and pelvis was obtained. No obvious abnormality was noted. No imaging evidence of pancreatitis. Repeat lipase came back to be 4113. The patient denies having any abdominal pain. The patient has mild elevation of the AST of 253, ALT of 107, a normal bilirubin and alkaline phosphatase.  PAST MEDICAL HISTORY: 1. Dementia.  2. Partial colectomy.  3. Breast surgery.  ALLERGIES:  1. AUGMENTIN.  2. LATEX.   HOME MEDICATIONS:  1. Aricept 10 mg once a day.  2. Aspirin 81 mg daily.  3. Celexa 21 mg daily.  4. DermaCloud topical cream.  5. Flonase 2 sprays once a day.  6. Namenda 10 mg 2 times a day.  7.  topical cream.   8. Remeron 30 mg once a day. 9. Seroquel 25 mg every 8 hours as needed.  10. Seroquel  mg at bedtime.  11. Toprol-XL 1 tablet daily.  12. Vitamin D3 1000 units daily.  13. Zestoretic 12.5/10 mg daily.   SOCIAL HISTORY: Former smoker. No history of alcohol or drug use. Currently lives in a nursing home. Son is the medical power of attorney.   FAMILY HISTORY: Could not  be obtained from the patient.   REVIEW OF SYSTEMS: Could not be obtained from the patient secondary to advanced dementia.   PHYSICAL EXAMINATION:  GENERAL: This is a well-built, well-nourished, age-appropriate female lying down in the bed, not in distress.  VITAL SIGNS: Temperature 98.1, pulse 98, blood pressure 113/61, respiratory rate of 16, oxygen saturation is 98% on room air.  HEENT: Head normocephalic, atraumatic. Eyes: No sclerae icterus. Conjunctivae normal. Pupils equal and reactive to light. Extraocular movements are intact. Mucous membranes: Mild dryness. No pharyngeal erythema. NECK: Supple. No lymphadenopathy. No JVD. No carotid bruit. No thyromegaly.  CHEST: Has no focal tenderness.  LUNGS: Bilaterally clear to auscultation.  HEART: S1, S2 regular. No murmurs are heard.  ABDOMEN: Bowel sounds present. Soft, nontender, nondistended. No hepatosplenomegaly.  EXTREMITIES: No pedal edema. Pulses 2+.  NEUROLOGIC: The patient is alert, not oriented to place, person and time. Cranial nerves II through XII intact. Motor 5/5 in upper and lower extremities.  SKIN: No rash or lesions.  MUSCULOSKELETAL: Good range of motion in all the extremities.   DIAGNOSTIC STUDIES: CT abdomen and pelvis within normal limits.  Troponin less than 0.02.  Initial lipase was 8000.  CBC is completely within normal limits.  CMP is completely within normal limits except for ALT of 107, AST of 253.   ASSESSMENT AND PLAN: Ms. Kelli Matthews is a 74 year old female who comes with questionable episode of syncope and is found to have acute  pancreatitis.   1. Acute pancreatitis. CT abdomen and pelvis does not show; however, has elevated lipase, confirmed with a test. Cause is uncertain. CT abdomen and pelvis does not show any gallstones. Very mild elevation of the LFTs, normal alkaline phosphatase, which is less likely reason for gallstone-induced pancreatitis. The patient's abdomen is completely benign, nontender. Will  continue to cycle lipase daily. Continue with IV fluids.  2. Questionable syncope, most likely secondary to dehydration. The patient stood up and walking towards the bathroom when this episode occurred. Will continue with IV fluids and follow up.  3. Elevated liver function tests, most likely from syncope and possible hypotension. Will continue to follow up CMP.  4. Dementia. Will continue with home medications.  5. Keep the patient on deep vein thrombosis prophylaxis with Lovenox.   TIME SPENT: 45 minutes.   ____________________________ Susa Griffins, MD pv:lb D: 09/09/2013 04:58:55 ET T: 09/09/2013 06:12:11 ET JOB#: [id number]  cc: Susa Griffins, MD, <Dictator> Kelli Pitman. Dan Humphreys, MD Kelli Lav Jonai Weyland MD ELECTRONICALLY SIGNED 09/20/2013 21:18

## 2014-12-18 NOTE — Discharge Summary (Signed)
PATIENT NAME:  Kelli Matthews, Kelli Matthews MR#:  [id number] DATE OF BIRTH:  06-11-1941  DATE OF ADMISSION:  09/08/2013 DATE OF DISCHARGE:  09/09/2013  ADMISSION DIAGNOSES:  1. Acute pancreatitis.  2. Questionable syncope.   DISCHARGE DIAGNOSES: 1. Possible acute pancreatitis with nonspecific elevation in lipase.  2. Questionable syncope.  3. Dementia.   CONSULTATIONS: Dr. Lemar LivingsByrnett.   PROCEDURES: The patient underwent a colonoscopy on 09/09/2013 which essentially was negative.   CT scan showed no evidence of acute pancreatitis. Upper quadrant abdominal ultrasound was negative for gallstones or acute cholecystitis.    LIPASE 600 at discharge   HOSPITAL COURSE: This is a 74 year old female who presented with questionable syncope after taking her prep for a planned colonoscopy. She was incidentally found to have elevated lipase. Her CT scan on admission showed no evidence of acute pancreatitis. For further details, please refer to the H and P. 1. Acute pancreatitis, possible. The patient has an elevated lipase. Her CT scan did not show evidence of pancreatitis. She had no abdominal pain. Her abdominal exam was completely benign. She was;  however treated for possible acute pancreatitis. Once again, this could be just a nonspecific elevation of her lipase, but her LFTs were also elevated which possibly she could have passed a stone as well as causing this elevation in her lipase as well as AST. She was; however treated for acute pancreatitis n.p.o. and supportive care. She did fine. She tolerated her diet. Her LFTs have come down significantly. She had a right upper quadrant abdominal ultrasound which basically showed no evidence of acute cholecystitis or gallbladder symptoms.   Since she was scheduled to have a colonscopy adn did the prep she had it while she was here and it was  normal  2. Questionable syncope, likely this is due to dehydration from prep for colonscopy. I suspect this is probably  presyncope. She had no acute events on telemetry. Cardiac enzymes are negative.  3. Dementia. The patient can restart her outpatient medications.  4. Hypertension. The patient will continue outpatient medications.   DISCHARGE MEDICATIONS: 1. Aspirin 81 mg daily.  2. Desonide 0.05% topical as needed at night. 3. Triamcinolone topical to upper back and chest as needed.  4. Therems vitamin 1 tablet daily 5. Celexa 20 mg daily.  6. Toprol 25 mg daily.  7. Flonase 50 mcg two sprays daily.  8. Zestoretic 12.5/10 daily.  9. Vitamin D3 1000 international units daily.  10. Gentamicin topical to upper back b.i.d.  11. Namenda 10 mg b.i.d.  12. Aricept 10 mg daily.  13. Seroquel 25  mg 1-1/2 tablets daily at bedtime.  14. Seroquel 25 mg q.8h. p.r.n.  15. Remeron 30 mg at bedtime.  16. Topical creams   to upper back daily as needed for itching.  17. DermaCloud topical to buttocks a day for seven days at 8:00 a.m. and 8:00 p.m.   DISCHARGE DIET: Low sodium.   DISCHARGE ACTIVITY: As tolerated.   DISCHARGE FOLLOW-UP: The patient to follow-up Dr. Lemar LivingsByrnett on as-needed basis and Dr. Ronna PolioJennifer Walker.   TIME SPENT: Approximately 35 minutes.  ____________________________ Janyth ContesSital P. Juliene PinaMody, MD spm:sg D: 09/09/2013 13:20:00 ET T: 09/09/2013 13:38:30 ET JOB#: [id number]  cc: Victorino DikeJennifer A. Dan HumphreysWalker, MD Denesha Brouse P. Juliene PinaMody, MD, <Dictator> Earline MayotteJeffrey W. Byrnett, MD    Janyth ContesSITAL P Leanny Moeckel MD ELECTRONICALLY SIGNED 09/09/2013 14:27

## 2014-12-27 ENCOUNTER — Inpatient Hospital Stay: Admission: RE | Admit: 2014-12-27 | Payer: Medicare Other | Source: Ambulatory Visit

## 2014-12-28 ENCOUNTER — Encounter
Admission: RE | Admit: 2014-12-28 | Discharge: 2014-12-28 | Disposition: A | Discharge status: Home / Self Care | Payer: Medicare Other | Source: Ambulatory Visit | Attending: Urology | Admitting: Urology

## 2014-12-28 ENCOUNTER — Other Ambulatory Visit: Payer: Self-pay

## 2014-12-28 DIAGNOSIS — Z01810 Encounter for preprocedural cardiovascular examination: Secondary | ICD-10-CM | POA: Diagnosis not present

## 2014-12-28 DIAGNOSIS — N200 Calculus of kidney: Secondary | ICD-10-CM | POA: Diagnosis not present

## 2014-12-28 DIAGNOSIS — Z01812 Encounter for preprocedural laboratory examination: Secondary | ICD-10-CM | POA: Insufficient documentation

## 2014-12-28 LAB — BASIC METABOLIC PANEL
Anion gap: 8 (ref 5–15)
BUN: 28 mg/dL — AB (ref 6–20)
CO2: 26 mmol/L (ref 22–32)
Calcium: 9 mg/dL (ref 8.9–10.3)
Chloride: 106 mmol/L (ref 101–111)
Creatinine, Ser: 0.82 mg/dL (ref 0.44–1.00)
GFR calc Af Amer: >60 mL/min (ref >60)
GLUCOSE: 71 mg/dL (ref 65–99)
Potassium: 3.3 mmol/L — ABNORMAL LOW (ref 3.5–5.1)
Sodium: 140 mmol/L (ref 135–145)

## 2014-12-29 NOTE — OR Nursing (Signed)
Notified Kalita at Dr. Apolinar JunesBrandon office of inabilty to collect UA and culture (patient son expressed this is normally collected via in/out cath.

## 2015-01-04 ENCOUNTER — Encounter
Admission: RE | Disposition: A | Discharge status: Home / Self Care | Payer: Self-pay | Source: Ambulatory Visit | Attending: Urology

## 2015-01-04 ENCOUNTER — Ambulatory Visit: Payer: Medicare Other | Admitting: Anesthesiology

## 2015-01-04 ENCOUNTER — Encounter: Payer: Self-pay | Admitting: *Deleted

## 2015-01-04 ENCOUNTER — Ambulatory Visit
Admission: RE | Admit: 2015-01-04 | Discharge: 2015-01-04 | Disposition: A | Discharge status: Home / Self Care | Payer: Medicare Other | Source: Ambulatory Visit | Attending: Urology | Admitting: Urology

## 2015-01-04 DIAGNOSIS — N200 Calculus of kidney: Secondary | ICD-10-CM | POA: Diagnosis not present

## 2015-01-04 DIAGNOSIS — Z87891 Personal history of nicotine dependence: Secondary | ICD-10-CM | POA: Insufficient documentation

## 2015-01-04 DIAGNOSIS — F0280 Dementia in other diseases classified elsewhere without behavioral disturbance: Secondary | ICD-10-CM | POA: Diagnosis not present

## 2015-01-04 DIAGNOSIS — I10 Essential (primary) hypertension: Secondary | ICD-10-CM | POA: Diagnosis not present

## 2015-01-04 DIAGNOSIS — G309 Alzheimer's disease, unspecified: Secondary | ICD-10-CM | POA: Insufficient documentation

## 2015-01-04 DIAGNOSIS — N390 Urinary tract infection, site not specified: Secondary | ICD-10-CM | POA: Diagnosis not present

## 2015-01-04 DIAGNOSIS — Z86010 Personal history of colonic polyps: Secondary | ICD-10-CM | POA: Diagnosis not present

## 2015-01-04 DIAGNOSIS — Z411 Encounter for cosmetic surgery: Secondary | ICD-10-CM | POA: Diagnosis not present

## 2015-01-04 DIAGNOSIS — M1990 Unspecified osteoarthritis, unspecified site: Secondary | ICD-10-CM | POA: Diagnosis not present

## 2015-01-04 DIAGNOSIS — Z9889 Other specified postprocedural states: Secondary | ICD-10-CM | POA: Insufficient documentation

## 2015-01-04 HISTORY — PX: CYSTOSCOPY/URETEROSCOPY/HOLMIUM LASER/STENT PLACEMENT: SHX6546

## 2015-01-04 SURGERY — CYSTOSCOPY/URETEROSCOPY/HOLMIUM LASER/STENT PLACEMENT
Anesthesia: General | Laterality: Left | Wound class: Clean Contaminated

## 2015-01-04 MED ORDER — FENTANYL CITRATE (PF) 100 MCG/2ML IJ SOLN
INTRAMUSCULAR | Status: DC | PRN
  Administered 2015-01-04: 25 ug via INTRAVENOUS
  Administered 2015-01-04: 50 ug via INTRAVENOUS

## 2015-01-04 MED ORDER — PHENYLEPHRINE HCL 10 MG/ML IJ SOLN
INTRAMUSCULAR | Status: DC | PRN
  Administered 2015-01-04 (×4): 50 ug via INTRAVENOUS

## 2015-01-04 MED ORDER — LEVOFLOXACIN IN D5W 500 MG/100ML IV SOLN
INTRAVENOUS | Status: AC
  Administered 2015-01-04: 500 mg via INTRAVENOUS
  Filled 2015-01-04: qty 100

## 2015-01-04 MED ORDER — HYDROCODONE-ACETAMINOPHEN 5-325 MG PO TABS: 1.0000 | ORAL_TABLET | Freq: Four times a day (QID) | ORAL | Status: DC | PRN

## 2015-01-04 MED ORDER — IOTHALAMATE MEGLUMINE 43 % IV SOLN
INTRAVENOUS | Status: DC | PRN
  Administered 2015-01-04: 15 mL

## 2015-01-04 MED ORDER — SODIUM CHLORIDE 0.9 % IR SOLN
Status: DC | PRN
  Administered 2015-01-04: 1500 mL/h

## 2015-01-04 MED ORDER — ONDANSETRON HCL 4 MG/2ML IJ SOLN: 4.0000 mg | Freq: Once | INTRAMUSCULAR | Status: DC | PRN

## 2015-01-04 MED ORDER — ONDANSETRON HCL 4 MG/2ML IJ SOLN
INTRAMUSCULAR | Status: DC | PRN
  Administered 2015-01-04: 4 mg via INTRAVENOUS

## 2015-01-04 MED ORDER — LEVOFLOXACIN IN D5W 500 MG/100ML IV SOLN
500.0000 mg | INTRAVENOUS | Status: AC
  Administered 2015-01-04: 500 mg via INTRAVENOUS

## 2015-01-04 MED ORDER — FENTANYL CITRATE (PF) 100 MCG/2ML IJ SOLN: 25.0000 ug | INTRAMUSCULAR | Status: DC | PRN

## 2015-01-04 MED ORDER — PROPOFOL 10 MG/ML IV BOLUS
INTRAVENOUS | Status: DC | PRN
  Administered 2015-01-04: 40 mg via INTRAVENOUS
  Administered 2015-01-04: 120 mg via INTRAVENOUS

## 2015-01-04 MED ORDER — SUCCINYLCHOLINE CHLORIDE 20 MG/ML IJ SOLN
INTRAMUSCULAR | Status: DC | PRN
  Administered 2015-01-04: 100 mg via INTRAVENOUS

## 2015-01-04 MED ORDER — LACTATED RINGERS IV SOLN
INTRAVENOUS | Status: DC
  Administered 2015-01-04 (×2): via INTRAVENOUS

## 2015-01-04 SURGICAL SUPPLY — 35 items
ADAPTER SCOPE UROLOK II (MISCELLANEOUS) IMPLANT
BAG DRAIN CYSTO-URO LG1000N (MISCELLANEOUS) ×3 IMPLANT
BASKET NITINOL 4 WIRE 16 (BASKET) IMPLANT
BASKET NITINOL 4 WIRE 16MM (BASKET)
BASKET ZERO TIP 1.9FR (BASKET) ×3 IMPLANT
BULB IRRIG PATHFIND (MISCELLANEOUS) IMPLANT
CATH URETL 5X70 OPEN END (CATHETERS) ×3 IMPLANT
CNTNR SPEC 2.5X3XGRAD LEK (MISCELLANEOUS) ×1
CONRAY 43 FOR UROLOGY 50M (MISCELLANEOUS) ×3 IMPLANT
CONT SPEC 4OZ STER OR WHT (MISCELLANEOUS) ×2
CONTAINER SPEC 2.5X3XGRAD LEK (MISCELLANEOUS) ×1 IMPLANT
GLOVE BIO SURGEON STRL SZ 6.5 (GLOVE) ×2 IMPLANT
GLOVE BIO SURGEON STRL SZ7 (GLOVE) ×6 IMPLANT
GLOVE BIO SURGEONS STRL SZ 6.5 (GLOVE) ×1
GOWN STRL REUS W/ TWL LRG LVL3 (GOWN DISPOSABLE) ×2 IMPLANT
GOWN STRL REUS W/TWL LRG LVL3 (GOWN DISPOSABLE) ×4
GUIDEWIRE SUPER STIFF (WIRE) ×3 IMPLANT
INTRODUCER DILATOR DOUBLE (INTRODUCER) ×3 IMPLANT
JELLY LUB 2OZ STRL (MISCELLANEOUS) ×2
JELLY LUBE 2OZ STRL (MISCELLANEOUS) ×1 IMPLANT
LASER HOLMIUM SU 200UM (MISCELLANEOUS) ×3 IMPLANT
LASER HOLMIUM SU 940UM (MISCELLANEOUS) ×3 IMPLANT
PACK CYSTO AR (MISCELLANEOUS) ×3 IMPLANT
PREP PVP WINGED SPONGE (MISCELLANEOUS) ×3 IMPLANT
PUMP SINGLE ACTION SAP (PUMP) ×3 IMPLANT
SENSORWIRE 0.038 NOT ANGLED (WIRE) ×3
SET CYSTO W/LG BORE CLAMP LF (SET/KITS/TRAYS/PACK) ×3 IMPLANT
SHEATH URETERAL 12FRX35CM (MISCELLANEOUS) ×3 IMPLANT
SOL .9 NS 3000ML IRR  AL (IV SOLUTION) ×2
SOL .9 NS 3000ML IRR UROMATIC (IV SOLUTION) ×1 IMPLANT
STENT URET 6FRX24 CONTOUR (STENTS) ×3 IMPLANT
STENT URETERAL METAL 6X24 (Stent) ×3 IMPLANT
TUBING PRES M/F 48IN 4237111 (TUBING) ×3 IMPLANT
WATER STERILE IRR 1000ML POUR (IV SOLUTION) ×3 IMPLANT
WIRE SENSOR 0.038 NOT ANGLED (WIRE) ×1 IMPLANT

## 2015-01-04 NOTE — Anesthesia Preprocedure Evaluation (Addendum)
Anesthesia Evaluation  Patient identified by MRN, date of birth, ID band Patient awake    Airway Mallampati: II  TM Distance: >3 FB Neck ROM: Full    Dental   Pulmonary former smoker,  breath sounds clear to auscultation        Cardiovascular hypertension, Rhythm:Regular Rate:Normal     Neuro/Psych PSYCHIATRIC DISORDERS Mild dementia    GI/Hepatic negative GI ROS,   Endo/Other    Renal/GU L renal stone     Musculoskeletal  (+) Arthritis -,   Abdominal Normal abdominal exam  (+)   Peds negative pediatric ROS (+)  Hematology  (+) anemia ,   Anesthesia Other Findings   Reproductive/Obstetrics                           Anesthesia Physical Anesthesia Plan  ASA: III  Anesthesia Plan: General   Post-op Pain Management:    Induction: Intravenous  Airway Management Planned: Oral ETT  Additional Equipment:   Intra-op Plan:   Post-operative Plan: Extubation in OR  Informed Consent: I have reviewed the patients History and Physical, chart, labs and discussed the procedure including the risks, benefits and alternatives for the proposed anesthesia with the patient or authorized representative who has indicated his/her understanding and acceptance.   Dental advisory given  Plan Discussed with: CRNA and Surgeon  Anesthesia Plan Comments:        Anesthesia Quick Evaluation

## 2015-01-04 NOTE — Brief Op Note (Signed)
01/04/2015  10:36 AM  PATIENT:  Odessa Flemingachel E Cloninger  74 y.o. female  PRE-OPERATIVE DIAGNOSIS:  LEFT RENAL STONE  POST-OPERATIVE DIAGNOSIS:  LEFT RENAL STONE  PROCEDURE:  Procedure(s): CYSTOSCOPY/LEFTURETEROSCOPY/HOLMIUM LASER/LEFT STENT PLACEMENT/LEFT RETROGRADE (Left)  SURGEON:  Surgeon(s) and Role:    * Vanna ScotlandAshley Bellagrace Sylvan, MD - Primary  ASSISTANTS: none   ANESTHESIA:   none  EBL:  Total I/O In: 500 [I.V.:500] Out: -   6 x 24 Fr JJ ureteral stent placement on left (no string)  stone fragment  COUNTS:  YES  PLAN OF CARE: Discharge to home after PACU  PATIENT DISPOSITION:  PACU - hemodynamically stable.

## 2015-01-04 NOTE — Discharge Instructions (Signed)
You have a ureteral stent in place.  This is a tube that extends from your kidney to your bladder.  This may cause urinary bleeding, burning with urination, and urinary frequency.  Please call our office or present to the ED if you develop fevers >101 or pain which is not able to be controlled with oral pain medications.  You may be given either Flomax and/ or ditropan to help with bladder spasms and stent pain in addition to pain medications.   ° °[facility] °[address], [address] °[place], [place] [address] °[phone] °

## 2015-01-04 NOTE — Op Note (Signed)
Date of procedure: 01/04/2015  Preoperative diagnosis:  1. Left kidney stone 2. UTIs   Postoperative diagnosis:  1. Same as above   Procedure: 1. Left ureteroscopy with laser lithotripsy 2. Left retrograde pyelogram 3. Left ureteral stent placement  Surgeon: Vanna ScotlandAshley Avi Archuleta, MD  Anesthesia: General  Complications: None  Intraoperative findings: Large 1.8 cm kidney stone located in a anterior lower pole the kidney  EBL: Minimal  Specimens: stone fragment  Drains: 6 x 24 French double-J ureteral stent on left (no string)  Indication: Kelli Matthews is a 74 y.o. patient with 1.8 cm left-sided kidney stone.  After reviewing the management options for treatment, Kelli Matthews elected to proceed with the above surgical procedure(s). We have discussed the potential benefits and risks of the procedure, side effects of the proposed treatment, the likelihood of the patient achieving the goals of the procedure, and any potential problems that might occur during the procedure or recuperation. Informed consent has been obtained.  Description of procedure:  The patient was taken to the operating room and general anesthesia was induced.  The patient was placed in the dorsal lithotomy position, prepped and draped in the usual sterile fashion, and preoperative antibiotics were administered. A preoperative time-out was performed.   At this point in time, a rigid 22 French cystoscope was advanced per urethra into the bladder. Attention was turned to the left ureteral orifice which was cannulated using a 5 [place] open-ended ureteral catheter just within the UO. A sensor wire was placed up to the level of the kidney under fluoroscopic guidance without difficulty. Of note within the renal shadow, a stone could be identified in the mid lower pole of the kidney. A dual lumen access sheath was used just within the distal ureter to introduce a second safety wire to the level of the  kidney which advanced without difficulty. The sensor wire was snapped in place as a safety wire and a Cook ureteral access sheath was advanced over the Super Stiff wire to the level of the proximal ureter. The inner sheath was removed and a 8 French flexible ureteroscope was advanced through the sheath level of the renal pelvis. All calyces were then directly visualized. The large 1.8 cm stone was identified and a lower anterior pole which was somewhat tricky to access. A 365  laser fiber was then used to fragment the stone into dust like particles using the settings of 0.2 J and 50 Hz. There were some larger fragments each of which were basketed out using a 1.9 [place]French nitinol tip basket. Given the size of the stone, there was significant stone debris which scattered throughout the kidney. The Sallee LangeDoherty of this was basketed out and at this point she was approximately 70% stone free. Given the burden and poor visualization at this point, the decision was made to proceed with a stage procedure bring the patient back at a later date to clear out the residual stone fragments.  The scope was backed to level of the proximal ureter and contrast was injected to the scope to perform a retrograde pyelogram. Retrograde on the left side revealed a decompressed collecting system without any significant filling defects which created a roadmap of the upper tract. Each of the calyces were then directly visualized and there was some stone burden but none of significant size noted. The access sheath was then scoped out under direct visualization inspecting the length of the ureter along the way. There was no ureteral trauma noted  or ureteral fragments.  A 6 x 24 French double-J ureteral stent was then advanced over the sensor wire to the level of the kidney. The wire was partially withdrawn and a coil was noted within the renal pelvis. The wire was then fully withdrawn and a coil was noted within the bladder.  The bladder was then  drained using a cystoscope access sheath.  The patient was then repositioned in the supine position, reversed from anesthesia, taken to the PACU in stable condition. There are no common locations of this case.  Plan: I discussed with the patient's son they will bring Kelli back to the operating room for a staged procedure to remove any residual stone fragments in approximate 4-6 weeks. He was agreeable with this plan.  Vanna ScotlandAshley Mitzy Naron, M.D.

## 2015-01-04 NOTE — Anesthesia Procedure Notes (Signed)
Procedure Name: Intubation Date/Time: 01/04/2015 9:00 AM Performed by: Lily KocherPERALTA, Kelli Matthews Patient Re-evaluated:Patient Re-evaluated prior to inductionOxygen Delivery Method: Circle system utilized Preoxygenation: Pre-oxygenation with 100% oxygen Intubation Type: IV induction Ventilation: Mask ventilation without difficulty Laryngoscope Size: Mac and 3 Grade View: Grade II Tube type: Oral Tube size: 7.0 mm Number of attempts: 1 Airway Equipment and Method: Stylet Placement Confirmation: ETT inserted through vocal cords under direct vision,  positive ETCO2 and breath sounds checked- equal and bilateral Secured at: 21 cm Tube secured with: Tape Dental Injury: Teeth and Oropharynx as per pre-operative assessment

## 2015-01-04 NOTE — H&P (Signed)
Urology Admission H&P  Chief Complaint: Left kidney stone   History of Present Illness: 74 y.o.F with a 1.8 cm left UPJ stone. This was discovered incidentally when she was taken and treated by the emergency room for urinary tract infection.   Past Medical History  Diagnosis Date  . Dementia 2012  . Maxillary sinus fracture     after fall 2013, with LOC  . Hypertension 2001    on medicaitons for at least 10 years  . Other esophagitis 2011  . Personal history of tobacco use, presenting hazards to health 2011  . H/O cystitis 2010  . Arthritis 2005  . Rectal mass 2010  . Alzheimer disease    Past Surgical History  Procedure Laterality Date  . Breast reduction surgery  2003  . Breast lumpectomy  1985    Benign per pt, right breast  . Colonoscopy  2004, 2010,2011  . Breast reduction surgery  2003or 2004    by Dr. Genevieve NorlanderAu  . Colon surgery  2010    polyps, Dr. Lemar LivingsByrnett, hemicolectomy  . Vein ligation and stripping  2005  . Wrist surgery  2005  . Upper gi endoscopy  2011  . Varicose veins  2011    laser surgery    Home Medications:  Prescriptions prior to admission  Medication Sig Dispense Refill Last Dose  . acetaminophen (TYLENOL) 325 MG tablet Take 650 mg by mouth every 4 (four) hours as needed for mild pain or fever.   Past Month at Unknown time  . aspirin EC 81 MG tablet Take 81 mg by mouth daily.   12/28/2014  . cholecalciferol (VITAMIN D) 1000 UNITS tablet TAKE ONE TABLET BY MOUTH EACH DAY. 30 tablet PRN 01/03/2015 at Unknown time  . citalopram (CELEXA) 20 MG tablet Take 1 tablet by mouth daily.   01/03/2015 at Unknown time  . desonide (DESOWEN) 0.05 % cream Apply 1 application topically as needed (to face).    Taking  . donepezil (ARICEPT) 10 MG tablet TAKE ONE TABLET BY MOUTH EACH DAY AT BEDTIME. 30 tablet 11 01/03/2015 at Unknown time  . fluticasone (FLONASE) 50 MCG/ACT nasal spray Two sprays in each nostril daily. 16 g 6 01/03/2015 at Unknown time  . gentamicin cream (GARAMYCIN)  0.1 % APPLY TWICE DAILY TO SKIN LESIONS ON UPPER BACK 30 g 1  at unknown  . Infant Care Products (DERMACLOUD) CREA Apply 1 application topically as needed (apply to bottom).     at unknown  . lisinopril-hydrochlorothiazide (PRINZIDE,ZESTORETIC) 10-12.5 MG per tablet Take 1 tablet by mouth daily.   01/03/2015 at Unknown time  . loperamide (IMODIUM A-D) 2 MG tablet TAKE 1 TAB PO AFTER EACH LOOSE STOOL. (MAX 3/12HR) IF PROBLEM PERSISTS AFTER 12 HOURS, CALL MD. 30 tablet 6  at unknown  . memantine (NAMENDA) 10 MG tablet Take 10 mg by mouth 2 (two) times daily.   01/04/2015 at 645  . metoprolol succinate (TOPROL-XL) 25 MG 24 hr tablet Take 1 tablet (25 mg total) by mouth daily. 30 tablet 6 01/04/2015 at 645  . mirtazapine (REMERON) 15 MG tablet Take 30 mg by mouth at bedtime.    01/03/2015 at Unknown time  . Multiple Vitamins-Minerals (THERAVIM-M) TABS TAKE ONE TABLET BY MOUTH EACH DAY. 30 tablet 5 01/03/2015 at Unknown time  . psyllium (METAMUCIL) 58.6 % powder Take 1 packet by mouth daily. 15 mL daily   01/03/2015 at Unknown time  . QUEtiapine (SEROQUEL) 25 MG tablet Take 25 mg by mouth 3 (three)  times daily as needed (agitation).    at Unknown time  . QUEtiapine (SEROQUEL) 50 MG tablet Take 50 mg by mouth at bedtime.   01/03/2015 at Unknown time  . triamcinolone cream (KENALOG) 0.1 % Apply 1 application topically as needed (apply to upper back and chest as needed for redness).     at unknown  . colestipol (COLESTID) 1 G tablet Take 1 g by mouth 2 (two) times daily as needed.    Not Taking at Unknown time  . polyethylene glycol powder (GLYCOLAX/MIRALAX) powder 255 grams one bottle for colonoscopy prep (Patient not taking: Reported on 12/23/2014) 255 g 0 Not Taking at Unknown time   Allergies:  Allergies  Allergen Reactions  . Augmentin [Amoxicillin-Pot Clavulanate] Rash  . Latex Rash    Family History  Problem Relation Age of Onset  . Alzheimer's disease Mother   . Stroke Mother   . Ovarian cancer Sister    . Early death Sister [id number]  . Depression Son    Social History:  reports that she quit smoking about 19 years ago. She has never used smokeless tobacco. She reports that she does not drink alcohol or use illicit drugs.  Review of Systems  Unable to perform ROS: dementia    Physical Exam:  Vital signs in last 24 hours:   Physical Exam  Constitutional: She is oriented to person, place, and time. She appears well-developed and well-nourished.  HENT:  Head: Atraumatic.  Cardiovascular: Normal rate and regular rhythm.   Respiratory: Effort normal and breath sounds normal.  GI: Soft. She exhibits no distension. There is no tenderness. There is no rebound.  Musculoskeletal: She exhibits no edema.  Neurological: She is alert and oriented to person, place, and time.  Skin: Skin is warm and dry.    Laboratory Data:  No results found for this or any previous visit (from the past 24 hour(s)). No results found for this or any previous visit (from the past 240 hour(s)). Creatinine:  Recent Labs  12/28/14 1451  CREATININE 0.82    Impression/Assessment:  1.8 cm left UPJ stone  Plan:  Plan for staged left ureteroscopy, laser lithotripsy, left ureteral stent placement. Informed consent obtained from patient's healthcare firm attorney, her son who is here with her today as well as [place] in [place].  Vanna ScotlandBRANDON, Kenlynn Houde 01/04/2015, 7:38 AM

## 2015-01-04 NOTE — Anesthesia Postprocedure Evaluation (Signed)
  Anesthesia Post-op Note  Patient: Kelli FlemingRachel E Matthews  Procedure(s) Performed: Procedure(s): CYSTOSCOPY/LEFTURETEROSCOPY/HOLMIUM LASER/LEFT STENT PLACEMENT/LEFT RETROGRADE (Left)  Anesthesia type:General  Patient location: PACU  Post pain: Pain level controlled  Post assessment: Post-op Vital signs reviewed, Patient's Cardiovascular Status Stable, Respiratory Function Stable, Patent Airway and No signs of Nausea or vomiting  Post vital signs: Reviewed and stable  Last Vitals:  Filed Vitals:   01/04/15 1015  BP: 163/66  Pulse: 74  Temp: 97.1  Resp: 14    Level of consciousness: awake, alert  and patient cooperative  Complications: No apparent anesthesia complications

## 2015-01-04 NOTE — Transfer of Care (Signed)
Immediate Anesthesia Transfer of Care Note  Patient: Kelli FlemingRachel E Carriveau  Procedure(s) Performed: Procedure(s): CYSTOSCOPY/LEFTURETEROSCOPY/HOLMIUM LASER/LEFT STENT PLACEMENT/LEFT RETROGRADE (Left)  Patient Location: PACU  Anesthesia Type:General  Level of Consciousness: awake and responds to stimulation  Airway & Oxygen Therapy: Patient Spontanous Breathing and Patient connected to face mask oxygen  Post-op Assessment: Report given to RN  Post vital signs: Reviewed and stable  Last Vitals:  Filed Vitals:   01/04/15 1010  BP: 163/66  Pulse: 75  Temp: 36.2 C  Resp: 16    Complications: No apparent anesthesia complications

## 2015-01-06 ENCOUNTER — Encounter: Payer: Self-pay | Admitting: Urology

## 2015-01-20 ENCOUNTER — Encounter
Admission: RE | Admit: 2015-01-20 | Discharge: 2015-01-20 | Disposition: A | Discharge status: Home / Self Care | Payer: Medicare Other | Source: Ambulatory Visit | Attending: Urology | Admitting: Urology

## 2015-01-20 DIAGNOSIS — E876 Hypokalemia: Secondary | ICD-10-CM | POA: Diagnosis not present

## 2015-01-20 DIAGNOSIS — A047 Enterocolitis due to Clostridium difficile: Secondary | ICD-10-CM | POA: Diagnosis not present

## 2015-01-20 DIAGNOSIS — Z01812 Encounter for preprocedural laboratory examination: Secondary | ICD-10-CM

## 2015-01-20 DIAGNOSIS — N200 Calculus of kidney: Secondary | ICD-10-CM | POA: Insufficient documentation

## 2015-01-20 DIAGNOSIS — Z01810 Encounter for preprocedural cardiovascular examination: Secondary | ICD-10-CM

## 2015-01-20 HISTORY — DX: Chronic kidney disease, unspecified: N18.9

## 2015-01-20 HISTORY — DX: Anemia, unspecified: D64.9

## 2015-01-20 LAB — CBC
HCT: 30.5 % — ABNORMAL LOW (ref 35.0–47.0)
Hemoglobin: 9.7 g/dL — ABNORMAL LOW (ref 12.0–16.0)
MCH: 27.1 pg (ref 26.0–34.0)
MCHC: 31.9 g/dL — ABNORMAL LOW (ref 32.0–36.0)
MCV: 85 fL (ref 80.0–100.0)
PLATELETS: 335 10*3/uL (ref 150–440)
RBC: 3.59 MIL/uL — ABNORMAL LOW (ref 3.80–5.20)
RDW: 14.2 % (ref 11.5–14.5)
WBC: 14 10*3/uL — ABNORMAL HIGH (ref 3.6–11.0)

## 2015-01-20 LAB — POTASSIUM: Potassium: 3.2 mmol/L — ABNORMAL LOW (ref 3.5–5.1)

## 2015-01-20 NOTE — Patient Instructions (Signed)
  Your procedure is scheduled on: January 31, 2015 (Monday) Report to Same Day Surgery. To find out your arrival time please call [phone] between 1PM - 3PM on January 28 2015 (Friday) Remember: Instructions that are not followed completely may result in serious medical risk, up to and including death, or upon the discretion of your surgeon and anesthesiologist your surgery may need to be rescheduled.    __x__ 1. Do not eat food or drink liquids after midnight. No gum chewing or hard candies.     ____ 2. No Alcohol for 24 hours before or after surgery.   ____ 3. Bring all medications with you on the day of surgery if instructed.    __x 4. Notify your doctor if there is any change in your medical condition     (cold, fever, infections).     Do not wear jewelry, make-up, hairpins, clips or nail polish.  Do not wear lotions, powders, or perfumes. You may wear deodorant.  Do not shave 48 hours prior to surgery. Men may shave face and neck.  Do not bring valuables to the hospital.    [facility] any belongings or valuables.               Contacts, dentures or bridgework may not be worn into surgery.  Leave your suitcase in the car. After surgery it may be brought to your room.  For patients admitted to the hospital, discharge time is determined by your                treatment team.   Patients discharged the day of surgery will not be allowed to drive home.   Please read over the following fact sheets that you were given:     __x__ Take these medicines the morning of surgery with A SIP OF WATER:    1.Metoprolol  2.Namenda  3.   4.  5.  6.  ____ Fleet Enema (as directed)   ____ Use CHG Soap as directed  ____ Use inhalers on the day of surgery  ____ Stop metformin 2 days prior to surgery    ____ Take 1/2 of usual insulin dose the night before surgery and none on the morning of surgery.   __x__ Stop Coumadin/Plavix/aspirin on (five days prior to  surgery) ____ Stop Anti-inflammatories on    ____ Stop supplements until after surgery.    ____ Bring C-Pap to the hospital.   .   __   __ _

## 2015-01-21 ENCOUNTER — Telehealth: Payer: Self-pay | Admitting: *Deleted

## 2015-01-21 ENCOUNTER — Encounter: Payer: Self-pay | Admitting: *Deleted

## 2015-01-21 DIAGNOSIS — E785 Hyperlipidemia, unspecified: Secondary | ICD-10-CM | POA: Insufficient documentation

## 2015-01-21 DIAGNOSIS — S82899A Other fracture of unspecified lower leg, initial encounter for closed fracture: Secondary | ICD-10-CM | POA: Insufficient documentation

## 2015-01-21 NOTE — Telephone Encounter (Signed)
[facility] just had [facility] for stent placement, now running a fever give the pt tylenol.  Per Lyla Sonarrie, advised [facility] to contact the surgeon's office for complications due from surgery.

## 2015-01-21 NOTE — OR Nursing (Signed)
Kt faxed to Dr Apolinar JunesBrandon with request to start supplement. Recheck am surgery

## 2015-01-22 ENCOUNTER — Emergency Department: Payer: Medicare Other

## 2015-01-22 ENCOUNTER — Other Ambulatory Visit: Payer: Self-pay

## 2015-01-22 ENCOUNTER — Inpatient Hospital Stay
Admission: EM | Admit: 2015-01-22 | Discharge: 2015-02-04 | DRG: 371 | Disposition: A | Discharge status: Skilled Nursing Facility | Payer: Medicare Other | Attending: Internal Medicine | Admitting: Internal Medicine

## 2015-01-22 ENCOUNTER — Encounter: Payer: Self-pay | Admitting: Emergency Medicine

## 2015-01-22 DIAGNOSIS — Z86010 Personal history of colonic polyps: Secondary | ICD-10-CM | POA: Diagnosis not present

## 2015-01-22 DIAGNOSIS — K7689 Other specified diseases of liver: Secondary | ICD-10-CM | POA: Diagnosis present

## 2015-01-22 DIAGNOSIS — K851 Biliary acute pancreatitis without necrosis or infection: Secondary | ICD-10-CM

## 2015-01-22 DIAGNOSIS — N390 Urinary tract infection, site not specified: Secondary | ICD-10-CM | POA: Diagnosis present

## 2015-01-22 DIAGNOSIS — Z87440 Personal history of urinary (tract) infections: Secondary | ICD-10-CM | POA: Diagnosis not present

## 2015-01-22 DIAGNOSIS — Z8041 Family history of malignant neoplasm of ovary: Secondary | ICD-10-CM

## 2015-01-22 DIAGNOSIS — Z66 Do not resuscitate: Secondary | ICD-10-CM | POA: Diagnosis present

## 2015-01-22 DIAGNOSIS — D649 Anemia, unspecified: Secondary | ICD-10-CM | POA: Diagnosis present

## 2015-01-22 DIAGNOSIS — R7989 Other specified abnormal findings of blood chemistry: Secondary | ICD-10-CM | POA: Diagnosis present

## 2015-01-22 DIAGNOSIS — Z87898 Personal history of other specified conditions: Secondary | ICD-10-CM

## 2015-01-22 DIAGNOSIS — Z7982 Long term (current) use of aspirin: Secondary | ICD-10-CM | POA: Diagnosis not present

## 2015-01-22 DIAGNOSIS — Z880 Allergy status to penicillin: Secondary | ICD-10-CM

## 2015-01-22 DIAGNOSIS — K859 Acute pancreatitis without necrosis or infection, unspecified: Secondary | ICD-10-CM | POA: Diagnosis present

## 2015-01-22 DIAGNOSIS — Z791 Long term (current) use of non-steroidal anti-inflammatories (NSAID): Secondary | ICD-10-CM

## 2015-01-22 DIAGNOSIS — Z87891 Personal history of nicotine dependence: Secondary | ICD-10-CM | POA: Diagnosis not present

## 2015-01-22 DIAGNOSIS — I129 Hypertensive chronic kidney disease with stage 1 through stage 4 chronic kidney disease, or unspecified chronic kidney disease: Secondary | ICD-10-CM | POA: Diagnosis present

## 2015-01-22 DIAGNOSIS — R4701 Aphasia: Secondary | ICD-10-CM | POA: Diagnosis present

## 2015-01-22 DIAGNOSIS — G309 Alzheimer's disease, unspecified: Secondary | ICD-10-CM | POA: Diagnosis present

## 2015-01-22 DIAGNOSIS — Z515 Encounter for palliative care: Secondary | ICD-10-CM | POA: Diagnosis not present

## 2015-01-22 DIAGNOSIS — Z91040 Latex allergy status: Secondary | ICD-10-CM

## 2015-01-22 DIAGNOSIS — B952 Enterococcus as the cause of diseases classified elsewhere: Secondary | ICD-10-CM | POA: Diagnosis present

## 2015-01-22 DIAGNOSIS — F329 Major depressive disorder, single episode, unspecified: Secondary | ICD-10-CM | POA: Diagnosis present

## 2015-01-22 DIAGNOSIS — F03918 Unspecified dementia, unspecified severity, with other behavioral disturbance: Secondary | ICD-10-CM | POA: Diagnosis present

## 2015-01-22 DIAGNOSIS — M1990 Unspecified osteoarthritis, unspecified site: Secondary | ICD-10-CM | POA: Diagnosis present

## 2015-01-22 DIAGNOSIS — Z7951 Long term (current) use of inhaled steroids: Secondary | ICD-10-CM

## 2015-01-22 DIAGNOSIS — Z823 Family history of stroke: Secondary | ICD-10-CM | POA: Diagnosis not present

## 2015-01-22 DIAGNOSIS — E876 Hypokalemia: Secondary | ICD-10-CM | POA: Diagnosis present

## 2015-01-22 DIAGNOSIS — R945 Abnormal results of liver function studies: Secondary | ICD-10-CM

## 2015-01-22 DIAGNOSIS — Z9049 Acquired absence of other specified parts of digestive tract: Secondary | ICD-10-CM | POA: Diagnosis present

## 2015-01-22 DIAGNOSIS — A047 Enterocolitis due to Clostridium difficile: Principal | ICD-10-CM | POA: Diagnosis present

## 2015-01-22 DIAGNOSIS — F0280 Dementia in other diseases classified elsewhere without behavioral disturbance: Secondary | ICD-10-CM | POA: Diagnosis present

## 2015-01-22 DIAGNOSIS — N189 Chronic kidney disease, unspecified: Secondary | ICD-10-CM | POA: Diagnosis present

## 2015-01-22 DIAGNOSIS — R319 Hematuria, unspecified: Secondary | ICD-10-CM

## 2015-01-22 DIAGNOSIS — R7881 Bacteremia: Secondary | ICD-10-CM | POA: Diagnosis present

## 2015-01-22 DIAGNOSIS — N132 Hydronephrosis with renal and ureteral calculous obstruction: Secondary | ICD-10-CM | POA: Diagnosis present

## 2015-01-22 DIAGNOSIS — Z79899 Other long term (current) drug therapy: Secondary | ICD-10-CM

## 2015-01-22 DIAGNOSIS — E860 Dehydration: Secondary | ICD-10-CM | POA: Diagnosis present

## 2015-01-22 DIAGNOSIS — I10 Essential (primary) hypertension: Secondary | ICD-10-CM | POA: Diagnosis present

## 2015-01-22 DIAGNOSIS — B356 Tinea cruris: Secondary | ICD-10-CM | POA: Diagnosis present

## 2015-01-22 DIAGNOSIS — Z820 Family history of epilepsy and other diseases of the nervous system: Secondary | ICD-10-CM | POA: Diagnosis not present

## 2015-01-22 DIAGNOSIS — Z87442 Personal history of urinary calculi: Secondary | ICD-10-CM

## 2015-01-22 DIAGNOSIS — Z79891 Long term (current) use of opiate analgesic: Secondary | ICD-10-CM

## 2015-01-22 DIAGNOSIS — A0472 Enterocolitis due to Clostridium difficile, not specified as recurrent: Secondary | ICD-10-CM | POA: Diagnosis present

## 2015-01-22 DIAGNOSIS — F0391 Unspecified dementia with behavioral disturbance: Secondary | ICD-10-CM | POA: Diagnosis present

## 2015-01-22 LAB — URINALYSIS COMPLETE WITH MICROSCOPIC (ARMC ONLY)
Bilirubin Urine: NEGATIVE
Glucose, UA: NEGATIVE mg/dL
Ketones, ur: NEGATIVE mg/dL
Nitrite: NEGATIVE
PROTEIN: 100 mg/dL — AB
Specific Gravity, Urine: 1.016 (ref 1.005–1.030)
pH: 5 (ref 5.0–8.0)

## 2015-01-22 LAB — CBC WITH DIFFERENTIAL/PLATELET
Basophils Absolute: 0 10*3/uL (ref 0–0.1)
Basophils Relative: 0 %
Eosinophils Absolute: 0 10*3/uL (ref 0–0.7)
Eosinophils Relative: 0 %
HCT: 29.9 % — ABNORMAL LOW (ref 35.0–47.0)
Hemoglobin: 9.8 g/dL — ABNORMAL LOW (ref 12.0–16.0)
LYMPHS ABS: 1 10*3/uL (ref 1.0–3.6)
LYMPHS PCT: 9 %
MCH: 27.5 pg (ref 26.0–34.0)
MCHC: 32.7 g/dL (ref 32.0–36.0)
MCV: 84.2 fL (ref 80.0–100.0)
MONO ABS: 1 10*3/uL — AB (ref 0.2–0.9)
Monocytes Relative: 9 %
NEUTROS ABS: 9 10*3/uL — AB (ref 1.4–6.5)
Neutrophils Relative %: 82 %
PLATELETS: 312 10*3/uL (ref 150–440)
RBC: 3.55 MIL/uL — ABNORMAL LOW (ref 3.80–5.20)
RDW: 14.3 % (ref 11.5–14.5)
WBC: 11.1 10*3/uL — AB (ref 3.6–11.0)

## 2015-01-22 LAB — CLOSTRIDIUM DIFFICILE BY PCR: CDIFFPCR: POSITIVE — AB

## 2015-01-22 LAB — BASIC METABOLIC PANEL
Anion gap: 11 (ref 5–15)
BUN: 49 mg/dL — ABNORMAL HIGH (ref 6–20)
CALCIUM: 8.1 mg/dL — AB (ref 8.9–10.3)
CHLORIDE: 105 mmol/L (ref 101–111)
CO2: 23 mmol/L (ref 22–32)
CREATININE: 1.35 mg/dL — AB (ref 0.44–1.00)
GFR calc non Af Amer: 38 mL/min — ABNORMAL LOW (ref >60)
GFR, EST AFRICAN AMERICAN: 44 mL/min — AB (ref >60)
GLUCOSE: 169 mg/dL — AB (ref 65–99)
POTASSIUM: 2.1 mmol/L — AB (ref 3.5–5.1)
SODIUM: 139 mmol/L (ref 135–145)

## 2015-01-22 LAB — C DIFFICILE QUICK SCREEN W PCR REFLEX
C Diff antigen: POSITIVE
C Diff toxin: NEGATIVE

## 2015-01-22 LAB — HEPATIC FUNCTION PANEL
ALBUMIN: 2.9 g/dL — AB (ref 3.5–5.0)
ALT: 399 U/L — ABNORMAL HIGH (ref 14–54)
AST: 623 U/L — ABNORMAL HIGH (ref 15–41)
Alkaline Phosphatase: 121 U/L (ref 38–126)
BILIRUBIN TOTAL: 0.3 mg/dL (ref 0.3–1.2)
Bilirubin, Direct: 0.1 mg/dL (ref 0.1–0.5)
Indirect Bilirubin: 0.2 mg/dL — ABNORMAL LOW (ref 0.3–0.9)
Total Protein: 6.7 g/dL (ref 6.5–8.1)

## 2015-01-22 LAB — LIPASE, BLOOD: LIPASE: 164 U/L — AB (ref 22–51)

## 2015-01-22 MED ORDER — SODIUM CHLORIDE 0.9 % IV BOLUS (SEPSIS)
1000.0000 mL | Freq: Once | INTRAVENOUS | Status: AC
  Administered 2015-01-23: 1000 mL via INTRAVENOUS

## 2015-01-22 MED ORDER — CEFTRIAXONE SODIUM IN DEXTROSE 40 MG/ML IV SOLN
2.0000 g | Freq: Once | INTRAVENOUS | Status: AC
  Administered 2015-01-23: 2 g via INTRAVENOUS
  Filled 2015-01-22: qty 50

## 2015-01-22 MED ORDER — METRONIDAZOLE IN NACL 5-0.79 MG/ML-% IV SOLN
500.0000 mg | Freq: Three times a day (TID) | INTRAVENOUS | Status: DC
  Administered 2015-01-22 – 2015-01-23 (×2): 500 mg via INTRAVENOUS
  Filled 2015-01-22 (×5): qty 100

## 2015-01-22 MED ORDER — POTASSIUM CHLORIDE 10 MEQ/100ML IV SOLN
10.0000 meq | INTRAVENOUS | Status: AC
  Administered 2015-01-22 – 2015-01-23 (×6): 10 meq via INTRAVENOUS
  Filled 2015-01-22 (×6): qty 100

## 2015-01-22 MED ORDER — SODIUM CHLORIDE 0.9 % IV BOLUS (SEPSIS)
1000.0000 mL | Freq: Once | INTRAVENOUS | Status: AC
  Administered 2015-01-22: 1000 mL via INTRAVENOUS

## 2015-01-22 MED ORDER — METRONIDAZOLE IN NACL 5-0.79 MG/ML-% IV SOLN
INTRAVENOUS | Status: AC
  Administered 2015-01-22: 500 mg via INTRAVENOUS
  Filled 2015-01-22: qty 100

## 2015-01-22 NOTE — ED Provider Notes (Signed)
[facility] Emergency Department Provider Note  ____________________________________________  Time seen: 7:55 PM  I have reviewed the triage vital signs and the nursing notes.   HISTORY  Chief Complaint Diarrhea  history Limited by dementia and nonverbal at baseline Partial history obtained by daughter   HPI ENVI EAGLESON is a 74 y.o. female who is brought to the ED for diarrhea and malaise with decreased oral intake. Her daughter reports that the patient has recently had a urologic procedure with stent placement due to renal stones. She completed a course of Bactrim about 2 weeks ago.The patient had fever yesterday and the day before with a maximum of 101 that she was given Tylenol for. The daughter reports that she has not had any fever today. However, the patient has developed diarrhea today with 4 loose watery bowel movements that appeared to be nonbloody. No vomiting. No apparent chest pain or shortness of breath.  While at her nursing home, the patient was seated when she suddenly appeared to become unresponsive for several seconds. She did not become cyanotic or stop breathing, but the patient didn't apparently lose consciousness although she did not fall out of the chair or suffer any injury.    Past Medical History  Diagnosis Date  . Dementia 2012  . Maxillary sinus fracture     after fall 2013, with LOC  . Hypertension 2001    on medicaitons for at least 10 years  . Other esophagitis 2011  . Personal history of tobacco use, presenting hazards to health 2011  . H/O cystitis 2010  . Arthritis 2005  . Rectal mass 2010  . Alzheimer disease   . Chronic kidney disease   . Anemia   . Alzheimer disease   . Kidney stone   . Hypokalemia   . Hepatic cyst     Patient Active Problem List   Diagnosis Date Noted  . Ankle fracture 01/21/2015  . HLD (hyperlipidemia) 01/21/2015  . Anemia 12/31/2013  . Chronic diarrhea 11/05/2013  . Acute  pancreatitis 09/08/2013  . Personal history of colonic polyps 06/04/2013  . Sleep disturbance 01/26/2013  . Hypertension 09/27/2011  . Dementia with behavioral disturbance 09/27/2011    Past Surgical History  Procedure Laterality Date  . Breast reduction surgery  2003  . Breast lumpectomy  1985    Benign per pt, right breast  . Colonoscopy  2004, 2010,2011  . Breast reduction surgery  2003or 2004    by Dr. Genevieve Norlander  . Colon surgery  2010    polyps, Dr. Lemar Livings, hemicolectomy  . Vein ligation and stripping  2005  . Wrist surgery  2005  . Upper gi endoscopy  2011  . Varicose veins  2011    laser surgery  . Cystoscopy/ureteroscopy/holmium laser/stent placement Left 01/04/2015    Procedure: CYSTOSCOPY/LEFTURETEROSCOPY/HOLMIUM LASER/LEFT STENT PLACEMENT/LEFT RETROGRADE;  Surgeon: Vanna Scotland, MD;  Location: [facility] ORS;  Service: Urology;  Laterality: Left;    Current Outpatient Rx  Name  Route  Sig  Dispense  Refill  . acetaminophen (TYLENOL) 325 MG tablet   Oral   Take 650 mg by mouth every 4 (four) hours as needed for mild pain or fever.         Marland Kitchen aspirin EC 81 MG tablet   Oral   Take 81 mg by mouth daily.         . cholecalciferol (VITAMIN D) 1000 UNITS tablet      TAKE ONE TABLET BY MOUTH EACH DAY.  30 tablet   PRN   . citalopram (CELEXA) 20 MG tablet   Oral   Take 20 mg by mouth daily.          . colestipol (COLESTID) 1 G tablet   Oral   Take 1 g by mouth 2 (two) times daily as needed.          . desonide (DESOWEN) 0.05 % cream   Topical   Apply 1 application topically as needed (to face).          . donepezil (ARICEPT) 10 MG tablet      TAKE ONE TABLET BY MOUTH EACH DAY AT BEDTIME.   30 tablet   11   . fluticasone (FLONASE) 50 MCG/ACT nasal spray      Two sprays in each nostril daily.   16 g   6   . gentamicin cream (GARAMYCIN) 0.1 %      APPLY TWICE DAILY TO SKIN LESIONS ON UPPER BACK Patient taking differently: APPLY TWICE DAILY TO SKIN  LESIONS ON UPPER BACK AS NEEDED   30 g   1   . HYDROcodone-acetaminophen (NORCO/VICODIN) 5-325 MG per tablet   Oral   Take 1-2 tablets by mouth every 6 (six) hours as needed.   15 tablet   0   . Infant Care Products (DERMACLOUD) CREA   Apply externally   Apply 1 application topically as needed (apply to bottom).          Marland Kitchen. lisinopril-hydrochlorothiazide (PRINZIDE,ZESTORETIC) 10-12.5 MG per tablet   Oral   Take 1 tablet by mouth daily.         Marland Kitchen. loperamide (IMODIUM A-D) 2 MG tablet      TAKE 1 TAB PO AFTER EACH LOOSE STOOL. (MAX 3/12HR) IF PROBLEM PERSISTS AFTER 12 HOURS, CALL MD.   30 tablet   6   . memantine (NAMENDA) 10 MG tablet   Oral   Take 10 mg by mouth 2 (two) times daily.         . metoprolol succinate (TOPROL-XL) 25 MG 24 hr tablet   Oral   Take 1 tablet (25 mg total) by mouth daily.   30 tablet   6   . mirtazapine (REMERON) 15 MG tablet   Oral   Take 30 mg by mouth at bedtime.          . Multiple Vitamins-Minerals (THERAVIM-M) TABS      TAKE ONE TABLET BY MOUTH EACH DAY.   30 tablet   5   . psyllium (METAMUCIL) 58.6 % powder   Oral   Take 1 packet by mouth daily. 15 mL daily         . QUEtiapine (SEROQUEL) 25 MG tablet   Oral   Take 25 mg by mouth 3 (three) times daily as needed (agitation).         . QUEtiapine (SEROQUEL) 50 MG tablet   Oral   Take 50 mg by mouth at bedtime.         . triamcinolone cream (KENALOG) 0.1 %   Topical   Apply 1 application topically as needed (apply to upper back and chest as needed for redness).            Allergies Augmentin and Latex  Family History  Problem Relation Age of Onset  . Alzheimer's disease Mother   . Stroke Mother   . Ovarian cancer Sister   . Early death Sister [id number]  . Depression Son     Social  History History  Substance Use Topics  . Smoking status: Former Smoker -- 17 years    Types: Cigarettes    Quit date: 08/28/1995  . Smokeless tobacco: Never Used     Comment:  quit in 1977  . Alcohol Use: No    Review of Systems  Constitutional: Fever as above, poor oral intake and low energy for the past Eyes:No blurry vision or double vision.  ENT: No sore throat. Cardiovascular: No chest pain. Respiratory: No dyspnea or cough. Gastrointestinal: No apparent abdominal pain, positive diarrhea.  No BRBPR or melena. Genitourinary: Negative for dysuria, urinary retention, bloody urine, or difficulty urinating. Musculoskeletal: Negative for back pain. No joint swelling or pain. Skin: Negative for rash. Neurological: Negative for headaches, focal weakness or numbness. Psychiatric:No anxiety or depression.   Endocrine:No hot/cold intolerance, changes in energy, or sleep difficulty.  10-point ROS otherwise negative.  ____________________________________________   PHYSICAL EXAM:  VITAL SIGNS: ED Triage Vitals  Enc Vitals Group     BP 01/22/15 1949 144/55 mmHg     Pulse Rate 01/22/15 1949 91     Resp 01/22/15 1949 20     Temp 01/22/15 1949 98.3 F (36.8 C)     Temp Source 01/22/15 1949 Oral     SpO2 01/22/15 1949 97 %     Weight 01/22/15 1949 155 lb (70.308 kg)     Height 01/22/15 1949  (1.651 m)     Head Cir --      Peak Flow --      Pain Score --      Pain Loc --      Pain Edu? --      Excl. in GC? --      Constitutional: Lethargic, nonverbal, unable to assess orientation ill appearing but not in distress. Eyes: No scleral icterus. No conjunctival pallor. PERRL. EOMI ENT   Head: Normocephalic and atraumatic.   Nose: No congestion/rhinnorhea. No septal hematoma   Mouth/Throat: Dry mucous membranes, no pharyngeal erythema. No peritonsillar mass. No uvula shift.   Neck: No stridor. No SubQ emphysema. No meningismus. Hematological/Lymphatic/Immunilogical: No cervical lymphadenopathy. Cardiovascular: RRR. Normal and symmetric distal pulses are present in all extremities. No murmurs, rubs, or gallops. Respiratory: Normal  respiratory effort without tachypnea nor retractions. Breath sounds are clear and equal bilaterally. No wheezes/rales/rhonchi. Gastrointestinal: Soft and nontender. No distention. There is no CVA tenderness.  No rebound, rigidity, or guarding. Genitourinary: deferred Musculoskeletal: Nontender with normal range of motion in all extremities. No joint effusions.  No lower extremity tenderness.  No edema. Neurologic:   Nonverbal.  CN 2-10 normal. Motor grossly intact. Moves all extremities No gross focal neurologic deficits are appreciated.  Skin:  Skin is warm, dry and intact. No rash noted.  No petechiae, purpura, or bullae. Poor skin turgor ____________________________________________    LABS (pertinent positives/negatives) (all labs ordered are listed, but only abnormal results are displayed) Labs Reviewed  BASIC METABOLIC PANEL - Abnormal; Notable for the following:    Potassium 2.1 (*)    Glucose, Bld 169 (*)    BUN 49 (*)    Creatinine, Ser 1.35 (*)    Calcium 8.1 (*)    GFR calc non Af Amer 38 (*)    GFR calc Af Amer 44 (*)    All other components within normal limits  HEPATIC FUNCTION PANEL - Abnormal; Notable for the following:    Albumin 2.9 (*)    AST 623 (*)    ALT 399 (*)  Indirect Bilirubin 0.2 (*)    All other components within normal limits  LIPASE, BLOOD - Abnormal; Notable for the following:    Lipase 164 (*)    All other components within normal limits  CBC WITH DIFFERENTIAL/PLATELET - Abnormal; Notable for the following:    WBC 11.1 (*)    RBC 3.55 (*)    Hemoglobin 9.8 (*)    HCT 29.9 (*)    Neutro Abs 9.0 (*)    Monocytes Absolute 1.0 (*)    All other components within normal limits  URINALYSIS COMPLETEWITH MICROSCOPIC ([facility] ONLY) - Abnormal; Notable for the following:    Color, Urine YELLOW (*)    APPearance CLOUDY (*)    Hgb urine dipstick 3+ (*)    Protein, ur 100 (*)    Leukocytes, UA 1+ (*)    Bacteria, UA RARE (*)    Squamous Epithelial /  LPF 0-5 (*)    All other components within normal limits  C DIFFICILE QUICK SCAN W PCR REFLEX ([facility] ONLY)  URINE CULTURE  CULTURE, BLOOD (ROUTINE X 2)  CULTURE, BLOOD (ROUTINE X 2)  CLOSTRIDIUM DIFFICILE BY PCR (NOT AT [facility])   ____________________________________________   EKG  Interpreted by me Normal sinus rhythm rate in the 70s no acute ischemic changes  ____________________________________________    RADIOLOGY  Chest x-ray unremarkable Ultrasound abdomen pending  ____________________________________________   PROCEDURES CRITICAL CARE Performed by: Scotty Court, Viana Sleep   Total critical care time: 35 minutes  Critical care time was exclusive of separately billable procedures and treating other patients.  Critical care was necessary to treat or prevent imminent or life-threatening deterioration.  Critical care was time spent personally by me on the following activities: development of treatment plan with patient and/or surrogate as well as nursing, discussions with consultants, evaluation of patient's response to treatment, examination of patient, obtaining history from patient or surrogate, ordering and performing treatments and interventions, ordering and review of laboratory studies, ordering and review of radiographic studies, pulse oximetry and re-evaluation of patient's condition.  ____________________________________________   INITIAL IMPRESSION / ASSESSMENT AND PLAN / ED COURSE  Pertinent labs & imaging results that were available during my care of the patient were reviewed by me and considered in my medical decision making (see chart for details).  Patient presentation suggestive of dehydration, but due to her comorbidities and inability to provide any history directly, he will also check labs, C. difficile, urine to look for possible C. difficile infection pneumonia UTI or pyelonephritis.  ----------------------------------------- 10:44 PM on  01/22/2015 -----------------------------------------  Labs reveal multiple abnormalities. The patient has acute elevation of her hepatic function panel with AST and ALT elevated without elevation of bilirubin. Her lipase is also markedly elevated. This is consistent with pancreatitis and possibly choledocholithiasis. In addition the white blood cell count is on the high side of normal at 11. She is also severely hypokalemic with a potassium of 2.1 and has an acute renal insufficiency with a creatinine of 1.35 on a baseline of 0.81 and a high BUN of 49. The urinalysis reveals a large number of blood along with some white blood cells that may represent a urinary tract infection as well. Due to these findings, we'll continue giving the patient IV fluids and also start blood cultures and empiric antibiotics with ceftriaxone and Flagyl for likely urinary or intra-abdominal source. I'll proceed with a complete ultrasound series of the abdomen and plan for admission. The patient is receiving IV potassium repletion.  ____________________________________________   FINAL  CLINICAL IMPRESSION(S) / ED DIAGNOSES  Final diagnoses:  Hypokalemia  Acute biliary pancreatitis  Hematuria      Sharman Cheek, MD 01/22/15 2332

## 2015-01-22 NOTE — ED Notes (Signed)
Pt presents to ED via EMS with c/o of diarrhea, [facility]. EMS states pt has recently been diagnosed with a UTI, has been taking prescribed medications. EMS states initial call was for "unresponsiveness." EMS states [facility] staff noticed loose stools occurring for the past couple days. Family member states pt has always had loose stools. Pt arrived to ER treatment room, alert and non-communicative.

## 2015-01-23 LAB — COMPREHENSIVE METABOLIC PANEL
ALK PHOS: 90 U/L (ref 38–126)
ALT: 260 U/L — ABNORMAL HIGH (ref 14–54)
ANION GAP: 3 — AB (ref 5–15)
AST: 290 U/L — ABNORMAL HIGH (ref 15–41)
Albumin: 2.3 g/dL — ABNORMAL LOW (ref 3.5–5.0)
BILIRUBIN TOTAL: 0.2 mg/dL — AB (ref 0.3–1.2)
BUN: 34 mg/dL — ABNORMAL HIGH (ref 6–20)
CALCIUM: 7.5 mg/dL — AB (ref 8.9–10.3)
CO2: 23 mmol/L (ref 22–32)
Chloride: 113 mmol/L — ABNORMAL HIGH (ref 101–111)
Creatinine, Ser: 0.96 mg/dL (ref 0.44–1.00)
GFR calc Af Amer: >60 mL/min (ref >60)
GFR, EST NON AFRICAN AMERICAN: 57 mL/min — AB (ref >60)
Glucose, Bld: 92 mg/dL (ref 65–99)
POTASSIUM: 3 mmol/L — AB (ref 3.5–5.1)
Sodium: 139 mmol/L (ref 135–145)
Total Protein: 5.6 g/dL — ABNORMAL LOW (ref 6.5–8.1)

## 2015-01-23 LAB — CBC
HEMATOCRIT: 27 % — AB (ref 35.0–47.0)
Hemoglobin: 8.9 g/dL — ABNORMAL LOW (ref 12.0–16.0)
MCH: 28 pg (ref 26.0–34.0)
MCHC: 33 g/dL (ref 32.0–36.0)
MCV: 84.7 fL (ref 80.0–100.0)
Platelets: 282 10*3/uL (ref 150–440)
RBC: 3.18 MIL/uL — ABNORMAL LOW (ref 3.80–5.20)
RDW: 14.1 % (ref 11.5–14.5)
WBC: 9.9 10*3/uL (ref 3.6–11.0)

## 2015-01-23 MED ORDER — CITALOPRAM HYDROBROMIDE 20 MG PO TABS
20.0000 mg | ORAL_TABLET | Freq: Every day | ORAL | Status: DC
  Administered 2015-01-23 – 2015-02-04 (×13): 20 mg via ORAL
  Filled 2015-01-23 (×13): qty 1

## 2015-01-23 MED ORDER — DONEPEZIL HCL 5 MG PO TABS
10.0000 mg | ORAL_TABLET | Freq: Every day | ORAL | Status: DC
Start: 2015-01-23 — End: 2015-02-04
  Administered 2015-01-23 – 2015-02-03 (×13): 10 mg via ORAL
  Filled 2015-01-23 (×15): qty 2

## 2015-01-23 MED ORDER — CEFTRIAXONE SODIUM IN DEXTROSE 20 MG/ML IV SOLN
1.0000 g | INTRAVENOUS | Status: DC
  Administered 2015-01-23 – 2015-01-24 (×2): 1 g via INTRAVENOUS
  Filled 2015-01-23 (×4): qty 50

## 2015-01-23 MED ORDER — VANCOMYCIN HCL IN DEXTROSE 1-5 GM/200ML-% IV SOLN
1000.0000 mg | Freq: Once | INTRAVENOUS | Status: AC
  Administered 2015-01-23: 20:00:00 1000 mg via INTRAVENOUS
  Filled 2015-01-23: qty 200

## 2015-01-23 MED ORDER — METRONIDAZOLE 500 MG PO TABS
500.0000 mg | ORAL_TABLET | Freq: Three times a day (TID) | ORAL | Status: DC
  Administered 2015-01-23 – 2015-01-31 (×26): 500 mg via ORAL
  Filled 2015-01-23 (×26): qty 1

## 2015-01-23 MED ORDER — METOPROLOL SUCCINATE ER 25 MG PO TB24
25.0000 mg | ORAL_TABLET | Freq: Every day | ORAL | Status: DC
  Administered 2015-01-23 – 2015-02-04 (×13): 25 mg via ORAL
  Filled 2015-01-23 (×13): qty 1

## 2015-01-23 MED ORDER — GENTAMICIN IN SALINE 1.6-0.9 MG/ML-% IV SOLN: 80.0000 mg | Freq: Once | INTRAVENOUS | Status: DC

## 2015-01-23 MED ORDER — VANCOMYCIN HCL 500 MG IV SOLR
500.0000 mg | Freq: Two times a day (BID) | INTRAVENOUS | Status: DC
  Administered 2015-01-24 – 2015-01-25 (×3): 500 mg via INTRAVENOUS
  Filled 2015-01-23 (×5): qty 500

## 2015-01-23 MED ORDER — HEPARIN SODIUM (PORCINE) 5000 UNIT/ML IJ SOLN
5000.0000 [IU] | Freq: Three times a day (TID) | INTRAMUSCULAR | Status: DC
  Administered 2015-01-23 – 2015-02-04 (×38): 5000 [IU] via SUBCUTANEOUS
  Filled 2015-01-23 (×38): qty 1

## 2015-01-23 MED ORDER — NYSTATIN 100000 UNIT/GM EX POWD
Freq: Two times a day (BID) | CUTANEOUS | Status: DC
  Administered 2015-01-23 – 2015-01-30 (×15): via TOPICAL
  Filled 2015-01-23 (×3): qty 15

## 2015-01-23 MED ORDER — ACETAMINOPHEN 325 MG PO TABS
650.0000 mg | ORAL_TABLET | ORAL | Status: DC | PRN
  Administered 2015-01-23 – 2015-01-26 (×2): 650 mg via ORAL
  Filled 2015-01-23 (×3): qty 2

## 2015-01-23 MED ORDER — ASPIRIN EC 81 MG PO TBEC
81.0000 mg | DELAYED_RELEASE_TABLET | Freq: Every day | ORAL | Status: DC
  Administered 2015-01-23 – 2015-02-04 (×13): 81 mg via ORAL
  Filled 2015-01-23 (×13): qty 1

## 2015-01-23 MED ORDER — MIRTAZAPINE 30 MG PO TABS
30.0000 mg | ORAL_TABLET | Freq: Every day | ORAL | Status: DC
  Administered 2015-01-23 – 2015-02-03 (×13): 30 mg via ORAL
  Filled 2015-01-23 (×13): qty 1

## 2015-01-23 MED ORDER — POTASSIUM CHLORIDE IN NACL 20-0.9 MEQ/L-% IV SOLN
INTRAVENOUS | Status: AC
  Administered 2015-01-23 (×2): via INTRAVENOUS
  Filled 2015-01-23 (×2): qty 1000

## 2015-01-23 MED ORDER — MEMANTINE HCL 10 MG PO TABS
10.0000 mg | ORAL_TABLET | Freq: Two times a day (BID) | ORAL | Status: DC
  Administered 2015-01-23 – 2015-02-04 (×26): 10 mg via ORAL
  Filled 2015-01-23 (×31): qty 1

## 2015-01-23 MED ORDER — QUETIAPINE FUMARATE 25 MG PO TABS: 25.0000 mg | ORAL_TABLET | Freq: Three times a day (TID) | ORAL | Status: DC | PRN

## 2015-01-23 MED ORDER — QUETIAPINE FUMARATE 25 MG PO TABS
50.0000 mg | ORAL_TABLET | Freq: Every day | ORAL | Status: DC
  Administered 2015-01-23 – 2015-02-03 (×13): 50 mg via ORAL
  Filled 2015-01-23 (×13): qty 2

## 2015-01-23 NOTE — Progress Notes (Signed)
Notified Dr. Anne HahnWillis of Gram positive diplococci in both aerobic bottle and anaerobic bottle. Pt get vancand rocephin IV and flagyl PO. Temp 103 oral. Rechecked 102.6. Tylenol given. No further orders.

## 2015-01-23 NOTE — Progress Notes (Signed)
Spoke to Dr. Clent RidgesWalsh regarding positive blood cultures - one bottle anaerobic gram positive diplo cocci. MD to place orders. Bo McclintockBrewer,Theadore Blunck S, RN

## 2015-01-23 NOTE — Progress Notes (Signed)
[facility] [place] at [facility]   PATIENT NAME: Kelli RoysRachel Matthews    MR#:  [id number]  DATE OF BIRTH:  10/03/1940  SUBJECTIVE:  CHIEF COMPLAINT:   Chief Complaint  Patient presents with  . Diarrhea   She is here due to acute diarrhea, nonverbal at baseline. REVIEW OF SYSTEMS:    Review of Systems  Unable to perform ROS due to pt's dementia and nonverbal state.   Nutrition: Await Speech eval but started on clear liquid diet Tolerating Diet: Await Speech eval.    DRUG ALLERGIES:   Allergies  Allergen Reactions  . Augmentin [Amoxicillin-Pot Clavulanate] Rash  . Latex Rash    VITALS:  Blood pressure 130/65, pulse 93, temperature 98.5 F (36.9 C), temperature source Oral, resp. rate 20, height 5\' 5"  (1.651 m), weight 59.194 kg (130 lb 8 oz), SpO2 100 %.  PHYSICAL EXAMINATION:   Physical Exam  GENERAL:  74 y.o.-year-old non-verbal patient lying in the bed with no acute distress.  EYES: Pupils equal, round, reactive to light and accommodation. No scleral icterus. Extraocular muscles intact.  HEENT: Head atraumatic, normocephalic. Oropharynx and nasopharynx clear.  NECK:  Supple, no jugular venous distention. No thyroid enlargement, no tenderness.  LUNGS: Normal breath sounds bilaterally, no wheezing, rales, rhonchi. No use of accessory muscles of respiration.  CARDIOVASCULAR: S1, S2 normal. II/VI SEM at RSB, no rubs, gallops or clicks.   ABDOMEN: Soft, nontender, nondistended. Bowel sounds present. No organomegaly or mass.  EXTREMITIES: No cyanosis, clubbing or edema b/l.    NEUROLOGIC: Cranial nerves II through XII are intact. No focal Motor or sensory deficits b/l.  Globally weak.  PSYCHIATRIC: The patient is alert and oriented x 3.  SKIN: No obvious rash, lesion, or ulcer.    LABORATORY PANEL:   CBC  Recent Labs Lab 01/23/15 0443  WBC 9.9  HGB 8.9*  HCT 27.0*  PLT 282    ------------------------------------------------------------------------------------------------------------------  Chemistries   Recent Labs Lab 01/23/15 0443  NA 139  K 3.0*  CL 113*  CO2 23  GLUCOSE 92  BUN 34*  CREATININE 0.96  CALCIUM 7.5*  AST 290*  ALT 260*  ALKPHOS 90  BILITOT 0.2*   ------------------------------------------------------------------------------------------------------------------  Cardiac Enzymes No results for input(s): TROPONINI in the last 168 hours. ------------------------------------------------------------------------------------------------------------------  RADIOLOGY:  [place] Abdomen Complete  01/23/2015   CLINICAL DATA:  Elevated liver function tests and lipase  EXAM: ULTRASOUND ABDOMEN COMPLETE  COMPARISON:  11/25/2014 abdominal CT  FINDINGS: Gallbladder: No gallstones or wall thickening visualized. No sonographic Murphy sign noted.  Common bile duct: Diameter: 9 mm. Although larger than on 2015 ultrasound, size is similar to 11/25/2014 study. Where visualized, no filling defect. Noted normal bilirubin.  Liver: Lobulated cysts in the right liver, measuring up to 35 mm near the gallbladder fossa. No evidence of solid mass. Antegrade flow in the imaged portal venous system.  IVC: No abnormality visualized.  Pancreas: Visualized portion unremarkable.  Spleen: Size and appearance within normal limits.  Right Kidney: Length: 10 cm. There is a chronic extrarenal pelvis. No hydronephrosis.  Left Kidney: Length: 12 cm. A ureteral stent is present (seen in the bladder) and hydronephrosis is decreased from comparison CT. Large stone present in the lower pole left kidney, 18 mm in maximal diameter.  Abdominal aorta: No aneurysm visualized.  Other findings: None.  IMPRESSION: 1. No acute findings. 2. Mildly dilated common bile duct (9mm), but stable from March 2016 CT. 3. Decreased left hydronephrosis after stenting. Large left lower  pole nephrolithiasis remains.  4. Incidental hepatic cysts.   Electronically Signed   By: Marnee Spring M.D.   On: 01/23/2015 00:47   Dg Chest Portable 1 View  01/22/2015   CLINICAL DATA:  Acute onset of diarrhea.  Initial encounter.  EXAM: PORTABLE CHEST - 1 VIEW  COMPARISON:  Chest radiograph performed 10/09/2013  FINDINGS: The lungs are well-aerated and clear. There is no evidence of focal opacification, pleural effusion or pneumothorax.  The cardiomediastinal silhouette is borderline normal in size. No acute osseous abnormalities are seen.  IMPRESSION: No acute cardiopulmonary process seen.   Electronically Signed   By: Roanna Raider M.D.   On: 01/22/2015 21:12     ASSESSMENT AND PLAN:   74 year old female with past history of dementia, hypertension, chronic kidney disease, history of nephrolithiasis, chronic anemia, osteoarthritis, history of hepatic cysts, who presented to the hospital due to acute diarrhea and noted to be positive for C. Difficile.  Patient was also noted to be hypokalemic.  #1 acute diarrhea-this is likely secondary to C. difficile colitis. -Continue enteric precautions, will switch from IV Flagyl to oral Flagyl. -We'll follow clinically, presents clear liquid diet.    #2 urinary tract infection-this was based off the urinalysis on admission. -Continue IV ceftriaxone, follow urine cultures.  #3 hypokalemia-this is likely secondary to the diarrhea. -Continue potassium supplements, follow serum potassium levels and it's improved since yesterday.  #4 abnormal LFTs-this could possibly be related to the underlying infection and diarrhea. -Abdominal ultrasound does not show any evidence of acute hepatobiliary pathology. -We'll follow LFTs  #5 hypertension-presently hemodynamically stable. Continue metoprolol.  #6 fungal infection-this is located in bilateral groin area. Continue nystatin cream/powder.  #7 dementia-continue Aricept, Seroquel. -Avoid benzodiazepines.  #8 depression-continue  Celexa.  #9 left-sided nephrolithiasis-patient is status post left-sided ureteral stent placement about 3 weeks ago. Patient is followed by Dr. Vanna Scotland. -No acute abdominal pain or evidence of renal colic.   All the records are reviewed and case discussed with Care Management/Social Workerr. Management plans discussed with the patient, family and they are in agreement.  CODE STATUS: DO NOT RESUSCITATE  DVT Prophylaxis: Heparin subcutaneous  TOTAL TIME TAKING CARE OF THIS PATIENT: 30 minutes.   POSSIBLE D/C IN 1-2 DAYS, DEPENDING ON CLINICAL CONDITION.   Houston Siren M.D on 01/23/2015 at 9:04 AM  Between 7am to 6pm - Pager - [phone]  After 6pm go to [email or web address] - password [email or web address] [facility]  [place] [facility]  Office  [phone]  CC: Primary care physician; Wynona Dove, MD

## 2015-01-23 NOTE — Progress Notes (Signed)
ANTIBIOTIC CONSULT NOTE - INITIAL  Pharmacy Consult for Ceftriaxone Indication: Intra-abdominal Infection  Allergies  Allergen Reactions  . Augmentin [Amoxicillin-Pot Clavulanate] Rash  . Latex Rash    Patient Measurements: Height: 5\' 5"  (165.1 cm) Weight: 130 lb 8 oz (59.194 kg) IBW/kg (Calculated) : 57 Adjusted Body Weight: n/a  Vital Signs: Temp: 98.5 F (36.9 C) (05/29 0107) Temp Source: Oral (05/29 0107) BP: 143/86 mmHg (05/29 0107) Pulse Rate: 92 (05/29 0107) Intake/Output from previous day:   Intake/Output from this shift:    Labs:  Recent Labs  01/20/15 1025 01/22/15 2115  WBC 14.0* 11.1*  HGB 9.7* 9.8*  PLT 335 312  CREATININE  --  1.35*   Estimated Creatinine Clearance: 33.4 mL/min (by C-G formula based on Cr of 1.35). No results for input(s): VANCOTROUGH, VANCOPEAK, VANCORANDOM, GENTTROUGH, GENTPEAK, GENTRANDOM, TOBRATROUGH, TOBRAPEAK, TOBRARND, AMIKACINPEAK, AMIKACINTROU, AMIKACIN in the last 72 hours.   Microbiology: Recent Results (from the past 720 hour(s))  C difficile quick scan w PCR reflex [facility])     Status: None   Collection Time: 01/22/15  9:58 PM  Result Value Ref Range Status   C Diff antigen POSITIVE  Final   C Diff toxin NEGATIVE  Final   C Diff interpretation REFLEX TO PCR  Final  Clostridium Difficile by PCR (not at [facility])     Status: Abnormal   Collection Time: 01/22/15  9:58 PM  Result Value Ref Range Status   C difficile by pcr POSITIVE (A) NEGATIVE Final    Comment: CRITICAL RESULT CALLED TO, READ BACK BY AND VERIFIED WITH: LUIS FLORES AT 2336 01/22/15.PMH     Medical History: Past Medical History  Diagnosis Date  . Dementia 2012  . Maxillary sinus fracture     after fall 2013, with LOC  . Hypertension 2001    on medicaitons for at least 10 years  . Other esophagitis 2011  . Personal history of tobacco use, presenting hazards to health 2011  . H/O cystitis 2010  . Arthritis 2005  . Rectal mass 2010  . Alzheimer  disease   . Chronic kidney disease   . Anemia   . Alzheimer disease   . Kidney stone   . Hypokalemia   . Hepatic cyst     Medications:  Anti-infectives    Start     Dose/Rate Route Frequency Ordered Stop   01/23/15 1800  cefTRIAXone (ROCEPHIN) 1 g in dextrose 5 % 50 mL IVPB - Premix     1 g 100 mL/hr over 30 Minutes Intravenous Every 24 hours 01/23/15 0126     01/22/15 2300  cefTRIAXone (ROCEPHIN) 2 g in dextrose 5 % 50 mL IVPB - Premix     2 g 100 mL/hr over 30 Minutes Intravenous  Once 01/22/15 2241 01/23/15 0058   01/22/15 2245  metroNIDAZOLE (FLAGYL) IVPB 500 mg     500 mg 100 mL/hr over 60 Minutes Intravenous Every 8 hours 01/22/15 2241       Assessment: Patient admitted for intra-abdominal infection. C.Diff is positive. Patient initiated on Ceftriaxone and Metronidazole. Patient was also recently treated for UTI.  Goal of Therapy:  resolution of symptoms  Plan:  Follow up culture results Will order Ceftriaxone 1g IV q24h.   Clovia CuffLisa Lanna Labella, PharmD, BCPS 01/23/2015 1:39 AM

## 2015-01-23 NOTE — Progress Notes (Signed)
ANTIBIOTIC CONSULT NOTE - INITIAL  Pharmacy Consult for Vancomycin Indication: Sepsis  Allergies  Allergen Reactions  . Augmentin [Amoxicillin-Pot Clavulanate] Rash  . Latex Rash    Patient Measurements: Height:  (165.1 cm) Weight: 130 lb 8 oz (59.194 kg) IBW/kg (Calculated) : 57   Vital Signs: Temp: 99.4 F (37.4 C) (05/29 1249) Temp Source: Oral (05/29 1249) BP: 139/61 mmHg (05/29 1249) Pulse Rate: 74 (05/29 1249)  Labs:  Recent Labs  01/22/15 2115 01/23/15 0443  WBC 11.1* 9.9  HGB 9.8* 8.9*  PLT 312 282  CREATININE 1.35* 0.96   Estimated Creatinine Clearance: 47 mL/min (by C-G formula based on Cr of 0.96). No results for input(s): VANCOTROUGH, VANCOPEAK, VANCORANDOM, GENTTROUGH, GENTPEAK, GENTRANDOM, TOBRATROUGH, TOBRAPEAK, TOBRARND, AMIKACINPEAK, AMIKACINTROU, AMIKACIN in the last 72 hours.   Microbiology: Recent Results (from the past 720 hour(s))  C difficile quick scan w PCR reflex [facility])     Status: None   Collection Time: 01/22/15  9:58 PM  Result Value Ref Range Status   C Diff antigen POSITIVE  Final   C Diff toxin NEGATIVE  Final   C Diff interpretation REFLEX TO PCR  Final  Clostridium Difficile by PCR (not at [facility])     Status: Abnormal   Collection Time: 01/22/15  9:58 PM  Result Value Ref Range Status   C difficile by pcr POSITIVE (A) NEGATIVE Final    Comment: CRITICAL RESULT CALLED TO, READ BACK BY AND VERIFIED WITH: LUIS FLORES AT 2336 01/22/15.PMH   Culture, blood (routine x 2)     Status: None (Preliminary result)   Collection Time: 01/22/15 10:53 PM  Result Value Ref Range Status   Specimen Description BLOOD  Final   Special Requests Normal  Final   Culture NO GROWTH < 24 HOURS  Final   Report Status PENDING  Incomplete  Culture, blood (routine x 2)     Status: None (Preliminary result)   Collection Time: 01/22/15 10:53 PM  Result Value Ref Range Status   Specimen Description BLOOD  Final   Special Requests Normal  Final   Culture  Setup Time   Final    GRAM POSITIVE DIPLOCOCCI ANAEROBIC BOTTLE ONLY IDENTIFICATION TO FOLLOW CRITICAL RESULT CALLED TO, READ BACK BY AND VERIFIED WITH: CALLED TO SHAE BREWER ON 01/22/18 AT 1820 BY JEF    Culture   Final    GRAM POSITIVE DIPLOCOCCI IDENTIFICATION TO FOLLOW    Report Status PENDING  Incomplete    Medical History: Past Medical History  Diagnosis Date  . Dementia 2012  . Maxillary sinus fracture     after fall 2013, with LOC  . Hypertension 2001    on medicaitons for at least 10 years  . Other esophagitis 2011  . Personal history of tobacco use, presenting hazards to health 2011  . H/O cystitis 2010  . Arthritis 2005  . Rectal mass 2010  . Alzheimer disease   . Chronic kidney disease   . Anemia   . Alzheimer disease   . Kidney stone   . Hypokalemia   . Hepatic cyst     Medications:  Anti-infectives    Start     Dose/Rate Route Frequency Ordered Stop   01/31/15 0000  gentamicin (GARAMYCIN) IVPB 80 mg  Status:  Discontinued     80 mg 100 mL/hr over 30 Minutes Intravenous  Once 01/23/15 0858 01/23/15 1124   01/31/15 0000  gentamicin (GARAMYCIN) IVPB 80 mg  Status:  Discontinued  80 mg 100 mL/hr over 30 Minutes Intravenous  Once 01/23/15 1127 01/23/15 1137   01/31/15 0000  gentamicin (GARAMYCIN) IVPB 80 mg  Status:  Discontinued     80 mg 100 mL/hr over 30 Minutes Intravenous  Once 01/23/15 1222 01/23/15 1246   01/23/15 1930  vancomycin (VANCOCIN) IVPB 1000 mg/200 mL premix     1,000 mg 200 mL/hr over 60 Minutes Intravenous  Once 01/23/15 1917     01/23/15 1800  cefTRIAXone (ROCEPHIN) 1 g in dextrose 5 % 50 mL IVPB - Premix     1 g 100 mL/hr over 30 Minutes Intravenous Every 24 hours 01/23/15 0126     01/23/15 0830  metroNIDAZOLE (FLAGYL) tablet 500 mg     500 mg Oral 3 times per day 01/23/15 0827 02/06/15 0559   01/22/15 2300  cefTRIAXone (ROCEPHIN) 2 g in dextrose 5 % 50 mL IVPB - Premix     2 g 100 mL/hr over 30 Minutes Intravenous   Once 01/22/15 2241 01/23/15 0058   01/22/15 2245  metroNIDAZOLE (FLAGYL) IVPB 500 mg  Status:  Discontinued     500 mg 100 mL/hr over 60 Minutes Intravenous Every 8 hours 01/22/15 2241 01/23/15 0828     Assessment: Patient admitted for intra-abdominal infection.   On Ceftriaxone, Metronidazole currently.  Blood cultures positive for gram + cocci - ID to follow   Goal of Therapy:  Vancomycin 1gm load given Order 500mg  q12hr with trough before 4th dose  Vancomycin trough level 15-20 mcg/ml target  Plan:  Measure antibiotic drug levels at steady state  Jerred Zaremba K 01/23/2015,7:22 PM

## 2015-01-23 NOTE — Progress Notes (Signed)
Notified Dr Betti Cruzeddy that CBC and creatinine done on 01/22/15 2115. Can I d/c CBC and creatinine that are ordered to be drawn at 0100 for baseline heparin? MD verbalized to d/c these orders.

## 2015-01-23 NOTE — Plan of Care (Signed)
Problem: Discharge Progression Outcomes Goal: Discharge plan in place and appropriate Individualization:  Individualization of Care:   [facility]  PMH: Dementia/Alzheimers, HTN, CKD, Anemia, Hypokalemia, and nonverbal at baseline - controlled with medications.  High Fall Risk - toileting assistance with hourly rounding.        Goal: Other Discharge Outcomes/Goals Plan of care progress to goal:  Patient remains afebrile. Enteric precautions in place. No signs/symptoms of pain noted. 0.9% NaCl with KCl 20 mEq/L at 75 ml/hr infusing. K 3.0 - Labs to be drawn in AM. Diet advanced to clear liquids. PO Flagyl ordered. No BM this shift - stool culture needed. High Fall Risk - bed alarm on, room near nurses station.

## 2015-01-23 NOTE — H&P (Signed)
[facility] [place] [facility]   PATIENT NAME: Kelli RoysRachel Matthews    MR#:  [id number]  DATE OF BIRTH:  03/19/1941  DATE OF ADMISSION:  01/22/2015  PRIMARY CARE PHYSICIAN: Wynona DoveWALKER,JENNIFER AZBELL, MD   REQUESTING/REFERRING PHYSICIAN: Alfonse FlavorsPhilip Stafford  CHIEF COMPLAINT:   Chief Complaint  Patient presents with  . Diarrhea    HISTORY OF PRESENT ILLNESS:  Kelli Matthews  is a 74 y.o. female with a known history of [name], resident of memory unit assisted living facility brought in with the complaints of diarrhea 4 today. According to the patient's daughter who is with the patient at this time nursing staff reported 4 episodes of loose stools which is not bloody. Patient has chronic dementia and is basically noncommunicative and on total care dependent. Per patient's daughter she was having some fevers for the past 2-3 days and this morning when she saw her mother, her mother was in her usual state of health without any fever. Later on in the evening nursing staff notified of patient's diarrhea. There is also a question of transient period of altered sensorium. EMS was called, site and the patient was brought to the emergency room for further evaluation. Patient on arrival to the ED was found to be alert and awake but noncommunicative because of baseline dementia with stable vitals. Workup revealed severe hypokalemia with production of 2.1, markedly elevated LFTs, elevated lipase of 164. After obtaining blood and urine cultures, patient was started on IV ceftriaxone and Flagyl by the ED physician. Abdominal ultrasound was ordered which is pending at this time. Urinalysis shows some trace bacteria with a WBC of 6-30. Of note patient had a urological procedure with stent placement about 2 weeks ago for left ureteric stone and was also treated for a UTI with by mouth Bactrim. Patient had a transient episode of hypotension in the ED for which she received normal saline  bolus 2 and blood pressure is currently maintaining in the normal range. Due to the patient's daughter patient is at her baseline mental status at the current time. At this is seen comfortably resting in the bed, appears in no distress, noncommunicative because of dementia.  PAST MEDICAL HISTORY:   Past Medical History  Diagnosis Date  . Dementia 2012  . Maxillary sinus fracture     after fall 2013, with LOC  . Hypertension 2001    on medicaitons for at least 10 years  . Other esophagitis 2011  . Personal history of tobacco use, presenting hazards to health 2011  . H/O cystitis 2010  . Arthritis 2005  . Rectal mass 2010  . Alzheimer disease   . Chronic kidney disease   . Anemia   . Alzheimer disease   . Kidney stone   . Hypokalemia   . Hepatic cyst     PAST SURGICAL HISTORY:   Past Surgical History  Procedure Laterality Date  . Breast reduction surgery  2003  . Breast lumpectomy  1985    Benign per pt, right breast  . Colonoscopy  2004, 2010,2011  . Breast reduction surgery  2003or 2004    by Dr. Genevieve NorlanderAu  . Colon surgery  2010    polyps, Dr. Lemar LivingsByrnett, hemicolectomy  . Vein ligation and stripping  2005  . Wrist surgery  2005  . Upper gi endoscopy  2011  . Varicose veins  2011    laser surgery  . Cystoscopy/ureteroscopy/holmium laser/stent placement Left 01/04/2015    Procedure: CYSTOSCOPY/LEFTURETEROSCOPY/HOLMIUM LASER/LEFT STENT PLACEMENT/LEFT RETROGRADE;  Surgeon: Vanna Scotland, MD;  Location: [facility] ORS;  Service: Urology;  Laterality: Left;    SOCIAL HISTORY:   History  Substance Use Topics  . Smoking status: Former Smoker -- 17 years    Types: Cigarettes    Quit date: 08/28/1995  . Smokeless tobacco: Never Used     Comment: quit in 1977  . Alcohol Use: No    FAMILY HISTORY:   Family History  Problem Relation Age of Onset  . Alzheimer's disease Mother   . Stroke Mother   . Ovarian cancer Sister   . Early death Sister 93  . Depression Son     DRUG  ALLERGIES:   Allergies  Allergen Reactions  . Augmentin [Amoxicillin-Pot Clavulanate] Rash  . Latex Rash    REVIEW OF SYSTEMS:   Review of Systems  Unable to perform ROS: dementia    MEDICATIONS AT HOME:   Prior to Admission medications   Medication Sig Start Date End Date Taking? Authorizing Provider  acetaminophen (TYLENOL) 325 MG tablet Take 650 mg by mouth every 4 (four) hours as needed for mild pain or fever.    Historical Provider, MD  aspirin EC 81 MG tablet Take 81 mg by mouth daily.    Historical Provider, MD  cholecalciferol (VITAMIN D) 1000 UNITS tablet TAKE ONE TABLET BY MOUTH EACH DAY. 05/13/14   Shelia Media, MD  citalopram (CELEXA) 20 MG tablet Take 20 mg by mouth daily.  05/11/13   Historical Provider, MD  colestipol (COLESTID) 1 G tablet Take 1 g by mouth 2 (two) times daily as needed.     Historical Provider, MD  desonide (DESOWEN) 0.05 % cream Apply 1 application topically as needed (to face).  08/01/12   Historical Provider, MD  donepezil (ARICEPT) 10 MG tablet TAKE ONE TABLET BY MOUTH EACH DAY AT BEDTIME. 07/06/13   Shelia Media, MD  fluticasone [facility]) 50 MCG/ACT nasal spray Two sprays in each nostril daily. 09/09/12   Raquel Conni Elliot, NP  gentamicin cream (GARAMYCIN) 0.1 % APPLY TWICE DAILY TO SKIN LESIONS ON UPPER BACK Patient taking differently: APPLY TWICE DAILY TO SKIN LESIONS ON UPPER BACK AS NEEDED    Shelia Media, MD  HYDROcodone-acetaminophen (NORCO/VICODIN) 5-325 MG per tablet Take 1-2 tablets by mouth every 6 (six) hours as needed. 01/04/15   Vanna Scotland, MD  Infant [employer] [facility]) CREA Apply 1 application topically as needed (apply to bottom).     Historical Provider, MD  lisinopril-hydrochlorothiazide (PRINZIDE,ZESTORETIC) 10-12.5 MG per tablet Take 1 tablet by mouth daily.    Historical Provider, MD  loperamide (IMODIUM A-D) 2 MG tablet TAKE 1 TAB PO AFTER EACH LOOSE STOOL. (MAX 3/12HR) IF PROBLEM PERSISTS AFTER 12 HOURS, CALL  MD. 04/26/14   Shelia Media, MD  memantine (NAMENDA) 10 MG tablet Take 10 mg by mouth 2 (two) times daily.    Historical Provider, MD  metoprolol succinate (TOPROL-XL) 25 MG 24 hr tablet Take 1 tablet (25 mg total) by mouth daily. 03/25/12   Shelia Media, MD  mirtazapine (REMERON) 15 MG tablet Take 30 mg by mouth at bedtime.  12/10/12   Historical Provider, MD  Multiple Vitamins-Minerals (THERAVIM-M) TABS TAKE ONE TABLET BY MOUTH EACH DAY. 05/28/13   Shelia Media, MD  psyllium (METAMUCIL) 58.6 % powder Take 1 packet by mouth daily. 15 mL daily    Historical Provider, MD  QUEtiapine (SEROQUEL) 25 MG tablet Take 25 mg by mouth 3 (three) times daily as needed (  agitation).    Historical Provider, MD  QUEtiapine (SEROQUEL) 50 MG tablet Take 50 mg by mouth at bedtime.    Historical Provider, MD  triamcinolone cream (KENALOG) 0.1 % Apply 1 application topically as needed (apply to upper back and chest as needed for redness).  08/01/12   Historical Provider, MD      VITAL SIGNS:  Blood pressure 126/50, pulse 84, temperature 98.3 F (36.8 C), temperature source Oral, resp. rate 12, height 5\' 5"  (1.651 m), weight 70.308 kg (155 lb), SpO2 99 %.  PHYSICAL EXAMINATION:  Physical Exam  Constitutional: She appears well-developed and well-nourished. No distress.  HENT:  Head: Normocephalic and atraumatic.  Right Ear: External ear normal.  Left Ear: External ear normal.  Nose: Nose normal.  Mouth/Throat: Oropharynx is clear and moist. No oropharyngeal exudate.  Eyes: EOM are normal. Pupils are equal, round, and reactive to light. No scleral icterus.  Neck: Normal range of motion. Neck supple. No JVD present. No thyromegaly present.  Cardiovascular: Normal rate, regular rhythm, normal heart sounds and intact distal pulses.  Exam reveals no friction rub.   No murmur heard. Respiratory: Effort normal and breath sounds normal. No respiratory distress. She has no wheezes. She has no rales. She  exhibits no tenderness.  GI: Soft. Bowel sounds are normal. She exhibits no distension and no mass. There is no tenderness. There is no rebound and no guarding.  Musculoskeletal: Normal range of motion. She exhibits no edema.  Lymphadenopathy:    She has no cervical adenopathy.  Neurological: She is alert. She displays normal reflexes. No cranial nerve deficit. She exhibits normal muscle tone.  Moving all 4 limbs well.  Skin: Skin is warm. No rash noted. No erythema.   LABORATORY PANEL:   CBC  Recent Labs Lab 01/22/15 2115  WBC 11.1*  HGB 9.8*  HCT 29.9*  PLT 312   ------------------------------------------------------------------------------------------------------------------  Chemistries   Recent Labs Lab 01/22/15 2115  NA 139  K 2.1*  CL 105  CO2 23  GLUCOSE 169*  BUN 49*  CREATININE 1.35*  CALCIUM 8.1*  AST 623*  ALT 399*  ALKPHOS 121  BILITOT 0.3   ------------------------------------------------------------------------------------------------------------------  Cardiac Enzymes No results for input(s): TROPONINI in the last 168 hours. ------------------------------------------------------------------------------------------------------------------  RADIOLOGY:  Dg Chest Portable 1 View  01/22/2015   CLINICAL DATA:  Acute onset of diarrhea.  Initial encounter.  EXAM: PORTABLE CHEST - 1 VIEW  COMPARISON:  Chest radiograph performed 10/09/2013  FINDINGS: The lungs are well-aerated and clear. There is no evidence of focal opacification, pleural effusion or pneumothorax.  The cardiomediastinal silhouette is borderline normal in size. No acute osseous abnormalities are seen.  IMPRESSION: No acute cardiopulmonary process seen.   Electronically Signed   By: Roanna Raider M.D.   On: 01/22/2015 21:12    EKG:   Orders placed or performed during the hospital encounter of 01/22/15  . ED EKG normal sinus rhythm with ventricular rate of 79 bpm.   . ED EKG     IMPRESSION AND PLAN:   1. C. difficile diarrhea.  2. Severe hypokalemia secondary to diarrhea. 3. Elevated LFTs with elevated lipase, rule out cholelithiasis, rule out infection. History of prior pancreatitis per old records. 4. [name], chronic-stable on home medications. 5. Hypertension, stable. 6. Chronic anemia. 7. Nephrolithiasis status post recent stent placement, recent treatment for UTI.  Plan: Admit, isolation with enteric precautions, continue IV Flagyl, IV ceftriaxone, nothing by mouth, IV fluids, potassium supplementation, follow-up results of abdominal sonogram,  follow blood cultures, CBC, CMP.    All the records are reviewed and case discussed with ED provider. Management plans discussed with the patient, family and they are in agreement.  CODE STATUS: DO NOT RESUSCITATE  TOTAL TIME TAKING CARE OF THIS PATIENT: 50 minutes.    Jonnie Kind N M.D on 01/23/2015 at 12:03 AM  Between 7am to 6pm - Pager - [phone]  After 6pm go to [email or web address] - password [email or web address] [facility]  [address] [place] [facility]  Office  [phone]  CC: Primary care physician; Wynona Dove, MD

## 2015-01-23 NOTE — Plan of Care (Addendum)
Problem: Discharge Progression Outcomes Goal: Discharge plan in place and appropriate Individualization: [facility]. H/o [name], non-communicative, total care.  Meds crushed with H2O. H/O Alzheimer's demenia, HTN, hypokalemia- controlled with meds.     Goal: Other Discharge Outcomes/Goals Plan of care progress to goals: Afebrile. BP/HR stable. C-diff positive, enteric precautions initiated prior to admission. No signs of pain nor discomfort. Potassium and IVF infusing. CBC/CMP pending in am. NPO except meds with sips. No BM since admission.

## 2015-01-24 LAB — COMPREHENSIVE METABOLIC PANEL
ALBUMIN: 2.4 g/dL — AB (ref 3.5–5.0)
ALT: 205 U/L — AB (ref 14–54)
ANION GAP: 6 (ref 5–15)
AST: 150 U/L — ABNORMAL HIGH (ref 15–41)
Alkaline Phosphatase: 118 U/L (ref 38–126)
BUN: 17 mg/dL (ref 6–20)
CO2: 26 mmol/L (ref 22–32)
Calcium: 7.8 mg/dL — ABNORMAL LOW (ref 8.9–10.3)
Chloride: 111 mmol/L (ref 101–111)
Creatinine, Ser: 1.03 mg/dL — ABNORMAL HIGH (ref 0.44–1.00)
GFR calc Af Amer: >60 mL/min (ref >60)
GFR calc non Af Amer: 53 mL/min — ABNORMAL LOW (ref >60)
Glucose, Bld: 115 mg/dL — ABNORMAL HIGH (ref 65–99)
POTASSIUM: 2.8 mmol/L — AB (ref 3.5–5.1)
Sodium: 143 mmol/L (ref 135–145)
Total Bilirubin: 0.3 mg/dL (ref 0.3–1.2)
Total Protein: 5.7 g/dL — ABNORMAL LOW (ref 6.5–8.1)

## 2015-01-24 LAB — CBC
HCT: 29.7 % — ABNORMAL LOW (ref 35.0–47.0)
HEMOGLOBIN: 9.6 g/dL — AB (ref 12.0–16.0)
MCH: 27.5 pg (ref 26.0–34.0)
MCHC: 32.4 g/dL (ref 32.0–36.0)
MCV: 84.8 fL (ref 80.0–100.0)
Platelets: 322 10*3/uL (ref 150–440)
RBC: 3.5 MIL/uL — AB (ref 3.80–5.20)
RDW: 14.7 % — AB (ref 11.5–14.5)
WBC: 8.9 10*3/uL (ref 3.6–11.0)

## 2015-01-24 LAB — MAGNESIUM: MAGNESIUM: 1.5 mg/dL — AB (ref 1.7–2.4)

## 2015-01-24 MED ORDER — POTASSIUM CHLORIDE 20 MEQ PO PACK
40.0000 meq | PACK | Freq: Once | ORAL | Status: AC
  Administered 2015-01-24: 06:00:00 40 meq via ORAL
  Filled 2015-01-24: qty 2

## 2015-01-24 MED ORDER — POTASSIUM CHLORIDE IN NACL 20-0.9 MEQ/L-% IV SOLN
INTRAVENOUS | Status: DC
  Administered 2015-01-24 (×2): via INTRAVENOUS
  Filled 2015-01-24 (×2): qty 1000

## 2015-01-24 MED ORDER — POTASSIUM CHLORIDE CRYS ER 20 MEQ PO TBCR
20.0000 meq | EXTENDED_RELEASE_TABLET | Freq: Two times a day (BID) | ORAL | Status: DC
  Administered 2015-01-24 – 2015-02-04 (×23): 20 meq via ORAL
  Filled 2015-01-24 (×23): qty 1

## 2015-01-24 MED ORDER — CETYLPYRIDINIUM CHLORIDE 0.05 % MT LIQD
7.0000 mL | Freq: Two times a day (BID) | OROMUCOSAL | Status: DC
  Administered 2015-01-25 – 2015-02-04 (×19): 7 mL via OROMUCOSAL

## 2015-01-24 NOTE — Evaluation (Signed)
Physical Therapy Evaluation Patient Details Name: Kelli FlemingRachel E Schimek MRN: [id number] DOB: 06/19/1941 Today's Date: 01/24/2015   History of Present Illness  Kelli RoysRachel Matthews is a 74 y.o. female with a known history of [name], resident of memory unit assisted living facility brought in with the complaints of diarrhea 4 today. She was admitted with c-diff, severe hypokalemia; She also recently was treated for UTI a few weeks ago. Patient is non-verbal at baseline and has difficulty following commands.  Clinical Impression  74 yo Female with advanced alzheimer's and dementia; patient is non-verbal. She was brought to ED from ALF with diarrhea and was diagnosed with c-diff. She was able to participate with PT evaluation with excessive cues including verbal and tactile. Patient unable to follow simple commands consistently. She requires prompting and tactile cues to initiate movement. She was min A for bed mobility but this was to initiate movement and then once she started scooting was able to sit edge of bed independently; Patient tranfers with CGA requiring excessive cues to initiate transfer but then is able to sit down in chair independently. She was slow to initiate gait but this could be related to confusion in new environment as well as weakness from being bed bound. [facility] and therefore would be safe [facility] be [facility]. Patient does not demonstrate ability to learn new task or follow commands and therefore would not be a good candidate for skilled PT intervention. No additonal PT needed at this time.    Follow Up Recommendations No PT follow up    Equipment Recommendations       Recommendations for Other Services       Precautions / Restrictions Precautions Precautions: Fall Restrictions Weight Bearing Restrictions: No      Mobility  Bed Mobility Overal bed mobility: Needs Assistance Bed Mobility: Supine to  Sit;Sit to Supine     Supine to sit: Min assist Sit to supine: Independent   General bed mobility comments: needs assistance to initiate supine to sit as patient unable to follow verbal commands; once prompted she is able to scoot edge of bed, supervision;  Transfers Overall transfer level: Needs assistance Equipment used: None Transfers: Sit to/from Stand Sit to Stand: Min guard         General transfer comment: able to transfer sit<>Stand from bed, CGA with excessive tactile and verbal cues to follow task; patient able to sit down independently; she appears to needs excessive tactile cues to initiate transfer but once standing able to stand independently.  Ambulation/Gait Ambulation/Gait assistance: Min guard Ambulation Distance (Feet): 20 Feet Assistive device: None Gait Pattern/deviations: Step-through pattern;Decreased stride length;Narrow base of support   Gait velocity interpretation: <1.8 ft/sec, indicative of risk for recurrent falls General Gait Details: Ambulates in room, CGA with tactile cues for direction of where to walk;very slow to initiate gait tasks but once cued, patient able to take reciprocal steps without AD; appears to be at baseline; Pt could be walking at slower pace as she is in a new environment and unsure of directions;  Stairs            Wheelchair Mobility    Modified Rankin (Stroke Patients Only)       Balance Overall balance assessment: Independent (able to stand without HHA, unsupported, independently;)  Pertinent Vitals/Pain Pain Assessment:  (pt unable to verbalize; no grimaces)    Home Living Family/patient expects to be discharged to:: Assisted living                 Additional Comments: According to her son, she was able to walk around ALF without AD    Prior Function Level of Independence: Independent         Comments: would walk around room,  independently     Hand Dominance        Extremity/Trunk Assessment               Lower Extremity Assessment: Overall WFL for tasks assessed (unable to MMT; pt patient able to stand independently)      Cervical / Trunk Assessment: Normal  Communication   Communication: Expressive difficulties (non-verbal at baseline)  Cognition Arousal/Alertness: Awake/alert Behavior During Therapy: Flat affect Overall Cognitive Status: History of cognitive impairments - at baseline (non-verbal; unable to follow some verbal commands; responds to tactile cues)       Memory: Decreased recall of precautions;Decreased short-term memory              General Comments General comments (skin integrity, edema, etc.): good skin integrity; unable to respond questions regarding numbness/tingling;    Exercises        Assessment/Plan    PT Assessment Patent does not need any further PT services  PT Diagnosis Difficulty walking   PT Problem List    PT Treatment Interventions     PT Goals (Current goals can be found in the Care Plan section) Acute Rehab PT Goals Patient Stated Goal: unable to verbalize; son wants patient back at ALF PT Goal Formulation: With family Time For Goal Achievement: 01/24/15 Potential to Achieve Goals: Good    Frequency     Barriers to discharge        Co-evaluation               End of Session Equipment Utilized During Treatment: Gait belt Activity Tolerance: Patient tolerated treatment well Patient left: in chair;with call bell/phone within reach;with chair alarm set Nurse Communication: Mobility status         Time: 1115-1140 PT Time Calculation (min) (ACUTE ONLY): 25 min   Charges:   PT Evaluation $Initial PT Evaluation Tier I: 1 Procedure     PT G Codes:        Hopkins,Chantz Montefusco, PT,DPT 01/24/2015, 11:51 AM

## 2015-01-24 NOTE — Plan of Care (Signed)
Problem: SLP Dysphagia Goals Goal: Misc Dysphagia Goal Pt will safely tolerate po diet of least restrictive consistency w/ no overt s/s of aspiration noted by Staff/pt/family x3 sessions.    

## 2015-01-24 NOTE — Progress Notes (Signed)
Spoke to Dr Betti Cruzeddy regarding critical potassium 2.8. MD verbalized to order 40mEq potassium liquid form PO once.

## 2015-01-24 NOTE — Progress Notes (Addendum)
Notified Dr Anne HahnWillis that IVF NS0.9%K2920mEq 4975ml/hr order expired. Pt is on clear liquid diet. Last potassium result 3.0. Do you want to reorder IVF? MD verbalized that he will place an order for IVF.

## 2015-01-24 NOTE — Care Management (Signed)
[facility] with the diagnosis [facility] C-Diff positive. [facility]. Daughter, Lanelle BalLinda Perdue,  [place] Vancomysin and Rocephin continues.  Potassium 2.3 this morning. Potassium Bolus. Gwenette GreetBrenda s Holland RN MSN Care Management [phone]

## 2015-01-24 NOTE — Plan of Care (Signed)
Problem: Discharge Progression Outcomes Goal: Discharge plan in place and appropriate Individualization:  Individualization of Care:   [facility]   PMH: Dementia/Alzheimers, HTN, CKD, Anemia, Hypokalemia, and nonverbal at baseline - controlled with medications.   High Fall Risk - toileting assistance with hourly rounding.     Goal: Other Discharge Outcomes/Goals Plan of care progress to goal:  Patient afebrile this shift. Enteric precautions remain in place. No signs/symptoms of pain noted. 0.9% NaCl with KCl 20 mEq/L at 75 ml/hr infusing. K 2.8 this AM - replacement given; labs to be re-drawn in AM. Diet advanced to DYS 1. High Fall Risk - bed alarm on, room near nurses station. PT consulted patient, up to chair.

## 2015-01-24 NOTE — Evaluation (Signed)
Clinical/Bedside Swallow Evaluation Patient Details  Name: Kelli Matthews MRN: [id number] Date of Birth: 04/11/1941  Today's Date: 01/24/2015 Time: SLP Start Time (ACUTE ONLY): 0915 SLP Stop Time (ACUTE ONLY): 1015 SLP Time Calculation (min) (ACUTE ONLY): 60 min  Past Medical History:  Past Medical History  Diagnosis Date  . Dementia 2012  . Maxillary sinus fracture     after fall 2013, with LOC  . Hypertension 2001    on medicaitons for at least 10 years  . Other esophagitis 2011  . Personal history of tobacco use, presenting hazards to health 2011  . H/O cystitis 2010  . Arthritis 2005  . Rectal mass 2010  . Alzheimer disease   . Chronic kidney disease   . Anemia   . Alzheimer disease   . Kidney stone   . Hypokalemia   . Hepatic cyst    Past Surgical History:  Past Surgical History  Procedure Laterality Date  . Breast reduction surgery  2003  . Breast lumpectomy  1985    Benign per pt, right breast  . Colonoscopy  2004, 2010,2011  . Breast reduction surgery  2003or 2004    by Dr. Genevieve NorlanderAu  . Colon surgery  2010    polyps, Dr. Lemar LivingsByrnett, hemicolectomy  . Vein ligation and stripping  2005  . Wrist surgery  2005  . Upper gi endoscopy  2011  . Varicose veins  2011    laser surgery  . Cystoscopy/ureteroscopy/holmium laser/stent placement Left 01/04/2015    Procedure: CYSTOSCOPY/LEFTURETEROSCOPY/HOLMIUM LASER/LEFT STENT PLACEMENT/LEFT RETROGRADE;  Surgeon: Vanna ScotlandAshley Brandon, MD;  Location: [facility] ORS;  Service: Urology;  Laterality: Left;   HPI:  Son present and denied any previous swallowing deficits ("no coughing or choking when she ate her meals"). Son reported pt has not been drinking "much" recently; "you've got to get her to drink". Pt is nonverbal at baseline.    Assessment / Plan / Recommendation Clinical Impression  Pt appears to present w/ an adequate oropharyngeal phase swallow function for oral intake of purees and liquids. Pt demo. no overt s/s of aspiration of these  consistencies fed to her; no decline in respiratory status noted during/post oral intake. Pt is nonverbal at baseline. During the oral phase, pt exhibited mild oral phase deficits c/b decreased bolus awareness for acceptance. Tactile cues of spoon to lips stimulated bolus acceptance and intake. Pt cleared orally b/t trials given min. time. Pt requires feeding. Pt appears at min. increased risk for aspiration. MD consulted and agreed w/ diet upgrade to puree w/ thin liquids w/ aspiration precautions; meds in puree. NSG and family updated.     Aspiration Risk  Mild (sec. to baseline Dementia/cognitive decline)    Diet Recommendation Dysphagia 1 (Puree);Thin   Medication Administration: Crushed with puree Compensations: Slow rate;Small sips/bites    Other  Recommendations Recommended Consults:  (Dietician) Oral Care Recommendations: Oral care BID;Oral care before and after PO;Staff/trained caregiver to provide oral care   Follow Up Recommendations       Frequency and Duration min 3x week  1 week   Pertinent Vitals/Pain None indicated by Son    SLP Swallow Goals     Swallow Study Prior Functional Status   [facility]    General Date of Onset: 01/22/15 Other Pertinent Information: Son present and denied any previous swallowing deficits ("no coughing or choking when she ate her meals"). Son reported pt has not been drinking "much" recently; "you've got to get her to  drink". Pt is nonverbal at baseline.  Type of Study: Bedside swallow evaluation Previous Swallow Assessment: none indicated Diet Prior to this Study: Regular;Thin liquids Temperature Spikes Noted: Yes Respiratory Status: Room air History of Recent Intubation: No Behavior/Cognition: Cooperative;Confused;Requires cueing;Doesn't follow directions (Dementia) Oral Cavity - Dentition: Adequate natural dentition/normal for age (molar extracted recently per Son) Self-Feeding Abilities: Total  assist Patient Positioning: Upright in bed Baseline Vocal Quality:  (pt is nonverbal at baseline) Volitional Cough: Cognitively unable to elicit Volitional Swallow: Unable to elicit    Oral/Motor/Sensory Function Overall Oral Motor/Sensory Function: Other (comment) (pt is unable to follow any commands)   Ice Chips Ice chips: Impaired Presentation: Spoon (fed to her by SLP;  5 trials) Oral Phase Impairments: Poor awareness of bolus (tactile cues of spoon to lips stimulated bolus acceptance) Pharyngeal Phase Impairments:  (none noted)   Thin Liquid Thin Liquid: Impaired Presentation: Spoon;Straw (fed to her by SLP; 5 trials each ) Oral Phase Impairments: Poor awareness of bolus (tactile cues of spoon to lips stimulated bolus acceptance) Pharyngeal  Phase Impairments:  (none noted)    Nectar Thick Nectar Thick Liquid: Not tested   Honey Thick Honey Thick Liquid: Not tested   Puree Puree: Impaired Presentation: Spoon (fed to her by SLP: 10 trials) Oral Phase Impairments: Poor awareness of bolus (tactile cues of spoon to lips stimulated bolus acceptance)   Solid   GO    Solid: Not tested       Watson,Katherine 01/24/2015,10:32 AM

## 2015-01-24 NOTE — Progress Notes (Signed)
Patient back to bed with 2 person assist, requiring multiple cues during transfer. Bo McclintockBrewer,Analeise Mccleery S, RN

## 2015-01-24 NOTE — Clinical Social Work Note (Signed)
Clinical Social Work Assessment  Patient Details  Name: Kelli Matthews MRN: [id number] Date of Birth: 12-23-1940  Date of referral:  01/24/15               Reason for consult:  Facility Placement                Permission sought to share information with:  Family Supports Permission granted to share information::  No  Name::      (Pt's son:  Mr. Langston Masker)   Housing/Transportation Living arrangements for the past 2 months:  [place] of Information:  Adult Children Patient Interpreter Needed:  None Criminal Activity/Legal Involvement Pertinent to Current Situation/Hospitalization:  No - Comment as needed Significant Relationships:  Adult Children Lives with:  Facility Resident Do you feel safe going back to the place where you live?  Yes Need for family participation in patient care:  Yes (Comment)  Care giving concerns:  Pt is an ALF resident, no care giving needs identified.    Social Worker assessment / plan:  CSW met with pt's son at bedside to address consult. Pt is a resident of [facility] [place] [facility] and has been there since September of 2014. Pt is non-verbal and did not participate in as she has advanced dementia.   Pt's son is agreeable to pt' returning when [facility] at discharge. Pt's son will provide transportation.   CSW spoke with Douglass Rivers, pt was mobile and does not need assistance for ambulation. Pt has a private room at [place]. Facility will complete an assessment prior to pt's return.   Employment status:  Retired Nurse, adult PT Recommendations:  Not assessed at this time Information / Referral to community resources:  Other (Comment Required) (as needed)  Patient/Family's Response to care:  Pt's son is agreeable to discharge plan and pt's son will likely provide transportation to at discharge.   Patient/Family's Understanding of and Emotional Response to Diagnosis, Current Treatment, and Prognosis:  Pt's  son understands that pt will need to return to facility.   Emotional Assessment Appearance:  Appears stated age Attitude/Demeanor/Rapport:  Unable to Assess Affect (typically observed):    Orientation:  Fluctuating Orientation (Suspected and/or reported Sundowners) Alcohol / Substance use:  Never Used Psych involvement (Current and /or in the community):  No (Comment)  Discharge Needs  Concerns to be addressed:  No discharge needs identified Readmission within the last 30 days:  No Current discharge risk:  None Barriers to Discharge:  No Barriers Identified   Darden Dates, LCSW 01/24/2015, 10:06 AM

## 2015-01-24 NOTE — Clinical Social Work Note (Signed)
Clinical Social Worker received consult for pt as she was [facility]. Per RN, pt is positive for CDiff and pt is non-verbal. CSW will follow up with facility. CSW will complete FL2 and place on chart for MD's signature. FL2 will be updated at discharge. Full assessment to follow.   Dede QuerySarah Leon Goodnow, MSW, LCSW Clinical Social Worker [phone]

## 2015-01-24 NOTE — Progress Notes (Signed)
[facility] - [place] at [facility]   PATIENT NAME: Kelli Matthews    MR#:  [id number]  Matthews OF BIRTH:  [birth year]  SUBJECTIVE:  CHIEF COMPLAINT:   Chief Complaint  Patient presents with  . Diarrhea   She is here due to acute diarrhea, nonverbal at baseline. Diarrhea improved. Son at bedside.  REVIEW OF SYSTEMS:    Review of Systems  Unable to perform ROS due to pt's dementia and nonverbal state.   Nutrition: Seen by speech and will start Pureed diet.     DRUG ALLERGIES:   Allergies  Allergen Reactions  . Augmentin [Amoxicillin-Pot Clavulanate] Rash  . Latex Rash    VITALS:  Blood pressure 146/68, pulse 76, temperature 97.5 F (36.4 C), temperature source Oral, resp. rate 18, height  (1.651 m), weight 61.236 kg (135 lb), SpO2 100 %.  PHYSICAL EXAMINATION:   Physical Exam  GENERAL:  74 y.o.-year-old non-verbal patient lying in the bed with no acute distress.  EYES: Pupils equal, round, reactive to light and accommodation. No scleral icterus. Extraocular muscles intact.  HEENT: Head atraumatic, normocephalic. Oropharynx and nasopharynx clear.  NECK:  Supple, no jugular venous distention. No thyroid enlargement, no tenderness.  LUNGS: Normal breath sounds bilaterally, no wheezing, rales, rhonchi. No use of accessory muscles of respiration.  CARDIOVASCULAR: S1, S2 normal. II/VI SEM at RSB, no rubs, gallops or clicks.   ABDOMEN: Soft, nontender, nondistended. Bowel sounds present. No organomegaly or mass.  EXTREMITIES: No cyanosis, clubbing or edema b/l.    NEUROLOGIC: Cranial nerves II through XII are intact. No focal Motor or sensory deficits b/l.  Globally weak.  PSYCHIATRIC: The patient is alert and oriented x 1. Flat affect.  Non-verbal at baseline.  SKIN: No obvious rash, lesion, or ulcer.    LABORATORY PANEL:   CBC  Recent Labs Lab 01/24/15 0444  WBC 8.9  HGB 9.6*  HCT 29.7*  PLT 322    ------------------------------------------------------------------------------------------------------------------  Chemistries   Recent Labs Lab 01/24/15 0444  NA 143  K 2.8*  CL 111  CO2 26  GLUCOSE 115*  BUN 17  CREATININE 1.03*  CALCIUM 7.8*  AST 150*  ALT 205*  ALKPHOS 118  BILITOT 0.3   ------------------------------------------------------------------------------------------------------------------  Cardiac Enzymes No results for input(s): TROPONINI in the last 168 hours. ------------------------------------------------------------------------------------------------------------------  RADIOLOGY:  US Abdomen Complete  01/23/2015   CLINICAL DATA:  Elevated liver function tests and lipase  EXAM: ULTRASOUND ABDOMEN COMPLETE  COMPARISON:  11/25/2014 abdominal CT  FINDINGS: Gallbladder: No gallstones or wall thickening visualized. No sonographic Murphy sign noted.  Common bile duct: Diameter: 9 mm. Although larger than on 2015 ultrasound, size is similar to 11/25/2014 study. Where visualized, no filling defect. Noted normal bilirubin.  Liver: Lobulated cysts in the right liver, measuring up to 35 mm near the gallbladder fossa. No evidence of solid mass. Antegrade flow in the imaged portal venous system.  IVC: No abnormality visualized.  Pancreas: Visualized portion unremarkable.  Spleen: Size and appearance within normal limits.  Right Kidney: Length: 10 cm. There is a chronic extrarenal pelvis. No hydronephrosis.  Left Kidney: Length: 12 cm. A ureteral stent is present (seen in the bladder) and hydronephrosis is decreased from comparison CT. Large stone present in the lower pole left kidney, 18 mm in maximal diameter.  Abdominal aorta: No aneurysm visualized.  Other findings: None.  IMPRESSION: 1. No acute findings. 2. Mildly dilated common bile duct (9mm), but stable from March 2016 CT. 3. Decreased left hydronephrosis  after stenting. Large left lower pole nephrolithiasis remains.  4. Incidental hepatic cysts.   Electronically Signed   By: Marnee SpringJonathon  Watts M.D.   On: 01/23/2015 00:47   Dg Chest Portable 1 View  01/22/2015   CLINICAL DATA:  Acute onset of diarrhea.  Initial encounter.  EXAM: PORTABLE CHEST - 1 VIEW  COMPARISON:  Chest radiograph performed 10/09/2013  FINDINGS: The lungs are well-aerated and clear. There is no evidence of focal opacification, pleural effusion or pneumothorax.  The cardiomediastinal silhouette is borderline normal in size. No acute osseous abnormalities are seen.  IMPRESSION: No acute cardiopulmonary process seen.   Electronically Signed   By: Roanna RaiderJeffery  Chang M.D.   On: 01/22/2015 21:12     ASSESSMENT AND PLAN:   74 year old female with past history of dementia, hypertension, chronic kidney disease, history of nephrolithiasis, chronic anemia, osteoarthritis, history of hepatic cysts, who presented to the hospital due to acute diarrhea and noted to be positive for C. Difficile.  Patient was also noted to be hypokalemic.  #1 acute diarrhea-this is likely secondary to C. difficile colitis. -Continue enteric precautions, cont. Oral Flagyl.  - diarrhea improved.  Will advance diet to Pureed as per speech and montior.   #2 urinary tract infection-this was based off the urinalysis on admission. -Continue IV ceftriaxone, urine cultures (-) so far.   #3 hypokalemia-this is likely secondary to the diarrhea. - will increase potassium supplements, follow level in a.m. Check Mg. Level.   #4 abnormal LFTs-this could possibly be related to the underlying infection and diarrhea. -Abdominal ultrasound does not show any evidence of acute hepatobiliary pathology. -LFT's trending down.   #5 hypertension-presently hemodynamically stable. Continue metoprolol.  #6 fungal infection-this is located in bilateral groin area. Continue nystatin cream/powder.  #7 dementia-continue Aricept, Seroquel. -Avoid benzodiazepines.  #8 depression-continue Celexa.  #9  left-sided nephrolithiasis-patient is status post left-sided ureteral stent placement about 3 weeks ago. Patient is followed by Dr. Vanna ScotlandAshley Brandon. -No acute abdominal pain or evidence of renal colic.  Pt. Is due to see Dr. Apolinar JunesBrandon for stent removal and will consult her tomorrow.   #10 Bacteremia - pt's BC + for diplococci.  ?? Skin contaminant.  Will await ID & sensitivities.  - cont. Empiric Vanco for now.  If bacteremia truly significant consider ID eval.    All the records are reviewed and case discussed with Care Management/Social Workerr. Management plans discussed with the patient, family and they are in agreement.  CODE STATUS: DO NOT RESUSCITATE  DVT Prophylaxis: Heparin subcutaneous  TOTAL TIME TAKING CARE OF THIS PATIENT: 30 minutes.   POSSIBLE D/C IN 1-2 DAYS, DEPENDING ON CLINICAL CONDITION.   Houston SirenSAINANI,Eduard Penkala J M.D on 01/24/2015 at 9:48 AM  Between 7am to 6pm - Pager - [phone]  After 6pm go to [email or web address] - password [email or web address] [facility]  [facility]  Office  [phone]  CC: Primary care physician; Wynona DoveWALKER,JENNIFER AZBELL, MD

## 2015-01-24 NOTE — Plan of Care (Signed)
Problem: Discharge Progression Outcomes Goal: Discharge plan in place and appropriate Individualization:  [facility]. H/o [name], non-communicative, total care.   Meds crushed with H2O. H/O Alzheimer's demenia, HTN, hypokalemia- controlled with meds.    Goal: Other Discharge Outcomes/Goals Plan of care progress to goals: Fever 102.6, tylenol given with improvement. BP/HR stable. Continue IVF, ABX. 2x loose stools during the night. Stool culture collected and sent to lab. Gram positive diplococci in aerobic and anaerobic bottles, MD notified. No signs of pain nor discomfort. No n/v.

## 2015-01-25 LAB — COMPREHENSIVE METABOLIC PANEL
ALBUMIN: 2.1 g/dL — AB (ref 3.5–5.0)
ALT: 121 U/L — AB (ref 14–54)
AST: 47 U/L — AB (ref 15–41)
Alkaline Phosphatase: 90 U/L (ref 38–126)
Anion gap: 5 (ref 5–15)
BUN: 13 mg/dL (ref 6–20)
CALCIUM: 7.8 mg/dL — AB (ref 8.9–10.3)
CO2: 22 mmol/L (ref 22–32)
CREATININE: 0.83 mg/dL (ref 0.44–1.00)
Chloride: 112 mmol/L — ABNORMAL HIGH (ref 101–111)
GFR calc Af Amer: >60 mL/min (ref >60)
GFR calc non Af Amer: >60 mL/min (ref >60)
Glucose, Bld: 126 mg/dL — ABNORMAL HIGH (ref 65–99)
Potassium: 3.7 mmol/L (ref 3.5–5.1)
Sodium: 139 mmol/L (ref 135–145)
TOTAL PROTEIN: 5.1 g/dL — AB (ref 6.5–8.1)
Total Bilirubin: 0.2 mg/dL — ABNORMAL LOW (ref 0.3–1.2)

## 2015-01-25 LAB — VANCOMYCIN, TROUGH: VANCOMYCIN TR: 13 ug/mL (ref 10–20)

## 2015-01-25 MED ORDER — VANCOMYCIN HCL IN DEXTROSE 750-5 MG/150ML-% IV SOLN
750.0000 mg | Freq: Two times a day (BID) | INTRAVENOUS | Status: DC
Start: 2015-01-25 — End: 2015-01-30
  Administered 2015-01-25 – 2015-01-30 (×10): 750 mg via INTRAVENOUS
  Filled 2015-01-25 (×14): qty 150

## 2015-01-25 MED ORDER — MAGNESIUM SULFATE 4 GM/100ML IV SOLN
4.0000 g | Freq: Once | INTRAVENOUS | Status: AC
  Administered 2015-01-25: 4 g via INTRAVENOUS
  Filled 2015-01-25: qty 100

## 2015-01-25 NOTE — Plan of Care (Addendum)
Problem: Discharge Progression Outcomes Goal: Discharge plan in place and appropriate Individualization:  Outcome: Progressing Individualization of Care:   [facility]    PMH: Dementia/Alzheimers, HTN, CKD, Anemia, Hypokalemia, and nonverbal at baseline - controlled with medications.    High Fall Risk - toileting assistance with hourly rounding.       Goal: Other Discharge Outcomes/Goals Outcome: Progressing Patient remains generally nonverbal, spoke a few words this evening. Does not appear to be in any pain or distress. Loose stools continued.

## 2015-01-25 NOTE — Consult Note (Addendum)
Urology Consult  I have been asked to see the patient by Dr. Sherryll Burger for evaluation and management of kidney stones.  Chief Complaint: Fevers, leukocytosis, bacteremia  History of Present Illness: Kelli Matthews is a 74 y.o. year old female with advanced dementia who was admitted from her assisted living facility with diarrhea 4 days, hypokalemia, leukocytosis.  . The H&P, she was having fevers for 2-3 days followed by  onset of diarrhea. She was also noted to have elevated LFTs and lipase upon admission.  She has been since diagnosed with C. Difficile as well as bacteremia with gram-positive cocci in 4 bottles.  Her urine culture has no growth to date.    She is of patient of [facility] and underwent a staged ureteroscopy procedure on 01/04/2015 for a 1.8 cm left UPJ stone. A stent was placed and she was scheduled for a second ureteroscopy procedure to clear her of her residual stone burden scheduled for next Monday.  She was also presumably treated for urinary tract infection with Bactrim in her nursing home prior to development of fevers and C. difficile. [facility] was not made aware of this nor do we have any culture sensitivity data available for this.    Past Medical History  Diagnosis Date  . Dementia 2012  . Maxillary sinus fracture     after fall 2013, with LOC  . Hypertension 2001    on medicaitons for at least 10 years  . Other esophagitis 2011  . Personal history of tobacco use, presenting hazards to health 2011  . H/O cystitis 2010  . Arthritis 2005  . Rectal mass 2010  . Alzheimer disease   . Chronic kidney disease   . Anemia   . Alzheimer disease   . Kidney stone   . Hypokalemia   . Hepatic cyst     Past Surgical History  Procedure Laterality Date  . Breast reduction surgery  2003  . Breast lumpectomy  1985    Benign per pt, right breast  . Colonoscopy  2004, 2010,2011  . Breast reduction surgery  2003or 2004    by Dr. Genevieve Norlander   . Colon surgery  2010    polyps, Dr. Lemar Livings, hemicolectomy  . Vein ligation and stripping  2005  . Wrist surgery  2005  . Upper gi endoscopy  2011  . Varicose veins  2011    laser surgery  . Cystoscopy/ureteroscopy/holmium laser/stent placement Left 01/04/2015    Procedure: CYSTOSCOPY/LEFTURETEROSCOPY/HOLMIUM LASER/LEFT STENT PLACEMENT/LEFT RETROGRADE;  Surgeon: Vanna Scotland, MD;  Location: [facility] ORS;  Service: Urology;  Laterality: Left;    Home Medications:    Medication List    ASK your doctor about these medications        acetaminophen 325 MG tablet  Commonly known as:  TYLENOL  Take 650 mg by mouth every 4 (four) hours as needed for mild pain or fever.     aspirin EC 81 MG tablet  Take 81 mg by mouth daily.     cholecalciferol 1000 UNITS tablet  Commonly known as:  VITAMIN D  TAKE ONE TABLET BY MOUTH EACH DAY.     citalopram 20 MG tablet  Commonly known as:  CELEXA  Take 20 mg by mouth daily.     colestipol 1 G tablet  Commonly known as:  COLESTID  Take 1 g by mouth 2 (two) times daily as needed.     DERMACLOUD Crea  Apply 1 application topically as needed (apply  to bottom).     desonide 0.05 % cream  Commonly known as:  DESOWEN  Apply 1 application topically as needed (to face).     donepezil 10 MG tablet  Commonly known as:  ARICEPT  TAKE ONE TABLET BY MOUTH EACH DAY AT BEDTIME.     fluticasone 50 MCG/ACT nasal spray  Commonly known as:  FLONASE  Two sprays in each nostril daily.     gentamicin cream 0.1 %  Commonly known as:  GARAMYCIN  APPLY TWICE DAILY TO SKIN LESIONS ON UPPER BACK     HYDROcodone-acetaminophen 5-325 MG per tablet  Commonly known as:  NORCO/VICODIN  Take 1-2 tablets by mouth every 6 (six) hours as needed.     lisinopril-hydrochlorothiazide 10-12.5 MG per tablet  Commonly known as:  PRINZIDE,ZESTORETIC  Take 1 tablet by mouth daily.     loperamide 2 MG tablet  Commonly known as:  IMODIUM A-D  TAKE 1 TAB PO AFTER EACH LOOSE  STOOL. (MAX 3/12HR) IF PROBLEM PERSISTS AFTER 12 HOURS, CALL MD.     memantine 10 MG tablet  Commonly known as:  NAMENDA  Take 10 mg by mouth 2 (two) times daily.     metoprolol succinate 25 MG 24 hr tablet  Commonly known as:  TOPROL-XL  Take 1 tablet (25 mg total) by mouth daily.     mirtazapine 15 MG tablet  Commonly known as:  REMERON  Take 30 mg by mouth at bedtime.     psyllium 58.6 % powder  Commonly known as:  METAMUCIL  Take 1 packet by mouth daily. 15 mL daily     QUEtiapine 25 MG tablet  Commonly known as:  SEROQUEL  Take 25 mg by mouth 3 (three) times daily as needed (agitation).     QUEtiapine 50 MG tablet  Commonly known as:  SEROQUEL  Take 50 mg by mouth at bedtime.     THERAVIM-M Tabs  TAKE ONE TABLET BY MOUTH EACH DAY.     triamcinolone cream 0.1 %  Commonly known as:  KENALOG  Apply 1 application topically as needed (apply to upper back and chest as needed for redness).        Allergies:  Allergies  Allergen Reactions  . Augmentin [Amoxicillin-Pot Clavulanate] Rash  . Latex Rash    Family History  Problem Relation Age of Onset  . Alzheimer's disease Mother   . Stroke Mother   . Ovarian cancer Sister   . Early death Sister 8  . Depression Son     Social History:  reports that she quit smoking about 19 years ago. Her smoking use included Cigarettes. She quit after 17 years of use. She has never used smokeless tobacco. She reports that she does not drink alcohol or use illicit drugs.  ROS: Patient is nonverbal therefore review systems was unable to be performed today.  Physical Exam:  Vital signs in last 24 hours: Temp:  [98.2 F (36.8 C)-101.2 F (38.4 C)] 98.2 F (36.8 C) (05/31 1443) Pulse Rate:  [75-93] 77 (05/31 1443) Resp:  [16-20] 20 (05/31 1443) BP: (129-152)/(59-79) 129/59 mmHg (05/31 1443) SpO2:  [98 %] 98 % (05/31 1443) Weight:  [143 lb 1.6 oz (64.91 kg)] 143 lb 1.6 oz (64.91 kg) (05/31 0557) Constitutional:  Alert but not  interactive responses., No acute distress.  Nephew at the bedside today. HEENT: [place] AT, moist mucus membranes.  Trachea midline, no masses Cardiovascular: Regular rate and rhythm, no clubbing, cyanosis, or edema. Respiratory: Normal respiratory  effort. GI: Abdomen is soft, nontender, nondistended, no abdominal masses GU: No CVA tenderness Skin: No rashes, bruises or suspicious lesions Lymph: No cervical or inguinal adenopathy Neurologic: Grossly intact, no focal deficits, moving all 4 extremities Psychiatric: Flat affect.    Laboratory Data:   Recent Labs  01/22/15 2115 01/23/15 0443 01/24/15 0444  WBC 11.1* 9.9 8.9  HGB 9.8* 8.9* 9.6*  HCT 29.9* 27.0* 29.7*    Recent Labs  01/23/15 0443 01/24/15 0444 01/25/15 0417  NA 139 143 139  K 3.0* 2.8* 3.7  CL 113* 111 112*  CO2 23 26 22   GLUCOSE 92 115* 126*  BUN 34* 17 13  CREATININE 0.96 1.03* 0.83  CALCIUM 7.5* 7.8* 7.8*   No results for input(s): LABPT, INR in the last 72 hours. No results for input(s): LABURIN in the last 72 hours. Results for orders placed or performed during the hospital encounter of 01/22/15  C difficile quick scan w PCR reflex [facility])     Status: None   Collection Time: 01/22/15  9:58 PM  Result Value Ref Range Status   C Diff antigen POSITIVE  Final   C Diff toxin NEGATIVE  Final   C Diff interpretation REFLEX TO PCR  Final  Urine culture     Status: None (Preliminary result)   Collection Time: 01/22/15  9:58 PM  Result Value Ref Range Status   Specimen Description URINE, RANDOM  Final   Special Requests Normal  Final   Culture HOLDING FOR POSSIBLE PATHOGEN  Final   Report Status PENDING  Incomplete  Clostridium Difficile [facility] ([facility])     Status: Abnormal   Collection Time: 01/22/15  9:58 PM  Result Value Ref Range Status   C difficile by pcr POSITIVE (A) NEGATIVE Final    Comment: CRITICAL RESULT CALLED TO, READ BACK BY AND VERIFIED WITH: LUIS FLORES AT 2336 01/22/15.PMH   Culture,  blood (routine x 2)     Status: None (Preliminary result)   Collection Time: 01/22/15 10:53 PM  Result Value Ref Range Status   Specimen Description BLOOD  Final   Special Requests Normal  Final   Culture  Setup Time   Final    GRAM POSITIVE DIPLOCOCCI AEROBIC BOTTLE ONLY CRITICAL RESULT CALLED TO, READ BACK BY AND VERIFIED WITH: Acquanetta Sit/[facility] AT 2114 01/23/15.PMH    Culture   Final    GRAM POSITIVE DIPLOCOCCI AEROBIC BOTTLE ONLY IDENTIFICATION TO FOLLOW    Report Status PENDING  Incomplete  Culture, blood (routine x 2)     Status: None (Preliminary result)   Collection Time: 01/22/15 10:53 PM  Result Value Ref Range Status   Specimen Description BLOOD  Final   Special Requests Normal  Final   Culture  Setup Time   Final    GRAM POSITIVE DIPLOCOCCI IN BOTH AEROBIC AND ANAEROBIC BOTTLES CRITICAL RESULT CALLED TO, READ BACK BY AND VERIFIED WITH: CALLED TO SHAE BREWER ON 01/22/18 AT 1820 BY JEF GRAM POSITIVE DIPLOCOCCI CRITICAL RESULT CALLED TO, READ BACK BY AND VERIFIED WITH: Acquanetta Sit/[facility] AT 2114 01/23/15.PMH    Culture   Final    ENTEROCOCCUS RAFFINOSUS IN BOTH AEROBIC AND ANAEROBIC BOTTLES SUSCEPTIBILITIES TO FOLLOW    Report Status PENDING  Incomplete  Stool culture     Status: None (Preliminary result)   Collection Time: 01/24/15  2:52 AM  Result Value Ref Range Status   Specimen Description STOOL  Final   Special Requests NONE  Final   Culture  Final    NO SALMONELLA OR SHIGELLA ISOLATED No Pathogenic E. coli detected    Report Status PENDING  Incomplete     Radiologic Imaging: EXAM: ULTRASOUND ABDOMEN COMPLETE 01/23/15  COMPARISON: 11/25/2014 abdominal CT  FINDINGS: Gallbladder: No gallstones or wall thickening visualized. No sonographic Murphy sign noted.  Common bile duct: Diameter: 9 mm. Although larger than on 2015 ultrasound, size is similar to 11/25/2014 study. Where visualized, no filling defect. Noted normal bilirubin.  Liver: Lobulated  cysts in the right liver, measuring up to 35 mm near the gallbladder fossa. No evidence of solid mass. Antegrade flow in the imaged portal venous system.  IVC: No abnormality visualized.  Pancreas: Visualized portion unremarkable.  Spleen: Size and appearance within normal limits.  Right Kidney: Length: 10 cm. There is a chronic extrarenal pelvis. No hydronephrosis.  Left Kidney: Length: 12 cm. A ureteral stent is present (seen in the bladder) and hydronephrosis is decreased from comparison CT. Large stone present in the lower pole left kidney, 18 mm in maximal diameter.  Abdominal aorta: No aneurysm visualized.  Other findings: None.  IMPRESSION: 1. No acute findings. 2. Mildly dilated common bile duct (9mm), but stable from March 2016 CT. 3. Decreased left hydronephrosis after stenting. Large left lower pole nephrolithiasis remains. 4. Incidental hepatic cysts.   Impression/Assessment:  73 -year-old female with C. difficile and bacteremia status post ureteroscopy, laser lithotripsy, and ureteral stent placement on 01/04/2015.  UA upon admission is not particularly suspicious on the time of admission for urinary tract infection given her indwelling stent.  Renal ultrasound shows no evidence of significant hydronephrosis and stent appears to be in good position. She has residual stones in her collecting system as anticipated given the staged procedure.  Final urine culture is pending. Etiology of her bacteremia remains unclear although urinary tract source cannot be ruled out.  ID consulted and following.  Patient is improving clinically with defervescent fevers and normalization of leukocytosis on current regimen.  No indication for any GU intervention at this time.  Plan:  1. follow-up urine culture data 2. Abx per ID 3. If patient improves clinically and is on the appropriate biotics, we may be able to proceed with ureteroscopy Monday as scheduled  01/25/2015, 4:29 PM    Vanna Scotland,  MD   Thank you for involving me in this patient's care.  Urology to sign off as there is no indication for intervention at this time.  Please page with any further questions or concerns.

## 2015-01-25 NOTE — Consult Note (Signed)
[facility] Infectious Disease     Reason for Consult: C diff and enterococcal bacteremia    Referring Physician: Max Sane Date of Admission:  01/22/2015   Principal Problem:   C. difficile diarrhea Active Problems:   Hypertension   Dementia with behavioral disturbance   Acute pancreatitis   Hypokalemia   Elevated LFTs   HPI: Kelli Matthews is a 74 y.o. female  With advanced dementia, living at facility with recent UTI and nephrolithiaisis s/p stenting approx 2 weeks ago admitted  5/29 with fevers, diarrhea and confusion. Found to have wbc 15, and C diff +.  Had recently been on bactrim per HPI for UTI. Since admit wbc has normalized and afebrile. Has been on ceftraixone, vanco and flagyl.  Bcx withEnterococcus Raffinosus and UCX pending.   Past Medical History  Diagnosis Date  . Dementia 2012  . Maxillary sinus fracture     after fall 2013, with LOC  . Hypertension 2001    on medicaitons for at least 10 years  . Other esophagitis 2011  . Personal history of tobacco use, presenting hazards to health 2011  . H/O cystitis 2010  . Arthritis 2005  . Rectal mass 2010  . Alzheimer disease   . Chronic kidney disease   . Anemia   . Alzheimer disease   . Kidney stone   . Hypokalemia   . Hepatic cyst    Past Surgical History  Procedure Laterality Date  . Breast reduction surgery  2003  . Breast lumpectomy  1985    Benign per pt, right breast  . Colonoscopy  2004, 2010,2011  . Breast reduction surgery  2003or 2004    by Dr. Cathie Hoops  . Colon surgery  2010    polyps, Dr. Bary Castilla, hemicolectomy  . Vein ligation and stripping  2005  . Wrist surgery  2005  . Upper gi endoscopy  2011  . Varicose veins  2011    laser surgery  . Cystoscopy/ureteroscopy/holmium laser/stent placement Left 01/04/2015    Procedure: CYSTOSCOPY/LEFTURETEROSCOPY/HOLMIUM LASER/LEFT STENT PLACEMENT/LEFT RETROGRADE;  Surgeon: Hollice Espy, MD;  Location: [facility] ORS;  Service: Urology;  Laterality: Left;    History  Substance Use Topics  . Smoking status: Former Smoker -- 17 years    Types: Cigarettes    Quit date: 08/28/1995  . Smokeless tobacco: Never Used     Comment: quit in 1977  . Alcohol Use: No   Family History  Problem Relation Age of Onset  . Alzheimer's disease Mother   . Stroke Mother   . Ovarian cancer Sister   . Early death Sister 80  . Depression Son     Allergies:  Allergies  Allergen Reactions  . Augmentin [Amoxicillin-Pot Clavulanate] Rash  . Latex Rash    Current antibiotics: Antibiotics Given (last 72 hours)    Date/Time Action Medication Dose Rate   01/23/15 1133 Given   metroNIDAZOLE (FLAGYL) tablet 500 mg 500 mg    01/23/15 1701 Given   metroNIDAZOLE (FLAGYL) tablet 500 mg 500 mg    01/23/15 1702 Given   cefTRIAXone (ROCEPHIN) 1 g in dextrose 5 % 50 mL IVPB - Premix 1 g 100 mL/hr   01/23/15 2003 Given   vancomycin (VANCOCIN) IVPB 1000 mg/200 mL premix 1,000 mg 200 mL/hr   01/23/15 2143 Given   metroNIDAZOLE (FLAGYL) tablet 500 mg 500 mg    01/24/15 0622 Given   metroNIDAZOLE (FLAGYL) tablet 500 mg 500 mg    01/24/15 0813 Given   vancomycin (VANCOCIN)  500 mg in sodium chloride 0.9 % 100 mL IVPB 500 mg 100 mL/hr   01/24/15 1533 Given   metroNIDAZOLE (FLAGYL) tablet 500 mg 500 mg    01/24/15 1802 Given   cefTRIAXone (ROCEPHIN) 1 g in dextrose 5 % 50 mL IVPB - Premix 1 g 100 mL/hr   01/24/15 2031 Given   vancomycin (VANCOCIN) 500 mg in sodium chloride 0.9 % 100 mL IVPB 500 mg 100 mL/hr   01/24/15 2248 Given   metroNIDAZOLE (FLAGYL) tablet 500 mg 500 mg    01/25/15 0558 Given   metroNIDAZOLE (FLAGYL) tablet 500 mg 500 mg    01/25/15 3299 Given   vancomycin (VANCOCIN) 500 mg in sodium chloride 0.9 % 100 mL IVPB 500 mg 100 mL/hr      MEDICATIONS: . antiseptic oral rinse  7 mL Mouth Rinse BID  . aspirin EC  81 mg Oral Daily  . cefTRIAXone (ROCEPHIN)  IV  1 g Intravenous Q24H  . citalopram  20 mg Oral Daily  . donepezil  10 mg Oral QHS  .  heparin  5,000 Units Subcutaneous 3 times per day  . memantine  10 mg Oral BID  . metoprolol succinate  25 mg Oral Daily  . metroNIDAZOLE  500 mg Oral 3 times per day  . mirtazapine  30 mg Oral QHS  . nystatin   Topical BID  . potassium chloride  20 mEq Oral BID  . QUEtiapine  50 mg Oral QHS  . vancomycin  500 mg Intravenous Q12H    Review of Systems - 11 systems reviewed and negative per HPI   OBJECTIVE: Temp:  [99.6 F (37.6 C)-101.2 F (38.4 C)] 100 F (37.8 C) (05/31 0546) Pulse Rate:  [75-93] 75 (05/31 0546) Resp:  [16-18] 18 (05/31 0546) BP: (131-152)/(51-79) 152/67 mmHg (05/31 0546) SpO2:  [98 %] 98 % (05/31 0546) Weight:  [64.91 kg (143 lb 1.6 oz)] 64.91 kg (143 lb 1.6 oz) (05/31 0557) Physical Exam  Constitutional:  Basically non verbal during exam, thin, chronically ill appearing HENT: [place]/AT, PERRLA, no scleral icterus Mouth/Throat: Oropharynx is clear and dry. No oropharyngeal exudate.  Cardiovascular: Normal rate, regular rhythm and normal heart sounds. Exam reveals no gallop and no friction rub.  No murmur heard.  Pulmonary/Chest: Effort normal and breath sounds normal. No respiratory distress.  has no wheezes.  Neck supple, no nuchal rigidity Abdominal: Soft. Bowel sounds are normal.  exhibits no distension. There is no tenderness.  Lymphadenopathy: no cervical adenopathy. No axillary adenopathy Neurological: non verbal Skin: Skin is warm and dry. No rash noted. No erythema.  Psychiatric: non verbal, calm  LABS: Results for orders placed or performed during the hospital encounter of 01/22/15 (from the past 48 hour(s))  Stool culture     Status: None (Preliminary result)   Collection Time: 01/24/15  2:52 AM  Result Value Ref Range   Specimen Description STOOL    Special Requests NONE    Culture      NO SALMONELLA OR SHIGELLA ISOLATED No Pathogenic E. coli detected    Report Status PENDING   Comprehensive metabolic panel     Status: Abnormal   Collection  Time: 01/24/15  4:44 AM  Result Value Ref Range   Sodium 143 135 - 145 mmol/L   Potassium 2.8 (LL) 3.5 - 5.1 mmol/L    Comment: RESULTS VERIFIED BY REPEAT TESTING CRITICAL RESULT CALLED TO, READ BACK BY AND VERIFIED WITH MALKA SINWANY AT 2426 01/24/15.PMH    Chloride 111 101 -  111 mmol/L   CO2 26 22 - 32 mmol/L   Glucose, Bld 115 (H) 65 - 99 mg/dL   BUN 17 6 - 20 mg/dL   Creatinine, Ser 1.03 (H) 0.44 - 1.00 mg/dL   Calcium 7.8 (L) 8.9 - 10.3 mg/dL   Total Protein 5.7 (L) 6.5 - 8.1 g/dL   Albumin 2.4 (L) 3.5 - 5.0 g/dL   AST 150 (H) 15 - 41 U/L   ALT 205 (H) 14 - 54 U/L   Alkaline Phosphatase 118 38 - 126 U/L   Total Bilirubin 0.3 0.3 - 1.2 mg/dL   GFR calc non Af Amer 53 (L) >60 mL/min   GFR calc Af Amer >60 >60 mL/min    Comment: (NOTE) The eGFR has been calculated using the CKD EPI equation. This calculation has not been validated in all clinical situations. eGFR's persistently <60 mL/min signify possible Chronic Kidney Disease.    Anion gap 6 5 - 15  CBC     Status: Abnormal   Collection Time: 01/24/15  4:44 AM  Result Value Ref Range   WBC 8.9 3.6 - 11.0 K/uL   RBC 3.50 (L) 3.80 - 5.20 MIL/uL   Hemoglobin 9.6 (L) 12.0 - 16.0 g/dL   HCT 29.7 (L) 35.0 - 47.0 %   MCV 84.8 80.0 - 100.0 fL   MCH 27.5 26.0 - 34.0 pg   MCHC 32.4 32.0 - 36.0 g/dL   RDW 14.7 (H) 11.5 - 14.5 %   Platelets 322 150 - 440 K/uL  Magnesium     Status: Abnormal   Collection Time: 01/24/15  4:44 AM  Result Value Ref Range   Magnesium 1.5 (L) 1.7 - 2.4 mg/dL  Comprehensive metabolic panel     Status: Abnormal   Collection Time: 01/25/15  4:17 AM  Result Value Ref Range   Sodium 139 135 - 145 mmol/L   Potassium 3.7 3.5 - 5.1 mmol/L   Chloride 112 (H) 101 - 111 mmol/L   CO2 22 22 - 32 mmol/L   Glucose, Bld 126 (H) 65 - 99 mg/dL   BUN 13 6 - 20 mg/dL   Creatinine, Ser 0.83 0.44 - 1.00 mg/dL   Calcium 7.8 (L) 8.9 - 10.3 mg/dL   Total Protein 5.1 (L) 6.5 - 8.1 g/dL   Albumin 2.1 (L) 3.5 - 5.0  g/dL   AST 47 (H) 15 - 41 U/L   ALT 121 (H) 14 - 54 U/L   Alkaline Phosphatase 90 38 - 126 U/L   Total Bilirubin 0.2 (L) 0.3 - 1.2 mg/dL   GFR calc non Af Amer >60 >60 mL/min   GFR calc Af Amer >60 >60 mL/min    Comment: (NOTE) The eGFR has been calculated using the CKD EPI equation. This calculation has not been validated in all clinical situations. eGFR's persistently <60 mL/min signify possible Chronic Kidney Disease.    Anion gap 5 5 - 15   No components found for: ESR, C REACTIVE PROTEIN MICRO: Recent Results (from the past 720 hour(s))  C difficile quick scan w PCR reflex [facility])     Status: None   Collection Time: 01/22/15  9:58 PM  Result Value Ref Range Status   C Diff antigen POSITIVE  Final   C Diff toxin NEGATIVE  Final   C Diff interpretation REFLEX TO PCR  Final  Urine culture     Status: None (Preliminary result)   Collection Time: 01/22/15  9:58 PM  Result Value Ref Range  Status   Specimen Description URINE, RANDOM  Final   Special Requests Normal  Final   Culture HOLDING FOR POSSIBLE PATHOGEN  Final   Report Status PENDING  Incomplete  Clostridium Difficile by PCR (not at [facility])     Status: Abnormal   Collection Time: 01/22/15  9:58 PM  Result Value Ref Range Status   C difficile by pcr POSITIVE (A) NEGATIVE Final    Comment: CRITICAL RESULT CALLED TO, READ BACK BY AND VERIFIED WITH: LUIS FLORES AT 2336 01/22/15.PMH   Culture, blood (routine x 2)     Status: None (Preliminary result)   Collection Time: 01/22/15 10:53 PM  Result Value Ref Range Status   Specimen Description BLOOD  Final   Special Requests Normal  Final   Culture  Setup Time   Final    GRAM POSITIVE DIPLOCOCCI AEROBIC BOTTLE ONLY CRITICAL RESULT CALLED TO, READ BACK BY AND VERIFIED WITH: Spero Curb [facility] AT 2114 01/23/15.PMH    Culture   Final    GRAM POSITIVE DIPLOCOCCI AEROBIC BOTTLE ONLY IDENTIFICATION TO FOLLOW    Report Status PENDING  Incomplete  Culture, blood (routine x 2)      Status: None (Preliminary result)   Collection Time: 01/22/15 10:53 PM  Result Value Ref Range Status   Specimen Description BLOOD  Final   Special Requests Normal  Final   Culture  Setup Time   Final    GRAM POSITIVE DIPLOCOCCI IN BOTH AEROBIC AND ANAEROBIC BOTTLES CRITICAL RESULT CALLED TO, READ BACK BY AND VERIFIED WITH: CALLED TO SHAE BREWER ON 01/22/18 AT 1820 BY JEF GRAM POSITIVE DIPLOCOCCI CRITICAL RESULT CALLED TO, READ BACK BY AND VERIFIED WITH: Spero Curb [facility] AT 2114 01/23/15.PMH    Culture   Final    ENTEROCOCCUS RAFFINOSUS IN BOTH AEROBIC AND ANAEROBIC BOTTLES SUSCEPTIBILITIES TO FOLLOW    Report Status PENDING  Incomplete  Stool culture     Status: None (Preliminary result)   Collection Time: 01/24/15  2:52 AM  Result Value Ref Range Status   Specimen Description STOOL  Final   Special Requests NONE  Final   Culture   Final    NO SALMONELLA OR SHIGELLA ISOLATED No Pathogenic E. coli detected    Report Status PENDING  Incomplete    IMAGING: US Abdomen Complete  01/23/2015   CLINICAL DATA:  Elevated liver function tests and lipase  EXAM: ULTRASOUND ABDOMEN COMPLETE  COMPARISON:  11/25/2014 abdominal CT  FINDINGS: Gallbladder: No gallstones or wall thickening visualized. No sonographic Murphy sign noted.  Common bile duct: Diameter: 9 mm. Although larger than on 2015 ultrasound, size is similar to 11/25/2014 study. Where visualized, no filling defect. Noted normal bilirubin.  Liver: Lobulated cysts in the right liver, measuring up to 35 mm near the gallbladder fossa. No evidence of solid mass. Antegrade flow in the imaged portal venous system.  IVC: No abnormality visualized.  Pancreas: Visualized portion unremarkable.  Spleen: Size and appearance within normal limits.  Right Kidney: Length: 10 cm. There is a chronic extrarenal pelvis. No hydronephrosis.  Left Kidney: Length: 12 cm. A ureteral stent is present (seen in the bladder) and hydronephrosis is decreased from  comparison CT. Large stone present in the lower pole left kidney, 18 mm in maximal diameter.  Abdominal aorta: No aneurysm visualized.  Other findings: None.  IMPRESSION: 1. No acute findings. 2. Mildly dilated common bile duct (46m), but stable from March 2016 CT. 3. Decreased left hydronephrosis after stenting. Large left lower pole nephrolithiasis remains. 4.  Incidental hepatic cysts.   Electronically Signed   By: Monte Fantasia M.D.   On: 01/23/2015 00:47   Dg Chest Portable 1 View  01/22/2015   CLINICAL DATA:  Acute onset of diarrhea.  Initial encounter.  EXAM: PORTABLE CHEST - 1 VIEW  COMPARISON:  Chest radiograph performed 10/09/2013  FINDINGS: The lungs are well-aerated and clear. There is no evidence of focal opacification, pleural effusion or pneumothorax.  The cardiomediastinal silhouette is borderline normal in size. No acute osseous abnormalities are seen.  IMPRESSION: No acute cardiopulmonary process seen.   Electronically Signed   By: Garald Balding M.D.   On: 01/22/2015 21:12    Assessment:   SHAREL BEHNE is a 74 y.o. female with advanced dementia, living at facility with recent UTI and nephrolithiaisis s/p stenting approx 2 weeks ago admitted 5/29 with fevers, diarrhea and confusion. Found to have wbc 15, and C diff +.  Had recently been on bactrim per HPI for UTI. Since admit wbc has normalized and defervesced. Has been on ceftraixone, IV vanco and oral flagyl.  Bcx with Enterococcus Raffinosus and UCX pending.  Recommendations Continue vanco until Enterococcus sensitivities back Dc ceftriaxone Cont flagyl oral for C diff Repeat bcx to document clearance of Enterococcus - likely source is the urinary system Thank you very much for allowing me to participate in the care of this patient. Please call with questions.   Cheral Marker. Ola Spurr, MD

## 2015-01-25 NOTE — Progress Notes (Signed)
[facility] [place] [facility]   PATIENT NAME: Kelli Matthews    MR#:  [id number]  DATE OF BIRTH:  [birth year]  SUBJECTIVE:  Still has persistent diarrhea. Son at bedside, pt nonverbal. Still spiking fever.   REVIEW OF SYSTEMS:  Review of Systems  Unable to perform ROS: patient nonverbal    DRUG ALLERGIES:   Allergies  Allergen Reactions  . Augmentin [Amoxicillin-Pot Clavulanate] Rash  . Latex Rash    VITALS:  Blood pressure 152/67, pulse 75, temperature 100 F (37.8 C), temperature source Oral, resp. rate 18, height  (1.651 m), weight 64.91 kg (143 lb 1.6 oz), SpO2 98 %.  PHYSICAL EXAMINATION:  Physical Exam  Constitutional: She is oriented to person, place, and time.  HENT:  Head: Normocephalic and atraumatic.  Eyes: Conjunctivae are normal. Pupils are equal, round, and reactive to light.  Neck: Normal range of motion. Neck supple. No tracheal deviation present. No thyromegaly present.  Cardiovascular: Normal rate and regular rhythm.   Murmur heard.  Systolic murmur is present with a grade of 2/6  RSB  Pulmonary/Chest: Effort normal and breath sounds normal.  Abdominal: Soft. Bowel sounds are normal.  Musculoskeletal: Normal range of motion.  Neurological: She is alert and oriented to person, place, and time.  Skin: Skin is warm and dry.  Psychiatric: Affect normal.  Nonverbal at baseline     LABORATORY PANEL:   CBC  Recent Labs Lab 01/24/15 0444  WBC 8.9  HGB 9.6*  HCT 29.7*  PLT 322   ------------------------------------------------------------------------------------------------------------------  Chemistries   Recent Labs Lab 01/24/15 0444 01/25/15 0417  NA 143 139  K 2.8* 3.7  CL 111 112*  CO2 26 22  GLUCOSE 115* 126*  BUN 17 13  CREATININE 1.03* 0.83  CALCIUM 7.8* 7.8*  MG 1.5*  --   AST 150* 47*  ALT 205* 121*  ALKPHOS 118 90  BILITOT 0.3 0.2*    ------------------------------------------------------------------------------------------------------------------  ASSESSMENT AND PLAN:   74 year old female with past history of dementia, hypertension, chronic kidney disease, history of nephrolithiasis, chronic anemia, osteoarthritis, history of hepatic cysts, who presented to the hospital due to acute diarrhea and noted to be positive for C. Difficile. Patient was also noted to be hypokalemic.  * GPC Bacteremia - wait ID & sensitivities. cont. Empiric Vanco for now.get ID c/s - now running fever also. teamp spiked 101.2  * left-sided nephrolithiasis-patient is status post left-sided ureteral stent placement about 3 weeks ago. Patient is followed by Dr. Vanna Scotland. -No acute abdominal pain or evidence of renal colic. Pt. Is due to see Dr. Apolinar Junes for stent removal and will consult her today  * acute diarrhea-this is likely secondary to C. difficile colitis. -Continue enteric precautions, cont. Oral Flagyl.  - diarrhea improving but still there. Will advance diet to Pureed as per speech and montior.   * urinary tract infection-this was based off the urinalysis on admission. -Continue IV ceftriaxone, urine cultures (-) so far. May be this can be stopped - will await ID input  * hypokalemia-this is likely secondary to the diarrhea. - repleted and resolved  * abnormal LFTs-this could possibly be related to the underlying infection and diarrhea. -Abdominal ultrasound does not show any evidence of acute hepatobiliary pathology. -LFT's trending down.   * hypertension-presently hemodynamically stable. Continue metoprolol.  * fungal infection-this is located in bilateral groin area. Continue nystatin cream/powder.  * dementia-continue Aricept, Seroquel. -Avoid benzodiazepines.  * depression-continue Celexa.   All the  records are reviewed and case discussed with Care Management/Social Workerr. Management plans discussed with  the patient, family (son) and they are in agreement.  CODE STATUS: DNR  TOTAL TIME TAKING CARE OF THIS PATIENT: 35 minutes.   POSSIBLE D/C IN 2-3 DAYS, DEPENDING ON CLINICAL CONDITION. Needs ID and urology EVAL    [facility] CenterHAH, Kelli Matthews M.D on 01/25/2015 at 8:55 AM  Between 7am to 6pm - Pager - [phone]  After 6pm go to [email or web address] - password [email or web address] [facility]  [facility]  Office  [phone]  CC: Primary care physician; Wynona DoveWALKER,JENNIFER AZBELL, MD

## 2015-01-25 NOTE — Progress Notes (Signed)
ANTIBIOTIC CONSULT NOTE - FOLLOW UP  Pharmacy Consult for Vancomycin Indication: Enterococcus bacteremia  Allergies  Allergen Reactions  . Augmentin [Amoxicillin-Pot Clavulanate] Rash  . Latex Rash    Patient Measurements: Height: 5\' 5"  (165.1 cm) Weight: 143 lb 1.6 oz (64.91 kg) IBW/kg (Calculated) : 57   Vital Signs: Temp: 99.9 F (37.7 C) (05/31 2115) Temp Source: Oral (05/31 2115) BP: 144/54 mmHg (05/31 2115) Pulse Rate: 83 (05/31 2115) Intake/Output from previous day: 05/30 0701 - 05/31 0700 In: 971.3 [I.V.:821.3; IV Piggyback:150] Out: -  Intake/Output from this shift:    Labs:  Recent Labs  01/23/15 0443 01/24/15 0444 01/25/15 0417  WBC 9.9 8.9  --   HGB 8.9* 9.6*  --   PLT 282 322  --   CREATININE 0.96 1.03* 0.83   Estimated Creatinine Clearance: 54.3 mL/min (by C-G formula based on Cr of 0.83).  Recent Labs  01/25/15 2028  [facility]     Microbiology: Recent Results (from the past 720 hour(s))  C difficile quick scan w PCR reflex [facility])     Status: None   Collection Time: 01/22/15  9:58 PM  Result Value Ref Range Status   C Diff antigen POSITIVE  Final   C Diff toxin NEGATIVE  Final   C Diff interpretation REFLEX TO PCR  Final  Urine culture     Status: None (Preliminary result)   Collection Time: 01/22/15  9:58 PM  Result Value Ref Range Status   Specimen Description URINE, RANDOM  Final   Special Requests Normal  Final   Culture HOLDING FOR POSSIBLE PATHOGEN  Final   Report Status PENDING  Incomplete  Clostridium Difficile [facility] ([facility])     Status: Abnormal   Collection Time: 01/22/15  9:58 PM  Result Value Ref Range Status   C difficile by pcr POSITIVE (A) NEGATIVE Final    Comment: CRITICAL RESULT CALLED TO, READ BACK BY AND VERIFIED WITH: LUIS FLORES AT 2336 01/22/15.PMH   Culture, blood (routine x 2)     Status: None (Preliminary result)   Collection Time: 01/22/15 10:53 PM  Result Value Ref Range Status   Specimen  Description BLOOD  Final   Special Requests Normal  Final   Culture  Setup Time   Final    GRAM POSITIVE DIPLOCOCCI AEROBIC BOTTLE ONLY CRITICAL RESULT CALLED TO, READ BACK BY AND VERIFIED WITH: Acquanetta Sit/MALKA [facility] AT 2114 01/23/15.PMH    Culture   Final    GRAM POSITIVE DIPLOCOCCI AEROBIC BOTTLE ONLY IDENTIFICATION TO FOLLOW    Report Status PENDING  Incomplete  Culture, blood (routine x 2)     Status: None (Preliminary result)   Collection Time: 01/22/15 10:53 PM  Result Value Ref Range Status   Specimen Description BLOOD  Final   Special Requests Normal  Final   Culture  Setup Time   Final    GRAM POSITIVE DIPLOCOCCI IN BOTH AEROBIC AND ANAEROBIC BOTTLES CRITICAL RESULT CALLED TO, READ BACK BY AND VERIFIED WITH: CALLED TO SHAE BREWER ON 01/22/18 AT 1820 BY JEF GRAM POSITIVE DIPLOCOCCI CRITICAL RESULT CALLED TO, READ BACK BY AND VERIFIED WITH: Acquanetta Sit/MALKA [facility] AT 2114 01/23/15.PMH    Culture   Final    ENTEROCOCCUS RAFFINOSUS IN BOTH AEROBIC AND ANAEROBIC BOTTLES SUSCEPTIBILITIES TO FOLLOW    Report Status PENDING  Incomplete  Stool culture     Status: None (Preliminary result)   Collection Time: 01/24/15  2:52 AM  Result Value Ref Range Status  Specimen Description STOOL  Final   Special Requests NONE  Final   Culture   Final    NO SALMONELLA OR SHIGELLA ISOLATED No Pathogenic E. coli detected    Report Status PENDING  Incomplete    Anti-infectives    Start     Dose/Rate Route Frequency Ordered Stop   01/31/15 0000  gentamicin (GARAMYCIN) IVPB 80 mg  Status:  Discontinued     80 mg 100 mL/hr over 30 Minutes Intravenous  Once 01/23/15 0858 01/23/15 1124   01/31/15 0000  gentamicin (GARAMYCIN) IVPB 80 mg  Status:  Discontinued     80 mg 100 mL/hr over 30 Minutes Intravenous  Once 01/23/15 1127 01/23/15 1137   01/31/15 0000  gentamicin (GARAMYCIN) IVPB 80 mg  Status:  Discontinued     80 mg 100 mL/hr over 30 Minutes Intravenous  Once 01/23/15 1222 01/23/15 1246    01/25/15 2200  vancomycin (VANCOCIN) IVPB 750 mg/150 ml premix     750 mg 150 mL/hr over 60 Minutes Intravenous Every 12 hours 01/25/15 2126     01/24/15 0800  vancomycin (VANCOCIN) 500 mg in sodium chloride 0.9 % 100 mL IVPB  Status:  Discontinued     500 mg 100 mL/hr over 60 Minutes Intravenous Every 12 hours 01/23/15 2150 01/25/15 2126   01/23/15 1930  vancomycin (VANCOCIN) IVPB 1000 mg/200 mL premix     1,000 mg 200 mL/hr over 60 Minutes Intravenous  Once 01/23/15 1917 01/23/15 2103   01/23/15 1800  cefTRIAXone (ROCEPHIN) 1 g in dextrose 5 % 50 mL IVPB - Premix  Status:  Discontinued     1 g 100 mL/hr over 30 Minutes Intravenous Every 24 hours 01/23/15 0126 01/25/15 1456   01/23/15 0830  metroNIDAZOLE (FLAGYL) tablet 500 mg     500 mg Oral 3 times per day 01/23/15 0827 02/06/15 0559   01/22/15 2300  cefTRIAXone (ROCEPHIN) 2 g in dextrose 5 % 50 mL IVPB - Premix     2 g 100 mL/hr over 30 Minutes Intravenous  Once 01/22/15 2241 01/23/15 0058   01/22/15 2245  metroNIDAZOLE (FLAGYL) IVPB 500 mg  Status:  Discontinued     500 mg 100 mL/hr over 60 Minutes Intravenous Every 8 hours 01/22/15 2241 01/23/15 0828      Assessment: Blood cultures growing Enterococcus. ID to continue vancomycin until sensitivities are back. Vancomycin trough is slightly below goal.   Goal of Therapy:  Vancomycin trough level 15-20 mcg/ml  Plan:  Will increase vancomycin to 750 mg iv q 12 h and schedule a trough with the 4th dose of the new regimen.    Luisa Hart D 01/25/2015,9:26 PM

## 2015-01-26 LAB — CBC
HCT: 28.9 % — ABNORMAL LOW (ref 35.0–47.0)
Hemoglobin: 9.4 g/dL — ABNORMAL LOW (ref 12.0–16.0)
MCH: 27.4 pg (ref 26.0–34.0)
MCHC: 32.6 g/dL (ref 32.0–36.0)
MCV: 84.1 fL (ref 80.0–100.0)
Platelets: 360 10*3/uL (ref 150–440)
RBC: 3.44 MIL/uL — ABNORMAL LOW (ref 3.80–5.20)
RDW: 14.9 % — ABNORMAL HIGH (ref 11.5–14.5)
WBC: 13.9 10*3/uL — ABNORMAL HIGH (ref 3.6–11.0)

## 2015-01-26 LAB — POTASSIUM: Potassium: 3.9 mmol/L (ref 3.5–5.1)

## 2015-01-26 LAB — MAGNESIUM: Magnesium: 2.2 mg/dL (ref 1.7–2.4)

## 2015-01-26 NOTE — Progress Notes (Signed)
Speech Language Pathology Treatment: Dysphagia  Patient Details Name: Kelli FlemingRachel E Mankowski MRN: [id number] DOB: 03/09/1941 Today's Date: 01/26/2015 Time: [id number] SLP Time Calculation (min) (ACUTE ONLY): 51 min  Assessment / Plan / Recommendation Clinical Impression  Pt appears to be tolerating her current diet of Dys. I (puree) w/ thin liquids following aspiration precautions. Pt consumed trials of each w/ SLP w/ no overt s/s of aspiration; no significant oral phase deficits noted as well - pt cleared orally b/t boluses and demo. adequate bolus management. Pt required feeding and encouragement to take the po's; she was mostly nonverbal during interaction w/ her. Pt is at reduced risk for aspiration w/ current diet following aspiration precautions; however, min. risk for aspiration is present sec. to pt's declined Cognitive status. ST will f/u w/ pt's status and toleration of diet while admitted. NSG updated.   HPI Other Pertinent Information: pt is mostly nonverbal except to say "uh-huh" to questions if she wanted more po's. Pt required feeding and did not attempt to feed self.    Pertinent Vitals Pain Assessment: No/denies pain (no faces indicated)  SLP Plan  Continue with current plan of care    Recommendations Diet recommendations: Thin liquid;Dysphagia 1 (puree) Liquids provided via: Straw;Cup Medication Administration: Crushed with puree Supervision: Full supervision/cueing for compensatory strategies Compensations: Slow rate;Small sips/bites Postural Changes and/or Swallow Maneuvers: Seated upright 90 degrees              Oral Care Recommendations: Oral care BID;Oral care before and after PO;Staff/trained caregiver to provide oral care Follow up Recommendations: Skilled Nursing facility Plan: Continue with current plan of care    GO     [facility] Hospital[facility]son,Katherine 01/26/2015, 3:53 PM

## 2015-01-26 NOTE — Plan of Care (Signed)
Problem: Discharge Progression Outcomes Goal: Discharge plan in place and appropriate Individualization:  [facility]    PMH: Dementia/Alzheimers, HTN, CKD, Anemia, Hypokalemia, and nonverbal at baseline - controlled with medications.    High Fall Risk - toileting assistance with hourly rounding.   Goal: Other Discharge Outcomes/Goals Outcome: Progressing Plan of care progress to goal for: cidiff/diarrhea - continues on ABX. - No complaints of pain. - low grade temp this shift, will continue to monitor.

## 2015-01-26 NOTE — Progress Notes (Signed)
Date of Admission:  01/22/2015     ID: Kelli Matthews is a 74 y.o. female with  UTI and bacteremia  Principal Problem:   C. difficile diarrhea Active Problems:   Hypertension   Dementia with behavioral disturbance   Acute pancreatitis   Hypokalemia   Elevated LFTs   Subjective: No fevers, remains confused  Medications:  . antiseptic oral rinse  7 mL Mouth Rinse BID  . aspirin EC  81 mg Oral Daily  . citalopram  20 mg Oral Daily  . donepezil  10 mg Oral QHS  . heparin  5,000 Units Subcutaneous 3 times per day  . memantine  10 mg Oral BID  . metoprolol succinate  25 mg Oral Daily  . metroNIDAZOLE  500 mg Oral 3 times per day  . mirtazapine  30 mg Oral QHS  . nystatin   Topical BID  . potassium chloride  20 mEq Oral BID  . QUEtiapine  50 mg Oral QHS  . vancomycin  750 mg Intravenous Q12H    Objective: Vital signs in last 24 hours: Temp:  [98.6 F (37 C)-99.9 F (37.7 C)] 98.6 F (37 C) (06/01 1301) Pulse Rate:  [81-85] 85 (06/01 1301) Resp:  [16-18] 16 (06/01 1301) BP: (121-144)/(54-65) 130/62 mmHg (06/01 1301) SpO2:  [97 %-98 %] 98 % (06/01 1301) Weight:  [61.78 kg (136 lb 3.2 oz)] 61.78 kg (136 lb 3.2 oz) (06/01 0527) Constitutional: Basically non verbal during exam, thin, chronically ill appearing HENT: [place]/AT, PERRLA, no scleral icterus Mouth/Throat: Oropharynx is clear and dry. No oropharyngeal exudate.  Cardiovascular: Normal rate, regular rhythm and normal heart sounds. Exam reveals no gallop and no friction rub.  No murmur heard.  Pulmonary/Chest: Effort normal and breath sounds normal. No respiratory distress. has no wheezes.  Neck supple, no nuchal rigidity Abdominal: Soft. Bowel sounds are normal. exhibits no distension. There is no tenderness.  Lymphadenopathy: no cervical adenopathy. No axillary adenopathy Neurological: non verbal Skin: Skin is warm and dry. No rash noted. No erythema.  Psychiatric: non verbal, calm  Lab Results  Recent  Labs  01/24/15 0444 01/25/15 0417 01/26/15 0628  WBC 8.9  --  13.9*  HGB 9.6*  --  9.4*  HCT 29.7*  --  28.9*  NA 143 139  --   K 2.8* 3.7 3.9  CL 111 112*  --   CO2 26 22  --   BUN 17 13  --   CREATININE 1.03* 0.83  --    Liver Panel  Recent Labs  01/24/15 0444 01/25/15 0417  PROT 5.7* 5.1*  ALBUMIN 2.4* 2.1*  AST 150* 47*  ALT 205* 121*  ALKPHOS 118 90  BILITOT 0.3 0.2*    Microbiology: Results for orders placed or performed during the hospital encounter of 01/22/15  C difficile quick scan w PCR reflex [facility])     Status: None   Collection Time: 01/22/15  9:58 PM  Result Value Ref Range Status   C Diff antigen POSITIVE  Final   C Diff toxin NEGATIVE  Final   C Diff interpretation REFLEX TO PCR  Final  Urine culture     Status: None (Preliminary result)   Collection Time: 01/22/15  9:58 PM  Result Value Ref Range Status   Specimen Description URINE, RANDOM  Final   Special Requests Normal  Final   Culture HOLDING FOR POSSIBLE PATHOGEN  Final   Report Status PENDING  Incomplete  Clostridium Difficile by PCR (not at [facility])  Status: Abnormal   Collection Time: 01/22/15  9:58 PM  Result Value Ref Range Status   C difficile by pcr POSITIVE (A) NEGATIVE Final    Comment: CRITICAL RESULT CALLED TO, READ BACK BY AND VERIFIED WITH: LUIS FLORES AT 2336 01/22/15.PMH   Culture, blood (routine x 2)     Status: None (Preliminary result)   Collection Time: 01/22/15 10:53 PM  Result Value Ref Range Status   Specimen Description BLOOD  Final   Special Requests Normal  Final   Culture  Setup Time   Final    GRAM POSITIVE DIPLOCOCCI AEROBIC BOTTLE ONLY CRITICAL RESULT CALLED TO, READ BACK BY AND VERIFIED WITH: Acquanetta Sit/MALKA [facility] AT 2114 01/23/15.PMH    Culture   Final    ENTEROCOCCUS RAFFINOSUS AEROBIC BOTTLE ONLY REFER TO OTHER SET FOR SENSITIVITIES ONCE COMPLETED    Report Status PENDING  Incomplete  Culture, blood (routine x 2)     Status: None (Preliminary result)    Collection Time: 01/22/15 10:53 PM  Result Value Ref Range Status   Specimen Description BLOOD  Final   Special Requests Normal  Final   Culture  Setup Time   Final    GRAM POSITIVE DIPLOCOCCI IN BOTH AEROBIC AND ANAEROBIC BOTTLES CRITICAL RESULT CALLED TO, READ BACK BY AND VERIFIED WITH: CALLED TO SHAE BREWER ON 01/22/18 AT 1820 BY JEF GRAM POSITIVE DIPLOCOCCI CRITICAL RESULT CALLED TO, READ BACK BY AND VERIFIED WITH: Acquanetta Sit/[facility]ALKA [facility] AT 2114 01/23/15.PMH    Culture   Final    ENTEROCOCCUS RAFFINOSUS IN BOTH AEROBIC AND ANAEROBIC BOTTLES PERFORMING SENSITIVITIES VIA ALTERNATIVE METHOD    Report Status PENDING  Incomplete  Stool culture     Status: None (Preliminary result)   Collection Time: 01/24/15  2:52 AM  Result Value Ref Range Status   Specimen Description STOOL  Final   Special Requests NONE  Final   Culture   Final    NO SALMONELLA OR SHIGELLA ISOLATED No Pathogenic E. coli detected    Report Status PENDING  Incomplete  Culture, blood (routine x 2)     Status: None (Preliminary result)   Collection Time: 01/25/15  3:20 PM  Result Value Ref Range Status   Specimen Description BLOOD  Final   Special Requests BLOOD  Final   Culture NO GROWTH < 24 HOURS  Final   Report Status PENDING  Incomplete  Culture, blood (routine x 2)     Status: None (Preliminary result)   Collection Time: 01/25/15  3:25 PM  Result Value Ref Range Status   Specimen Description BLOOD  Final   Special Requests BLOOD  Final   Culture NO GROWTH < 24 HOURS  Final   Report Status PENDING  Incomplete    Studies/Results: No results found.   Assessment/Plan: Kelli Matthews is a 74 y.o. female with advanced dementia, living at facility with recent UTI and nephrolithiaisis s/p stenting approx 2 weeks ago admitted 5/29 with fevers, diarrhea and confusion. Found to have wbc 15, and C diff +. Had recently been on bactrim per HPI for UTI. Since admit wbc has normalized and defervesced. Has been on  ceftraixone, IV vanco and oral flagyl. Bcx with Enterococcus Raffinosus and UCX pending.  Recommendations Continue vanco until Enterococcus sensitivities back Stopped ceftriaxone 5/31 Cont flagyl oral for C diff Repeat bcx 5/31neg to date - likely source is the urinary system Thank you very much for allowing me to participate in the care of this patient. Please call with questions  Kaylin Schellenberg   01/26/2015, 3:18 PM

## 2015-01-26 NOTE — Progress Notes (Signed)
[facility] [place] [facility]   PATIENT NAME: Kelli RoysRachel Matthews    MR#:  [id number]  DATE OF BIRTH:  07/15/1941  SUBJECTIVE:  Still has persistent diarrhea. Son at bedside, pt nonverbal. Still spiking fever.   REVIEW OF SYSTEMS:  Review of Systems  Unable to perform ROS: dementia   DRUG ALLERGIES:   Allergies  Allergen Reactions  . Augmentin [Amoxicillin-Pot Clavulanate] Rash  . Latex Rash    VITALS:  Blood pressure 121/65, pulse 81, temperature 98.7 F (37.1 C), temperature source Oral, resp. rate 18, height 5\' 5"  (1.651 m), weight 61.78 kg (136 lb 3.2 oz), SpO2 98 %.  PHYSICAL EXAMINATION:  Physical Exam  Constitutional: She is oriented to person, place, and time.  HENT:  Head: Normocephalic and atraumatic.  Eyes: Conjunctivae are normal. Pupils are equal, round, and reactive to light.  Neck: Normal range of motion. Neck supple. No tracheal deviation present. No thyromegaly present.  Cardiovascular: Normal rate and regular rhythm.   Murmur heard.  Systolic murmur is present with a grade of 2/6  RSB  Pulmonary/Chest: Effort normal and breath sounds normal.  Abdominal: Soft. Bowel sounds are normal.  Musculoskeletal: Normal range of motion.  Neurological: She is alert and oriented to person, place, and time.  Skin: Skin is warm and dry.  Psychiatric: Affect normal.  Nonverbal at baseline     LABORATORY PANEL:   CBC  Recent Labs Lab 01/26/15 0628  WBC 13.9*  HGB 9.4*  HCT 28.9*  PLT 360   ------------------------------------------------------------------------------------------------------------------  Chemistries   Recent Labs Lab 01/25/15 0417 01/26/15 0628  NA 139  --   K 3.7 3.9  CL 112*  --   CO2 22  --   GLUCOSE 126*  --   BUN 13  --   CREATININE 0.83  --   CALCIUM 7.8*  --   MG  --  2.2  AST 47*  --   ALT 121*  --   ALKPHOS 90  --   BILITOT 0.2*  --     ------------------------------------------------------------------------------------------------------------------  ASSESSMENT AND PLAN:   74 year old female with past history of dementia, hypertension, chronic kidney disease, history of nephrolithiasis, chronic anemia, osteoarthritis, history of hepatic cysts, who presented to the hospital due to acute diarrhea and noted to be positive for C. Difficile. Patient was also noted to be hypokalemic.  * GPC Bacteremia - wait ID & sensitivities. cont. Empiric Vanco for now until c/s back.Marland Kitchen.appreciate ID input. Stopped rocephin. Continue PO flagyl  * left-sided nephrolithiasis-patient is status post left-sided ureteral stent placement about 3 weeks ago. Patient is followed by Dr. Vanna ScotlandAshley Brandon. -No acute abdominal pain or evidence of renal colic. APPRECIATE Dr. Delana MeyerBrandon's input.  * Acute diarrhea-this is likely secondary to C. difficile colitis. -Continue enteric precautions, cont. Oral Flagyl.  - diarrhea improving but still there. Will advance diet to Pureed as per speech and montior.   * urinary tract infection-this was based off the urinalysis on admission. -Continue IV ceftriaxone, urine cultures (-) so far. May be this can be stopped - will await ID input  * hypokalemia-this is likely secondary to the diarrhea. - repleted and resolved  * abnormal LFTs-this could possibly be related to the underlying infection and diarrhea. -Abdominal ultrasound does not show any evidence of acute hepatobiliary pathology. -LFT's trending down.   * hypertension-presently hemodynamically stable. Continue metoprolol.  * fungal infection-this is located in bilateral groin area. Continue nystatin cream/powder.  * dementia-continue Aricept, Seroquel. -Avoid  benzodiazepines.  * depression-continue Celexa.   All the records are reviewed and case discussed with Care Management/Social Workerr. Management plans discussed with the patient, family (son)  and they are in agreement.  CODE STATUS: DNR  TOTAL TIME TAKING CARE OF THIS PATIENT: 35 minutes.   POSSIBLE D/C IN 1-2 DAYS, DEPENDING ON CLINICAL CONDITION. Needs blood c/s and ID input for d/c plans.   [facility], Laryssa Hassing M.D on 01/26/2015 at 11:01 AM  Between 7am to 6pm - Pager - [phone]  After 6pm go to [email or web address] - password [email or web address] [facility]  [facility]  Office  [phone]  CC: Primary care physician; Wynona Dove, MD

## 2015-01-27 LAB — VANCOMYCIN, TROUGH: VANCOMYCIN TR: 18 ug/mL (ref 10–20)

## 2015-01-27 LAB — STOOL CULTURE

## 2015-01-27 LAB — CULTURE, BLOOD (ROUTINE X 2)
SPECIAL REQUESTS: NORMAL
SPECIAL REQUESTS: NORMAL

## 2015-01-27 NOTE — Clinical Social Work Note (Signed)
Clinical Social Worker is continuing to follow for discharge planning needs. [facility]. No discharge discharge. CSW will continue to follow.   Dede QuerySarah Makenah Karas, MSW, LCSW Clinical Social Worker  [phone]

## 2015-01-27 NOTE — Plan of Care (Addendum)
Problem: Discharge Progression Outcomes Goal: Discharge plan in place and appropriate Individualization:  [facility].     PMH: Dementia/Alzheimers, HTN, CKD, Anemia, Hypokalemia, and nonverbal at baseline - controlled with medications.     High Fall Risk - toileting assistance with hourly rounding Goal: Other Discharge Outcomes/Goals - continues on ABX. - No complaints of pain. - Fever this shift, tylenol given with improvement.

## 2015-01-27 NOTE — Progress Notes (Signed)
Initial Nutrition Assessment  DOCUMENTATION CODES:     INTERVENTION: Meals and Snacks: Cater to patient preferences within diet recommendations per SLP Medical Food Supplement Therapy: will send Mighty Shakes daily and Magic Cup BID for added nutrition Magic cup  NUTRITION DIAGNOSIS:  Swallowing difficulty related to dysphagia as evidenced by  (diet order, SLP evaluation).  GOAL:  Patient will meet greater than or equal to 90% of their needs  MONITOR:   (Energy Intake, digestive System, Electrolyte and Renal Profile, Hepatic Profile)  REASON FOR ASSESSMENT:   (RD Screen Length of Stay)    ASSESSMENT:  Pt admitted with diarrhea and UTI; noted c.diff positive. Pt nonverbal per chart, asleep on first visit with MD on second visit.  PMHx: Past Medical History  Diagnosis Date  . Dementia 2012  . Maxillary sinus fracture     after fall 2013, with LOC  . Hypertension 2001    on medicaitons for at least 10 years  . Other esophagitis 2011  . Personal history of tobacco use, presenting hazards to health 2011  . H/O cystitis 2010  . Arthritis 2005  . Rectal mass 2010  . Alzheimer disease   . Chronic kidney disease   . Anemia   . Alzheimer disease   . Kidney stone   . Hypokalemia   . Hepatic cyst    PO Intake: pt tray in room this am had not eaten. Per RN Mendel Ryder pt is a feeder. Per MST no decrease in appetite PTA. Recorded 75% of dinner on 5/29. Limited documentation since secondary to pt on isolation.  Medications: Remeron, Flagyl, KCl Labs: Electrolyte and Renal Profile:  Recent Labs Lab 01/23/15 0443 01/24/15 0444 01/25/15 0417 01/26/15 0628  BUN 34* 17 13  --   CREATININE 0.96 1.03* 0.83  --   NA 139 143 139  --   K 3.0* 2.8* 3.7 3.9  MG  --  1.5*  --  2.2  Glucose Profile: No results for input(s): GLUCAP in the last 72 hours. Protein Profile:  Recent Labs Lab 01/23/15 0443 01/24/15 0444 01/25/15 0417  ALBUMIN 2.3* 2.4* 2.1*  Hepatic  Profile: Hepatic Function Latest Ref Rng 01/25/2015 01/24/2015 01/23/2015  Total Protein 6.5 - 8.1 g/dL 5.1(L) 5.7(L) 5.6(L)  Albumin 3.5 - 5.0 g/dL 2.1(L) 2.4(L) 2.3(L)  AST 15 - 41 U/L 47(H) 150(H) 290(H)  ALT 14 - 54 U/L 121(H) 205(H) 260(H)  Alk Phosphatase 38 - 126 U/L 90 118 90  Total Bilirubin 0.3 - 1.2 mg/dL 0.2(L) 0.3 0.2(L)  Bilirubin, Direct 0.1 - 0.5 mg/dL - - -   Unable to determine weight trend PTA, per CHL pt with weight gain since August 2015 and weight gain this month. Per MST no decrease in weight reported.  Height:  Ht Readings from Last 1 Encounters:  01/22/15 5' 5"  (1.651 m)    Weight:  Wt Readings from Last 1 Encounters:  01/27/15 137 lb (62.143 kg)    Wt Readings from Last 10 Encounters:  01/27/15 137 lb (62.143 kg)  01/04/15 132 lb (59.875 kg)  12/28/14 132 lb (59.875 kg)  10/25/14 130 lb 6 oz (59.138 kg)  04/21/14 128 lb 8 oz (58.287 kg)  12/31/13 139 lb (63.05 kg)  06/22/13 141 lb (63.957 kg)  06/03/13 140 lb (63.504 kg)  01/26/13 127 lb 8 oz (57.834 kg)  12/15/12 125 lb (56.7 kg)    Unable to complete Nutrition-Focused physical exam at this time.   BMI:  Body mass index is 22.8 kg/(m^2).  Diet Order:  DIET - DYS 1 Room service appropriate?: No; Fluid consistency:: Thin DIET - DYS 1  EDUCATION NEEDS:  Education needs no appropriate at this time  No intake or output data in the 24 hours ending 01/27/15 0953  Last BM:  6/1 multiple loose stools, c.diff positive  LOW Care Level  Dwyane Luo, RD, LDN Pager [phone]

## 2015-01-27 NOTE — Progress Notes (Signed)
[facility] - [place] at [facility]   PATIENT NAME: Kelli Matthews    MR#:  [id number]  DATE OF BIRTH:  08-31-1940  SUBJECTIVE:  Patient able to squeeze my hands on command. I did wake the patient up from sleep today. She just looked at me. She spiked fever last night.  REVIEW OF SYSTEMS:  ROS: Unable to obtain, patient's baseline is nonverbal. DRUG ALLERGIES:   Allergies  Allergen Reactions  . Augmentin [Amoxicillin-Pot Clavulanate] Rash  . Latex Rash    VITALS:  Blood pressure 133/76, pulse 84, temperature 98.2 F (36.8 C), temperature source Oral, resp. rate 16, height  (1.651 m), weight 62.143 kg (137 lb), SpO2 100 %.  PHYSICAL EXAMINATION:  Physical Exam  HENT:  Head: Normocephalic and atraumatic.  Eyes: Conjunctivae are normal. Pupils are equal, round, and reactive to light.  Neck: Normal range of motion. Neck supple. No tracheal deviation present. No thyromegaly present.  Cardiovascular: Normal rate and regular rhythm.   Murmur heard.  Systolic murmur is present with a grade of 2/6  RSB  Pulmonary/Chest: Effort normal and breath sounds normal.  Abdominal: Soft. Bowel sounds are normal. There is no hepatosplenomegaly. There is no tenderness.  Musculoskeletal: Normal range of motion.       Right ankle: She exhibits no swelling.       Left ankle: She exhibits no swelling.  Neurological: She is alert.  Skin: Skin is warm and dry.  Psychiatric: Affect normal.  Nonverbal at baseline     LABORATORY PANEL:   CBC  Recent Labs Lab 01/26/15 0628  WBC 13.9*  HGB 9.4*  HCT 28.9*  PLT 360   Chemistries   Recent Labs Lab 01/25/15 0417 01/26/15 0628  NA 139  --   K 3.7 3.9  CL 112*  --   CO2 22  --   GLUCOSE 126*  --   BUN 13  --   CREATININE 0.83  --   CALCIUM 7.8*  --   MG  --  2.2  AST 47*  --   ALT 121*  --   ALKPHOS 90  --   BILITOT 0.2*  --     ASSESSMENT AND PLAN:   *Bacteremia with enterococcus, urinary tract  infection - Empiric vancomycin as per infectious disease. I spoke with microbiologist the enterococcus is sensitive to ampicillin Zyvox and vancomycin and resistant to Levaquin. Patient hasn't Augmentin allergy listed in the computer. I will leave choice of antibody up to Dr. Sampson Goon infectious disease specialist. Repeat blood cultures so far negative. The patient had a fever last night, so disposition will not be today.  *Clostridium difficile colitis- on Flagyl  * left-sided nephrolithiasis-patient is status post left-sided ureteral stent placement about 3 weeks ago. Patient is followed by Dr. Vanna Scotland. -No acute abdominal pain or evidence of renal colic. APPRECIATE Dr. Delana Meyer input.  * hypokalemia-this is likely secondary to the diarrhea. - repleted and resolved  * abnormal LFTs-this could possibly be related to the underlying infection and diarrhea. -Abdominal ultrasound does not show any evidence of acute hepatobiliary pathology. -LFT's trending down.   * hypertension-presently hemodynamically stable. Continue metoprolol.  * fungal infection-this is located in bilateral groin area. Continue nystatin cream/powder.  * dementia-continue Aricept, Seroquel. -Avoid benzodiazepines.  * depression-continue Celexa.   All the records are reviewed and case discussed with Care Management/Social Workerr. Management plans discussed with the patient, and nursing staff.  CODE STATUS: DNR  TOTAL TIME TAKING CARE OF  THIS PATIENT: 22 minutes.   POSSIBLE D/C IN 1-2 DAYS, DEPENDING ON CLINICAL CONDITION and temperature curve.   Alford HighlandWIETING, Dolores Ewing M.D on 01/27/2015 at 9:57 AM  Between 7am to 6pm - Pager - [phone]  After 6pm go to [email or web address] - Scientist, research (life sciences)password [email or web address]  [facility]  Office  [phone]

## 2015-01-27 NOTE — Progress Notes (Signed)
ANTIBIOTIC CONSULT NOTE - FOLLOW UP  Pharmacy Consult for Vancomycin Indication: Enterococcus bacteremia  Allergies  Allergen Reactions  . Augmentin [Amoxicillin-Pot Clavulanate] Rash  . Latex Rash    Patient Measurements: Height:  (165.1 cm) Weight: 137 lb (62.143 kg) IBW/kg (Calculated) : 57   Vital Signs: Temp: 98.2 F (36.8 C) (06/02 0906) Temp Source: Oral (06/02 0906) BP: 133/76 mmHg (06/02 0906) Pulse Rate: 84 (06/02 0906) Intake/Output from previous day:   Intake/Output from this shift:    Labs:  Recent Labs  01/25/15 0417 01/26/15 0628  WBC  --  13.9*  HGB  --  9.4*  PLT  --  360  CREATININE 0.83  --    Estimated Creatinine Clearance: 54.3 mL/min (by C-G formula based on Cr of 0.83).  Recent Labs  01/25/15 2028 01/27/15 0911  VANCOTROUGH 13 18     Microbiology: Recent Results (from the past 720 hour(s))  C difficile quick scan w PCR reflex [facility])     Status: None   Collection Time: 01/22/15  9:58 PM  Result Value Ref Range Status   C Diff antigen POSITIVE  Final   C Diff toxin NEGATIVE  Final   C Diff interpretation REFLEX TO PCR  Final  Urine culture     Status: None (Preliminary result)   Collection Time: 01/22/15  9:58 PM  Result Value Ref Range Status   Specimen Description URINE, RANDOM  Final   Special Requests Normal  Final   Culture HOLDING FOR POSSIBLE PATHOGEN  Final   Report Status PENDING  Incomplete  Clostridium Difficile by PCR (not at [facility])     Status: Abnormal   Collection Time: 01/22/15  9:58 PM  Result Value Ref Range Status   C difficile by pcr POSITIVE (A) NEGATIVE Final    Comment: CRITICAL RESULT CALLED TO, READ BACK BY AND VERIFIED WITH: LUIS FLORES AT 2336 01/22/15.PMH   Culture, blood (routine x 2)     Status: None   Collection Time: 01/22/15 10:53 PM  Result Value Ref Range Status   Specimen Description BLOOD  Final   Special Requests Normal  Final   Culture  Setup Time   Final    GRAM POSITIVE  DIPLOCOCCI AEROBIC BOTTLE ONLY CRITICAL RESULT CALLED TO, READ BACK BY AND VERIFIED WITH: Acquanetta Sit [facility] AT 2114 01/23/15.PMH    Culture   Final    ENTEROCOCCUS RAFFINOSUS AEROBIC BOTTLE ONLY REFER TO OTHER SET FOR SENSITIVITIES     Report Status 01/27/2015 FINAL  Final  Culture, blood (routine x 2)     Status: None (Preliminary result)   Collection Time: 01/22/15 10:53 PM  Result Value Ref Range Status   Specimen Description BLOOD  Final   Special Requests Normal  Final   Culture  Setup Time   Final    GRAM POSITIVE DIPLOCOCCI IN BOTH AEROBIC AND ANAEROBIC BOTTLES CRITICAL RESULT CALLED TO, READ BACK BY AND VERIFIED WITH: CALLED TO SHAE BREWER ON 01/22/18 AT 1820 BY JEF GRAM POSITIVE DIPLOCOCCI CRITICAL RESULT CALLED TO, READ BACK BY AND VERIFIED WITH: Acquanetta Sit [facility] AT 2114 01/23/15.PMH    Culture   Final    ENTEROCOCCUS RAFFINOSUS IN BOTH AEROBIC AND ANAEROBIC BOTTLES PERFORMING SENSITIVITIES VIA ALTERNATIVE METHOD    Report Status PENDING  Incomplete  Stool culture     Status: None (Preliminary result)   Collection Time: 01/24/15  2:52 AM  Result Value Ref Range Status   Specimen Description STOOL  Final   Special Requests NONE  Final   Culture   Final    NO SALMONELLA OR SHIGELLA ISOLATED No Pathogenic E. coli detected    Report Status PENDING  Incomplete  Culture, blood (routine x 2)     Status: None (Preliminary result)   Collection Time: 01/25/15  3:20 PM  Result Value Ref Range Status   Specimen Description BLOOD  Final   Special Requests BLOOD  Final   Culture NO GROWTH < 24 HOURS  Final   Report Status PENDING  Incomplete  Culture, blood (routine x 2)     Status: None (Preliminary result)   Collection Time: 01/25/15  3:25 PM  Result Value Ref Range Status   Specimen Description BLOOD  Final   Special Requests BLOOD  Final   Culture NO GROWTH < 24 HOURS  Final   Report Status PENDING  Incomplete    Anti-infectives    Start     Dose/Rate Route Frequency  Ordered Stop   01/31/15 0000  gentamicin (GARAMYCIN) IVPB 80 mg  Status:  Discontinued     80 mg 100 mL/hr over 30 Minutes Intravenous  Once 01/23/15 0858 01/23/15 1124   01/31/15 0000  gentamicin (GARAMYCIN) IVPB 80 mg  Status:  Discontinued     80 mg 100 mL/hr over 30 Minutes Intravenous  Once 01/23/15 1127 01/23/15 1137   01/31/15 0000  gentamicin (GARAMYCIN) IVPB 80 mg  Status:  Discontinued     80 mg 100 mL/hr over 30 Minutes Intravenous  Once 01/23/15 1222 01/23/15 1246   01/25/15 2200  vancomycin (VANCOCIN) IVPB 750 mg/150 ml premix     750 mg 150 mL/hr over 60 Minutes Intravenous Every 12 hours 01/25/15 2126     01/24/15 0800  vancomycin (VANCOCIN) 500 mg in sodium chloride 0.9 % 100 mL IVPB  Status:  Discontinued     500 mg 100 mL/hr over 60 Minutes Intravenous Every 12 hours 01/23/15 2150 01/25/15 2126   01/23/15 1930  vancomycin (VANCOCIN) IVPB 1000 mg/200 mL premix     1,000 mg 200 mL/hr over 60 Minutes Intravenous  Once 01/23/15 1917 01/23/15 2103   01/23/15 1800  cefTRIAXone (ROCEPHIN) 1 g in dextrose 5 % 50 mL IVPB - Premix  Status:  Discontinued     1 g 100 mL/hr over 30 Minutes Intravenous Every 24 hours 01/23/15 0126 01/25/15 1456   01/23/15 0830  metroNIDAZOLE (FLAGYL) tablet 500 mg     500 mg Oral 3 times per day 01/23/15 0827 02/06/15 0559   01/22/15 2300  cefTRIAXone (ROCEPHIN) 2 g in dextrose 5 % 50 mL IVPB - Premix     2 g 100 mL/hr over 30 Minutes Intravenous  Once 01/22/15 2241 01/23/15 0058   01/22/15 2245  metroNIDAZOLE (FLAGYL) IVPB 500 mg  Status:  Discontinued     500 mg 100 mL/hr over 60 Minutes Intravenous Every 8 hours 01/22/15 2241 01/23/15 0828      Assessment: Blood cultures growing Enterococcus. ID to continue vancomycin until sensitivities are back.  Vancomycin trough today = 18.  Goal of Therapy:  Vancomycin trough level 15-20 mcg/ml  Plan:  Will continue vancomycin 750 mg iv q 12 h. Pharmacy will continue to monitor.   Elvera MariaSonja  Luciano Cinquemani, PharmD  01/27/2015,10:37 AM

## 2015-01-27 NOTE — Progress Notes (Signed)
Speech Language Pathology Treatment: Dysphagia  Patient Details Name: Odessa FlemingRachel E Grether MRN: [id number] DOB: 07/02/1941 Today's Date: 01/27/2015 Time: 1000-1030 SLP Time Calculation (min) (ACUTE ONLY): 30 min  Assessment / Plan / Recommendation Clinical Impression  Pt appears to safely tolerate trials of thin liquids via cup/straw and purees w/ no overt s/s of aspiration noted; no significant oral phase deficits noted. Encouragement and full feeding assistance required to engage pt in taking po's. She gave a few phonations to y/n questions indicating she did not feel well. Reviewed chart/MD notes. Due to pt's increased risk for aspiration/dysphagia sec. To Cognitive status, continue to rec. a Dys. I w/ thin liquids following aspiration precautions and w/ feeding assistance. Rec. Dietician f/u for potential supplements.     HPI Other Pertinent Information: pt is mostly nonverbal except to say "uh-huh" to questions if she wanted more po's. Pt required feeding and did not attempt to feed self.    Pertinent Vitals Pain Assessment: No/denies pain (no grimaces noted)  SLP Plan  Continue with current plan of care ST will f/u w/ toleration of diet next 1-3 days.   Recommendations Diet recommendations: Dysphagia 1 (puree);Thin liquid Liquids provided via: Straw;Cup Medication Administration: Crushed with puree Supervision: Full supervision/cueing for compensatory strategies Compensations: Slow rate;Small sips/bites Postural Changes and/or Swallow Maneuvers: Seated upright 90 degrees              Oral Care Recommendations: Oral care BID;Oral care before and after PO;Staff/trained caregiver to provide oral care Follow up Recommendations: Skilled Nursing facility Plan: Continue with current plan of care    GO     Watson,Katherine 01/27/2015, 11:11 AM

## 2015-01-27 NOTE — Plan of Care (Addendum)
Problem: Discharge Progression Outcomes Goal: Other Discharge Outcomes/Goals Outcome: Not Progressing Patient not discharged today due to  Temp spike last night, Patient remains nonverbal at baseline, tolerating diet well, remains on IV vanc.  Dr. Sampson GoonFitzgerald to manage antibiotics. Remains on enteric precautions due to + for cdiff, 2 loose stools today.  Temp creeping up as the day went on today as well will continue to monitor may have to cover with tylenol.

## 2015-01-28 MED ORDER — PENICILLIN VK DESENSITIZATION 10,000 UNITS/ML PO
48000.0000 [IU] | Freq: Once | ORAL | Status: DC
  Filled 2015-01-28: qty 4.8

## 2015-01-28 MED ORDER — PENICILLIN VK DESENSITIZATION 1000 UNIT/ML PO
800.0000 [IU] | Freq: Once | ORAL | Status: AC
  Administered 2015-01-29: 800 [IU] via ORAL
  Filled 2015-01-28 (×2): qty 0.8

## 2015-01-28 MED ORDER — PENICILLIN VK DESENSITIZATION 1000 UNIT/ML PO
1600.0000 [IU] | Freq: Once | ORAL | Status: AC
  Administered 2015-01-29: 10:00:00 1600 [IU] via ORAL
  Filled 2015-01-28 (×2): qty 1.6

## 2015-01-28 MED ORDER — PENICILLIN VK DESENSITIZATION 1000 UNIT/ML PO
3200.0000 [IU] | Freq: Once | ORAL | Status: DC
  Filled 2015-01-28: qty 3.2

## 2015-01-28 MED ORDER — DIPHENHYDRAMINE HCL 25 MG PO CAPS: 50.0000 mg | ORAL_CAPSULE | ORAL | Status: AC | PRN

## 2015-01-28 MED ORDER — PENICILLIN VK DESENSITIZATION 80,000 UNITS/ML PO
80000.0000 [IU] | Freq: Once | ORAL | Status: DC
Start: 2015-01-28 — End: 2015-01-28
  Filled 2015-01-28: qty 1

## 2015-01-28 MED ORDER — PENICILLIN VK DESENSITIZATION 80,000 UNITS/ML PO
160000.0000 [IU] | Freq: Once | ORAL | Status: AC
  Administered 2015-01-29: 12:00:00 160000 [IU] via ORAL
  Filled 2015-01-28 (×2): qty 2

## 2015-01-28 MED ORDER — PENICILLIN VK DESENSITIZATION 80,000 UNITS/ML PO
320000.0000 [IU] | Freq: Once | ORAL | Status: AC
  Administered 2015-01-29: 12:00:00 320000 [IU] via ORAL
  Filled 2015-01-28 (×2): qty 4

## 2015-01-28 MED ORDER — PENICILLIN VK DESENSITIZATION 1000 UNIT/ML PO
200.0000 [IU] | Freq: Once | ORAL | Status: AC
  Administered 2015-01-29: 09:00:00 200 [IU] via ORAL
  Filled 2015-01-28 (×2): qty 0.2

## 2015-01-28 MED ORDER — PENICILLIN VK DESENSITIZATION 80,000 UNITS/ML PO
320000.0000 [IU] | Freq: Once | ORAL | Status: DC
  Filled 2015-01-28: qty 4

## 2015-01-28 MED ORDER — PENICILLIN VK DESENSITIZATION 1000 UNIT/ML PO
400.0000 [IU] | Freq: Once | ORAL | Status: DC
  Filled 2015-01-28: qty 0.4

## 2015-01-28 MED ORDER — PENICILLIN VK DESENSITIZATION 10,000 UNITS/ML PO
12000.0000 [IU] | Freq: Once | ORAL | Status: AC
  Administered 2015-01-29: 12000 [IU] via ORAL
  Filled 2015-01-28 (×2): qty 1.2

## 2015-01-28 MED ORDER — PENICILLIN VK DESENSITIZATION 1000 UNIT/ML PO
6400.0000 [IU] | Freq: Once | ORAL | Status: DC
Start: 2015-01-28 — End: 2015-01-28
  Filled 2015-01-28: qty 6.4

## 2015-01-28 MED ORDER — PENICILLIN VK DESENSITIZATION 10,000 UNITS/ML PO
24000.0000 [IU] | Freq: Once | ORAL | Status: AC
  Administered 2015-01-29: 24000 [IU] via ORAL
  Filled 2015-01-28 (×2): qty 2.4

## 2015-01-28 MED ORDER — PENICILLIN VK DESENSITIZATION 1000 UNIT/ML PO
100.0000 [IU] | Freq: Once | ORAL | Status: DC
Start: 2015-01-28 — End: 2015-01-28
  Filled 2015-01-28: qty 0.1

## 2015-01-28 MED ORDER — DIPHENHYDRAMINE HCL 50 MG/ML IJ SOLN
50.0000 mg | INTRAMUSCULAR | Status: AC | PRN
  Filled 2015-01-28: qty 1

## 2015-01-28 MED ORDER — PENICILLIN VK DESENSITIZATION 80,000 UNITS/ML PO
640000.0000 [IU] | Freq: Once | ORAL | Status: DC
  Filled 2015-01-28: qty 8

## 2015-01-28 MED ORDER — PENICILLIN VK DESENSITIZATION 10,000 UNITS/ML PO
24000.0000 [IU] | Freq: Once | ORAL | Status: DC
  Filled 2015-01-28: qty 2.4

## 2015-01-28 MED ORDER — PENICILLIN VK DESENSITIZATION 1000 UNIT/ML PO
1600.0000 [IU] | Freq: Once | ORAL | Status: DC
  Filled 2015-01-28: qty 1.6

## 2015-01-28 MED ORDER — DIPHENHYDRAMINE HCL 50 MG/ML IJ SOLN: 50.0000 mg | INTRAMUSCULAR | Status: AC | PRN

## 2015-01-28 MED ORDER — PENICILLIN VK DESENSITIZATION 80,000 UNITS/ML PO
80000.0000 [IU] | Freq: Once | ORAL | Status: AC
  Administered 2015-01-29: 80000 [IU] via ORAL
  Filled 2015-01-28 (×2): qty 1

## 2015-01-28 MED ORDER — PENICILLIN VK DESENSITIZATION 10,000 UNITS/ML PO
12000.0000 [IU] | Freq: Once | ORAL | Status: DC
  Filled 2015-01-28: qty 1.2

## 2015-01-28 MED ORDER — PENICILLIN VK DESENSITIZATION 1000 UNIT/ML PO
6400.0000 [IU] | Freq: Once | ORAL | Status: AC
  Administered 2015-01-29: 6400 [IU] via ORAL
  Filled 2015-01-28 (×2): qty 6.4

## 2015-01-28 MED ORDER — PENICILLIN VK DESENSITIZATION 1000 UNIT/ML PO
100.0000 [IU] | Freq: Once | ORAL | Status: AC
  Administered 2015-01-29: 09:00:00 100 [IU] via ORAL
  Filled 2015-01-28 (×2): qty 0.1

## 2015-01-28 MED ORDER — PENICILLIN VK DESENSITIZATION 1000 UNIT/ML PO
400.0000 [IU] | Freq: Once | ORAL | Status: AC
  Administered 2015-01-29: 10:00:00 400 [IU] via ORAL
  Filled 2015-01-28 (×2): qty 0.4

## 2015-01-28 MED ORDER — PENICILLIN VK DESENSITIZATION 1000 UNIT/ML PO
3200.0000 [IU] | Freq: Once | ORAL | Status: AC
  Administered 2015-01-29: 10:00:00 3200 [IU] via ORAL
  Filled 2015-01-28 (×2): qty 3.2

## 2015-01-28 MED ORDER — PENICILLIN VK DESENSITIZATION 1000 UNIT/ML PO
800.0000 [IU] | Freq: Once | ORAL | Status: DC
  Filled 2015-01-28: qty 0.8

## 2015-01-28 MED ORDER — PENICILLIN VK DESENSITIZATION 10,000 UNITS/ML PO
48000.0000 [IU] | Freq: Once | ORAL | Status: AC
  Administered 2015-01-29: 48000 [IU] via ORAL
  Filled 2015-01-28 (×2): qty 4.8

## 2015-01-28 MED ORDER — EPINEPHRINE HCL 1 MG/ML IJ SOLN
1.0000 mg | Freq: Once | INTRAMUSCULAR | Status: AC | PRN
  Filled 2015-01-28: qty 1

## 2015-01-28 MED ORDER — PENICILLIN VK DESENSITIZATION 80,000 UNITS/ML PO
640000.0000 [IU] | Freq: Once | ORAL | Status: AC
Start: 2015-01-29 — End: 2015-01-29
  Administered 2015-01-29: 12:00:00 640000 [IU] via ORAL
  Filled 2015-01-28 (×2): qty 8

## 2015-01-28 MED ORDER — PENICILLIN VK DESENSITIZATION 80,000 UNITS/ML PO
160000.0000 [IU] | Freq: Once | ORAL | Status: DC
  Filled 2015-01-28: qty 2

## 2015-01-28 MED ORDER — PENICILLIN VK DESENSITIZATION 1000 UNIT/ML PO
200.0000 [IU] | Freq: Once | ORAL | Status: DC
Start: 2015-01-28 — End: 2015-01-28
  Filled 2015-01-28: qty 0.2

## 2015-01-28 NOTE — Progress Notes (Signed)
[facility] [place] [facility]   PATIENT NAME: Kelli RoysRachel Matthews    MR#:  [id number]  DATE OF BIRTH:  11/23/1940  SUBJECTIVE:  Patient was able to open her mouth to command. She was able to move her arms on her own. Did not answer any of my questions.  REVIEW OF SYSTEMS:  ROS: Unable to obtain, patient's baseline is nonverbal. DRUG ALLERGIES:   Allergies  Allergen Reactions  . Augmentin [Amoxicillin-Pot Clavulanate] Rash  . Latex Rash    VITALS:  Blood pressure 137/61, pulse 89, temperature 98.4 F (36.9 C), temperature source Oral, resp. rate 18, height 5\' 5"  (1.651 m), weight 62.37 kg (137 lb 8 oz), SpO2 98 %.  PHYSICAL EXAMINATION:  Physical Exam  HENT:  Head: Normocephalic and atraumatic.  Eyes: Conjunctivae are normal. Pupils are equal, round, and reactive to light.  Neck: Normal range of motion. Neck supple. No tracheal deviation present. No thyromegaly present.  Cardiovascular: Normal rate and regular rhythm.   Murmur heard.  Systolic murmur is present with a grade of 2/6  RSB  Pulmonary/Chest: Effort normal and breath sounds normal.  Abdominal: Soft. Bowel sounds are normal. There is no hepatosplenomegaly. There is no tenderness.  Musculoskeletal: Normal range of motion.       Right ankle: She exhibits no swelling.       Left ankle: She exhibits no swelling.  Neurological: She is alert.  Skin: Skin is warm and dry.  Psychiatric:  Nonverbal at baseline     LABORATORY PANEL:   CBC  Recent Labs Lab 01/26/15 0628  WBC 13.9*  HGB 9.4*  HCT 28.9*  PLT 360   Chemistries   Recent Labs Lab 01/25/15 0417 01/26/15 0628  NA 139  --   K 3.7 3.9  CL 112*  --   CO2 22  --   GLUCOSE 126*  --   BUN 13  --   CREATININE 0.83  --   CALCIUM 7.8*  --   MG  --  2.2  AST 47*  --   ALT 121*  --   ALKPHOS 90  --   BILITOT 0.2*  --     ASSESSMENT AND PLAN:   *Bacteremia with enterococcus, urinary tract infection - Empiric  vancomycin as per infectious disease. I spoke with microbiologist the enterococcus is sensitive to ampicillin Zyvox and vancomycin and resistant to Levaquin. Patient has an  Augmentin allergy listed in the computer. I will leave choice of antibody up to Dr. Sampson GoonFitzgerald infectious disease specialist. Repeat blood cultures so far negative. The patient had a fever yesterday, so disposition will not be today.  *Clostridium difficile colitis- on Flagyl, as per nurse having 2 bowel movements per shift so for a 24-hour period loose stools.  * left-sided nephrolithiasis-patient is status post left-sided ureteral stent placement about 3 weeks ago. Patient is followed by Dr. Vanna ScotlandAshley Brandon.  * hypokalemia-this is likely secondary to the diarrhea. - repleted and resolved  * abnormal LFTs-this could possibly be related to the underlying infection and diarrhea. -Abdominal ultrasound does not show any evidence of acute hepatobiliary pathology. -LFT's trending down.   * hypertension-presently hemodynamically stable. Continue metoprolol.  * fungal infection-this is located in bilateral groin area. Continue nystatin cream/powder.  * dementia-continue Aricept, Seroquel. -Avoid benzodiazepines.  * depression-continue Celexa.   All the records are reviewed and case discussed with Care Management/Social Workerr. Management plans discussed with the patient, and nursing staff.  I left a message for the  patient's son.  CODE STATUS: DNR  TOTAL TIME TAKING CARE OF THIS PATIENT: 20 minutes.   POSSIBLE D/C IN 1-2 DAYS, DEPENDING ON CLINICAL CONDITION and temperature curve.   Alford Highland M.D on 01/28/2015 at 9:41 AM  Between 7am to 6pm - Pager - [phone]  After 6pm go to [email or web address] - Scientist, research (life sciences) Hospitalists  Office  [phone]

## 2015-01-28 NOTE — Care Management (Signed)
Spoke with Trinna PostAlex, [facility]. States that will assess Ms. Schiavi for re-admission into their facility today. Alex here. Unable to accept [facility] Gordan [facility] into [facility] unless she is ambulating more and is more self-sufficient. Can take back if Ms. Hughley has 24/7 sitters. States she will call Micah NoelLance, son of Ms. Tonkovich, and discuss this disposition. Spoke with Dr. Sampson GoonFitzgerald. Will continue IV  Vancomycin. Will start the desentization process for penicillin.  Will not be ready for discharge until possibly Monday. Gwenette GreetBrenda S Holland RN MSN Care Management [phone]

## 2015-01-28 NOTE — Plan of Care (Addendum)
Problem: Discharge Progression Outcomes Goal: Other Discharge Outcomes/Goals Outcome: Not Progressing Patient remains on IV vancomycin and PO antibiotics, continues to have loose bowel movements throughout the day.  Patient remains mostly non verbal and pleasantly confused which is her baseline.  Patient VSS no spike in temp so fart last night or today will continue to monitor.  Dr. Sampson GoonFitzgerald seen this afternoon and decided ordered pcn desensitization starting in the morning over 4 hours, specific instructions in computer under nursing notes.

## 2015-01-28 NOTE — Progress Notes (Addendum)
ANTIBIOTIC CONSULT NOTE - INITIAL  Pharmacy Consult for PENICILLIN DESENSITIZATION Indication: enterococcal bacteremia  Allergies  Allergen Reactions  . Augmentin [Amoxicillin-Pot Clavulanate] Rash  . Latex Rash    Patient Measurements: Height:  (165.1 cm) Weight: 137 lb 8 oz (62.37 kg) IBW/kg (Calculated) : 57  Vital Signs: Temp: 98.1 F (36.7 C) (06/03 1226) Temp Source: Oral (06/03 1226) BP: 123/80 mmHg (06/03 1226) Pulse Rate: 72 (06/03 1226) Intake/Output from previous day:   Intake/Output from this shift:    Labs:  Recent Labs  01/26/15 0628  WBC 13.9*  HGB 9.4*  PLT 360   Estimated Creatinine Clearance: 54.3 mL/min (by C-G formula based on Cr of 0.83).  Recent Labs  01/25/15 2028 01/27/15 0911  VANCOTROUGH 13 18     Microbiology: Recent Results (from the past 720 hour(s))  C difficile quick scan w PCR reflex [facility])     Status: None   Collection Time: 01/22/15  9:58 PM  Result Value Ref Range Status   C Diff antigen POSITIVE  Final   C Diff toxin NEGATIVE  Final   C Diff interpretation REFLEX TO PCR  Final  Urine culture     Status: None (Preliminary result)   Collection Time: 01/22/15  9:58 PM  Result Value Ref Range Status   Specimen Description URINE, RANDOM  Final   Special Requests Normal  Final   Culture HOLDING FOR POSSIBLE PATHOGEN  Final   Report Status PENDING  Incomplete  Clostridium Difficile by PCR (not at [facility])     Status: Abnormal   Collection Time: 01/22/15  9:58 PM  Result Value Ref Range Status   C difficile by pcr POSITIVE (A) NEGATIVE Final    Comment: CRITICAL RESULT CALLED TO, READ BACK BY AND VERIFIED WITH: LUIS FLORES AT 2336 01/22/15.PMH   Culture, blood (routine x 2)     Status: None   Collection Time: 01/22/15 10:53 PM  Result Value Ref Range Status   Specimen Description BLOOD  Final   Special Requests Normal  Final   Culture  Setup Time   Final    GRAM POSITIVE DIPLOCOCCI AEROBIC BOTTLE ONLY CRITICAL  RESULT CALLED TO, READ BACK BY AND VERIFIED WITH: Acquanetta Sit [facility] AT 2114 01/23/15.PMH    Culture   Final    ENTEROCOCCUS RAFFINOSUS AEROBIC BOTTLE ONLY REFER TO OTHER SET FOR SENSITIVITIES     Report Status 01/27/2015 FINAL  Final  Culture, blood (routine x 2)     Status: None (Preliminary result)   Collection Time: 01/22/15 10:53 PM  Result Value Ref Range Status   Specimen Description BLOOD  Final   Special Requests Normal  Final   Culture  Setup Time   Final    GRAM POSITIVE DIPLOCOCCI IN BOTH AEROBIC AND ANAEROBIC BOTTLES CRITICAL RESULT CALLED TO, READ BACK BY AND VERIFIED WITH: CALLED TO SHAE BREWER ON 01/22/18 AT 1820 BY JEF GRAM POSITIVE DIPLOCOCCI CRITICAL RESULT CALLED TO, READ BACK BY AND VERIFIED WITH: Acquanetta Sit [facility] AT 2114 01/23/15.PMH    Culture   Final    ENTEROCOCCUS RAFFINOSUS IN BOTH AEROBIC AND ANAEROBIC BOTTLES PERFORMING SENSITIVITIES VIA ALTERNATIVE METHOD    Report Status PENDING  Incomplete  Stool culture     Status: None   Collection Time: 01/24/15  2:52 AM  Result Value Ref Range Status   Specimen Description STOOL  Final   Special Requests NONE  Final   Culture   Final    NO SALMONELLA OR SHIGELLA ISOLATED No Pathogenic  E. coli detected NO CAMPYLOBACTER DETECTED    Report Status 01/27/2015 FINAL  Final  Culture, blood (routine x 2)     Status: None (Preliminary result)   Collection Time: 01/25/15  3:20 PM  Result Value Ref Range Status   Specimen Description BLOOD  Final   Special Requests BLOOD  Final   Culture NO GROWTH 3 DAYS  Final   Report Status PENDING  Incomplete  Culture, blood (routine x 2)     Status: None (Preliminary result)   Collection Time: 01/25/15  3:25 PM  Result Value Ref Range Status   Specimen Description BLOOD  Final   Special Requests BLOOD  Final   Culture NO GROWTH 3 DAYS  Final   Report Status PENDING  Incomplete    Medical History: Past Medical History  Diagnosis Date  . Dementia 2012  . Maxillary sinus  fracture     after fall 2013, with LOC  . Hypertension 2001    on medicaitons for at least 10 years  . Other esophagitis 2011  . Personal history of tobacco use, presenting hazards to health 2011  . H/O cystitis 2010  . Arthritis 2005  . Rectal mass 2010  . Alzheimer disease   . Chronic kidney disease   . Anemia   . Alzheimer disease   . Kidney stone   . Hypokalemia   . Hepatic cyst     Medications:  Scheduled:  . antiseptic oral rinse  7 mL Mouth Rinse BID  . aspirin EC  81 mg Oral Daily  . citalopram  20 mg Oral Daily  . donepezil  10 mg Oral QHS  . heparin  5,000 Units Subcutaneous 3 times per day  . memantine  10 mg Oral BID  . metoprolol succinate  25 mg Oral Daily  . metroNIDAZOLE  500 mg Oral 3 times per day  . mirtazapine  30 mg Oral QHS  . nystatin   Topical BID  . penicillin vk  100 Units Oral Once   Followed by  . penicillin vk  200 Units Oral Once   Followed by  . penicillin vk  400 Units Oral Once   Followed by  . penicillin vk  800 Units Oral Once   Followed by  . penicillin vk  1,600 Units Oral Once   Followed by  . penicillin vk  3,200 Units Oral Once   Followed by  . penicillin vk  6,400 Units Oral Once   Followed by  . penicillin vk  12,000 Units Oral Once   Followed by  . penicillin vk  24,000 Units Oral Once   Followed by  . penicillin vk  48,000 Units Oral Once   Followed by  . penicillin vk  80,000 Units Oral Once   Followed by  . penicillin vk  160,000 Units Oral Once   Followed by  . penicillin vk  320,000 Units Oral Once   Followed by  . penicillin vk  640,000 Units Oral Once  . potassium chloride  20 mEq Oral BID  . QUEtiapine  50 mg Oral QHS  . vancomycin  750 mg Intravenous Q12H   Assessment: 74 y.o. female with advanced dementia, living at facility with recent UTI and nephrolithiaisis s/p stenting approx 2 weeks ago admitted 5/29 with fevers, diarrhea and confusion. Found to have wbc 15, and C diff +. Had recently been on  bactrim per HPI for UTI. Since admit wbc has normalized and defervesced. Had been on ceftraixone, IV  vanco and oral flagyl. Bcx with Enterococcus Raffinosus and UCX pending. Amp, vanco, linezolid sensitive Pt has augmentin allergy but unable to elucidate reaction due to dementia. Repeat bcx 5/31 neg to date. Initial culture 5/29 +.Vanco begun 5/29.  Goal of Therapy:  penicillin desensitization, if tolerates can use amoxicillin  Plan:  Per ID consult: continue vancomycin for now, attempt PCN desensitization per protocol (see linked orders above), if tolerates desensitization can use amoxicillin PO 500mg  TID for total 14 day course from 5/29 (date vancomycin initiated).   Will order prn IV and PO diphenhydramine and IM epinephrine for mild to severe reaction to PCN desensitization. Spoke to nursing regarding PCN densensitization and steps on drugs to administer for mild to severe reactions, nurse to notify MD of any signs of allergic reaction.  PCN Desensitization to start tomorrow 6/4 at 0900 per Dr. Sampson GoonFitzgerald.  Kelli Matthews, PharmD  01/28/2015,2:25 PM

## 2015-01-28 NOTE — Plan of Care (Signed)
Problem: Discharge Progression Outcomes Goal: Discharge plan in place and appropriate Individualization:  [facility]      PMH: Dementia/Alzheimers, HTN, CKD, Anemia, Hypokalemia, and nonverbal at baseline - controlled with medications.      High Fall Risk - toileting assistance with hourly rounding Goal: Other Discharge Outcomes/Goals - continues on ABX. - No complaints of pain. -VSS, will continue to monitor.

## 2015-01-28 NOTE — Progress Notes (Signed)
Date of Admission:  01/22/2015     ID: Kelli Matthews is a 74 y.o. female with  UTI and bacteremia  Principal Problem:   C. difficile diarrhea Active Problems:   Hypertension   Dementia with behavioral disturbance   Acute pancreatitis   Hypokalemia   Elevated LFTs   Subjective: No fevers, remains confused  Medications:  . antiseptic oral rinse  7 mL Mouth Rinse BID  . aspirin EC  81 mg Oral Daily  . citalopram  20 mg Oral Daily  . donepezil  10 mg Oral QHS  . heparin  5,000 Units Subcutaneous 3 times per day  . memantine  10 mg Oral BID  . metoprolol succinate  25 mg Oral Daily  . metroNIDAZOLE  500 mg Oral 3 times per day  . mirtazapine  30 mg Oral QHS  . nystatin   Topical BID  . potassium chloride  20 mEq Oral BID  . QUEtiapine  50 mg Oral QHS  . vancomycin  750 mg Intravenous Q12H    Objective: Vital signs in last 24 hours: Temp:  [97.8 F (36.6 C)-100.2 F (37.9 C)] 98.1 F (36.7 C) (06/03 1226) Pulse Rate:  [72-106] 72 (06/03 1226) Resp:  [18-20] 20 (06/03 1226) BP: (123-147)/(52-80) 123/80 mmHg (06/03 1226) SpO2:  [98 %-100 %] 100 % (06/03 1226) Weight:  [62.37 kg (137 lb 8 oz)] 62.37 kg (137 lb 8 oz) (06/03 0500) Constitutional: Basically non verbal during exam, thin, chronically ill appearing HENT: [place]/AT, PERRLA, no scleral icterus Mouth/Throat: Oropharynx is clear and dry. No oropharyngeal exudate.  Cardiovascular: Normal rate, regular rhythm and normal heart sounds. Exam reveals no gallop and no friction rub.  No murmur heard.  Pulmonary/Chest: Effort normal and breath sounds normal. No respiratory distress. has no wheezes.  Neck supple, no nuchal rigidity Abdominal: Soft. Bowel sounds are normal. exhibits no distension. There is no tenderness.  Lymphadenopathy: no cervical adenopathy. No axillary adenopathy Neurological: non verbal Skin: Skin is warm and dry. No rash noted. No erythema.  Psychiatric: non verbal, calm  Lab Results  Recent  Labs  01/26/15 0628  WBC 13.9*  HGB 9.4*  HCT 28.9*  K 3.9   Liver Panel No results for input(s): PROT, ALBUMIN, AST, ALT, ALKPHOS, BILITOT, BILIDIR, IBILI in the last 72 hours.  Microbiology: Results for orders placed or performed during the hospital encounter of 01/22/15  C difficile quick scan w PCR reflex [facility])     Status: None   Collection Time: 01/22/15  9:58 PM  Result Value Ref Range Status   C Diff antigen POSITIVE  Final   C Diff toxin NEGATIVE  Final   C Diff interpretation REFLEX TO PCR  Final  Urine culture     Status: None (Preliminary result)   Collection Time: 01/22/15  9:58 PM  Result Value Ref Range Status   Specimen Description URINE, RANDOM  Final   Special Requests Normal  Final   Culture HOLDING FOR POSSIBLE PATHOGEN  Final   Report Status PENDING  Incomplete  Clostridium Difficile by PCR (not at [facility])     Status: Abnormal   Collection Time: 01/22/15  9:58 PM  Result Value Ref Range Status   C difficile by pcr POSITIVE (A) NEGATIVE Final    Comment: CRITICAL RESULT CALLED TO, READ BACK BY AND VERIFIED WITH: LUIS FLORES AT 2336 01/22/15.PMH   Culture, blood (routine x 2)     Status: None   Collection Time: 01/22/15 10:53 PM  Result  Value Ref Range Status   Specimen Description BLOOD  Final   Special Requests Normal  Final   Culture  Setup Time   Final    GRAM POSITIVE DIPLOCOCCI AEROBIC BOTTLE ONLY CRITICAL RESULT CALLED TO, READ BACK BY AND VERIFIED WITH: Acquanetta Sit/[facility] AT 2114 01/23/15.PMH    Culture   Final    ENTEROCOCCUS RAFFINOSUS AEROBIC BOTTLE ONLY REFER TO OTHER SET FOR SENSITIVITIES     Report Status 01/27/2015 FINAL  Final  Culture, blood (routine x 2)     Status: None (Preliminary result)   Collection Time: 01/22/15 10:53 PM  Result Value Ref Range Status   Specimen Description BLOOD  Final   Special Requests Normal  Final   Culture  Setup Time   Final    GRAM POSITIVE DIPLOCOCCI IN BOTH AEROBIC AND ANAEROBIC BOTTLES CRITICAL  RESULT CALLED TO, READ BACK BY AND VERIFIED WITH: CALLED TO SHAE BREWER ON 01/22/18 AT 1820 BY JEF GRAM POSITIVE DIPLOCOCCI CRITICAL RESULT CALLED TO, READ BACK BY AND VERIFIED WITH: Acquanetta Sit/[facility]KA [facility] AT 2114 01/23/15.PMH    Culture   Final    ENTEROCOCCUS RAFFINOSUS IN BOTH AEROBIC AND ANAEROBIC BOTTLES PERFORMING SENSITIVITIES VIA ALTERNATIVE METHOD    Report Status PENDING  Incomplete  Stool culture     Status: None   Collection Time: 01/24/15  2:52 AM  Result Value Ref Range Status   Specimen Description STOOL  Final   Special Requests NONE  Final   Culture   Final    NO SALMONELLA OR SHIGELLA ISOLATED No Pathogenic E. coli detected NO CAMPYLOBACTER DETECTED    Report Status 01/27/2015 FINAL  Final  Culture, blood (routine x 2)     Status: None (Preliminary result)   Collection Time: 01/25/15  3:20 PM  Result Value Ref Range Status   Specimen Description BLOOD  Final   Special Requests BLOOD  Final   Culture NO GROWTH 3 DAYS  Final   Report Status PENDING  Incomplete  Culture, blood (routine x 2)     Status: None (Preliminary result)   Collection Time: 01/25/15  3:25 PM  Result Value Ref Range Status   Specimen Description BLOOD  Final   Special Requests BLOOD  Final   Culture NO GROWTH 3 DAYS  Final   Report Status PENDING  Incomplete    Studies/Results: No results found.   Assessment/Plan: Kelli FlemingRachel E Matthews is a 74 y.o. female with advanced dementia, living at facility with recent UTI and nephrolithiaisis s/p stenting approx 2 weeks ago admitted 5/29 with fevers, diarrhea and confusion. Found to have wbc 15, and C diff +. Had recently been on bactrim per HPI for UTI. Since admit wbc has normalized and defervesced. Had been on ceftraixone, IV vanco and oral flagyl. Bcx with Enterococcus Raffinosus and UCX  Pending. Amp, vanco linezolid sensitive Has augmentin allergy but unable to elucidate reaction due to demenita Repeat bcx 5/31 neg to date. Initial culture 5/29 +.    Vanco begun 5/29 Day 4 of vancomycin  Recommendations Continue vanco for now.   Stopped ceftriaxone 5/31 Will attempt pcn desensitization per protocol as allergy is listed as a rash. If tolerates pcn then can use amoxicillin 500 tid for total 14 day course from 5/29 initiation of vanco Cont flagyl oral for C diff - will need 10 days after stopping other abx  Alisandra Son   01/28/2015, 1:54 PM

## 2015-01-29 LAB — COMPREHENSIVE METABOLIC PANEL
ALK PHOS: 192 U/L — AB (ref 38–126)
ALT: 149 U/L — ABNORMAL HIGH (ref 14–54)
AST: 200 U/L — ABNORMAL HIGH (ref 15–41)
Albumin: 2.4 g/dL — ABNORMAL LOW (ref 3.5–5.0)
Anion gap: 4 — ABNORMAL LOW (ref 5–15)
BUN: 21 mg/dL — ABNORMAL HIGH (ref 6–20)
CO2: 22 mmol/L (ref 22–32)
Calcium: 8.1 mg/dL — ABNORMAL LOW (ref 8.9–10.3)
Chloride: 108 mmol/L (ref 101–111)
Creatinine, Ser: 1.05 mg/dL — ABNORMAL HIGH (ref 0.44–1.00)
GFR calc Af Amer: 60 mL/min — ABNORMAL LOW (ref >60)
GFR calc non Af Amer: 51 mL/min — ABNORMAL LOW (ref >60)
Glucose, Bld: 136 mg/dL — ABNORMAL HIGH (ref 65–99)
Potassium: 4.9 mmol/L (ref 3.5–5.1)
Sodium: 134 mmol/L — ABNORMAL LOW (ref 135–145)
Total Bilirubin: 0.6 mg/dL (ref 0.3–1.2)
Total Protein: 5.7 g/dL — ABNORMAL LOW (ref 6.5–8.1)

## 2015-01-29 LAB — CBC
HEMATOCRIT: 29.9 % — AB (ref 35.0–47.0)
HEMOGLOBIN: 9.3 g/dL — AB (ref 12.0–16.0)
MCH: 26.3 pg (ref 26.0–34.0)
MCHC: 31.1 g/dL — ABNORMAL LOW (ref 32.0–36.0)
MCV: 84.4 fL (ref 80.0–100.0)
PLATELETS: 512 10*3/uL — AB (ref 150–440)
RBC: 3.55 MIL/uL — ABNORMAL LOW (ref 3.80–5.20)
RDW: 15.3 % — ABNORMAL HIGH (ref 11.5–14.5)
WBC: 17.9 10*3/uL — ABNORMAL HIGH (ref 3.6–11.0)

## 2015-01-29 NOTE — Plan of Care (Signed)
Problem: Discharge Progression Outcomes Goal: Discharge plan in place and appropriate Individualization:  [facility]       PMH: Dementia/Alzheimers, HTN, CKD, Anemia, Hypokalemia, and nonverbal at baseline - controlled with medications.       High Fall Risk - toileting assistance with hourly rounding    Goal: Other Discharge Outcomes/Goals Outcome: Progressing Plan of care progress to goals: Discharge plan- per report patient may not be [facility], patient not following commands as needed and per son patient will need 24[facility] sitters [facility]  Hemodynamics-WBC's 13.9, continue antibiotics per MD order; VSS,  Pain- No complaints of pain. Complications-perineum noted with MASD, barrier cream applied and nystatin powder per order.  Diet- per report patient improving with PO intake Activity- total care patient, non ambulatory, non verbal.

## 2015-01-29 NOTE — Progress Notes (Signed)
Patient ID: Kelli FlemingRachel E Matthews, female   DOB: 04/15/1941, 74 y.o.   MRN: [id number] [facility]  at [facility]   PATIENT NAME: Kelli RoysRachel Matthews    MR#:  [id number]  DATE OF BIRTH:  03/26/1941  SUBJECTIVE:  Patient looks at me. Opens mouth to command. Unable to speak with me today. As per nursing staff 1 bowel movement overnight.  REVIEW OF SYSTEMS:  ROS: Unable to obtain, patient's baseline is nonverbal. DRUG ALLERGIES:   Allergies  Allergen Reactions  . Augmentin [Amoxicillin-Pot Clavulanate] Rash  . Latex Rash    VITALS:  Blood pressure 117/46, pulse 70, temperature 98.4 F (36.9 C), temperature source Oral, resp. rate 16, height 5\' 5"  (1.651 m), weight 62.687 kg (138 lb 3.2 oz), SpO2 98 %.  PHYSICAL EXAMINATION:  Physical Exam  HENT:  Head: Normocephalic and atraumatic.  Eyes: Conjunctivae are normal. Pupils are equal, round, and reactive to light.  Neck: Normal range of motion. Neck supple. No tracheal deviation present. No thyromegaly present.  Cardiovascular: Normal rate and regular rhythm.   Murmur heard.  Systolic murmur is present with a grade of 2/6  RSB  Pulmonary/Chest: Effort normal and breath sounds normal.  Abdominal: Soft. Bowel sounds are normal. There is no hepatosplenomegaly. There is no tenderness.  Musculoskeletal: Normal range of motion.       Right ankle: She exhibits no swelling.       Left ankle: She exhibits no swelling.  Neurological: She is alert.  Skin: Skin is warm and dry.  Psychiatric:  Nonverbal at baseline     LABORATORY PANEL:   CBC  Recent Labs Lab 01/29/15 0458  WBC 17.9*  HGB 9.3*  HCT 29.9*  PLT 512*   Chemistries   Recent Labs Lab 01/26/15 0628 01/29/15 0458  NA  --  134*  K 3.9 4.9  CL  --  108  CO2  --  22  GLUCOSE  --  136*  BUN  --  21*  CREATININE  --  1.05*  CALCIUM  --  8.1*  MG 2.2  --   AST  --  200*  ALT  --  149*  ALKPHOS  --  192*  BILITOT  --  0.6    ASSESSMENT  AND PLAN:   *Bacteremia with enterococcus, urinary tract infection  - Empiric vancomycin as per infectious disease. - Dr. Sampson GoonFitzgerald ordered desensitization of penicillin. A VK penicillin protocol ordered. If there is no reaction to the desensitization protocol, then the patient can go back to her facility on by mouth Augmentin.  *Clostridium difficile colitis- on Flagyl, will need 2 weeks of Flagyl after Augmentin stopped. One bowel movement overnight.  * left-sided nephrolithiasis-patient is status post left-sided ureteral stent placement about 3 weeks ago. Patient is followed by Dr. Vanna ScotlandAshley Brandon.  * hypokalemia-this is likely secondary to the diarrhea. - repleted and resolved  * abnormal LFTs-this could possibly be related to the underlying infection and diarrhea. -Abdominal ultrasound does not show any evidence of acute hepatobiliary pathology. - LFTs up again this morning.  * hypertension-presently hemodynamically stable. Continue metoprolol.  * fungal infection-this is located in bilateral groin area. Continue nystatin cream/powder.  * dementia-continue Aricept, Seroquel. -Avoid benzodiazepines.  * depression-continue Celexa.   All the records are reviewed and case discussed with Care Management/Social Workerr. Management plans discussed with the patient, and nursing staff.  I spoke with the patient's son today.  CODE STATUS: DNR  TOTAL TIME TAKING CARE OF THIS  PATIENT: 20 minutes.   POSSIBLE D/C IN 1-2 DAYS, DEPENDING ON CLINICAL CONDITION and temperature curve.   Alford Highland M.D on 01/29/2015 at 11:16 AM  Between 7am to 6pm - Pager - [phone]  After 6pm go to [email or web address] - Scientist, research (life sciences) Hospitalists  Office  [phone]

## 2015-01-29 NOTE — Plan of Care (Signed)
Problem: Discharge Progression Outcomes Goal: Discharge plan in place and appropriate Individualization:  Individualization:   [facility]        PMH: Dementia/Alzheimers, HTN, CKD, Anemia, Hypokalemia, and nonverbal at baseline - controlled with medications.        High Fall Risk - toileting assistance with hourly rounding Goal: Other Discharge Outcomes/Goals Plan of care progress to goals: Discharge plan- per report [facility] may not be [facility], patient not following commands as needed and per son patient will need 24hours sitters [facility]   Hemodynamics-WBC's 17.9, continue antibiotics per MD order; VSS,   Pain- No complaints of pain. Complications-perineum noted with MASD, barrier cream applied and nystatin powder per order.   Diet- per report patient improving with PO intake; continues to be a feeder Activity- total care patient, non ambulatory, non verbal.

## 2015-01-30 LAB — CULTURE, BLOOD (ROUTINE X 2)
CULTURE: NO GROWTH
Culture: NO GROWTH

## 2015-01-30 MED ORDER — AMOXICILLIN-POT CLAVULANATE 875-125 MG PO TABS
1.0000 | ORAL_TABLET | Freq: Two times a day (BID) | ORAL | Status: DC
  Administered 2015-01-30 – 2015-01-31 (×3): 1 via ORAL
  Filled 2015-01-30 (×3): qty 1

## 2015-01-30 MED ORDER — NYSTATIN 100000 UNIT/GM EX CREA
TOPICAL_CREAM | Freq: Two times a day (BID) | CUTANEOUS | Status: DC
  Administered 2015-01-30 – 2015-02-01 (×5): via TOPICAL
  Administered 2015-02-01: 1 via TOPICAL
  Administered 2015-02-02 – 2015-02-03 (×3): via TOPICAL
  Administered 2015-02-03: 1 via TOPICAL
  Administered 2015-02-04: 11:00:00 via TOPICAL
  Filled 2015-01-30 (×5): qty 15

## 2015-01-30 NOTE — Plan of Care (Signed)
Problem: Discharge Progression Outcomes Goal: Discharge plan in place and appropriate Individualization:  Individualization:    [facility]         PMH: Dementia/Alzheimers, HTN, CKD, Anemia, Hypokalemia, and nonverbal at baseline - controlled with medications.         High Fall Risk - toileting assistance with hourly rounding

## 2015-01-30 NOTE — Plan of Care (Addendum)
Problem: Discharge Progression Outcomes Goal: Discharge plan in place and appropriate Individualization:  [facility]         PMH: Dementia/Alzheimers, HTN, CKD, Anemia, Hypokalemia, and nonverbal at baseline - controlled with medications.         High Fall Risk - toileting assistance with hourly rounding    Goal: Other Discharge Outcomes/Goals Outcome: Progressing Plan of care progress to goals: Discharge plan- per report [facility] be [facility], patient not following commands as needed and per son patient will need 24hours sitters [facility]  Hemodynamics-WBC's 17.9, continue antibiotics per MD order; VSS, no allergic reactions noted this shift.  Pain- No complaints of pain.  Complications-perineum noted with MASD, nystatin powder per order with frequent incontinent checks with safety rounds and turns. Dr. Clint GuyHower notified of BL groin/perineum red, raw, excoriated and patient continues to have multiple loose to watery greenish diarhhea each shift; new order obtained for Rectal tube to decrease skin breakdown.      Diet- per report patient improving with PO intake; total care-needs to have meals fed Activity- total care patient, non ambulatory, non verbal.

## 2015-01-30 NOTE — Progress Notes (Signed)
Patient ID: Kelli Matthews, female   DOB: [birth year], 74 y.o.   MRN: [id number]  [facility] - [place] at [facility]   PATIENT NAME: Kelli Matthews    MR#:  [id number]  DATE OF BIRTH:  July 20, 1941  SUBJECTIVE:  Patient looks at me. Opens mouth to command. Unable to speak with me today. Rectal tube placed by nursing staff secondary to increased watery diarrhea.  REVIEW OF SYSTEMS:  ROS: Unable to obtain, patient's baseline is nonverbal. DRUG ALLERGIES:   Allergies  Allergen Reactions  . Augmentin [Amoxicillin-Pot Clavulanate] Rash  . Latex Rash    VITALS:  Blood pressure 138/55, pulse 82, temperature 98.3 F (36.8 C), temperature source Oral, resp. rate 20, height  (1.651 m), weight 59.33 kg (130 lb 12.8 oz), SpO2 100 %.  PHYSICAL EXAMINATION:  Physical Exam  HENT:  Head: Normocephalic and atraumatic.  Eyes: Conjunctivae are normal. Pupils are equal, round, and reactive to light.  Neck: Normal range of motion. Neck supple. No tracheal deviation present. No thyromegaly present.  Cardiovascular: Normal rate and regular rhythm.   Murmur heard.  Systolic murmur is present with a grade of 2/6  RSB  Pulmonary/Chest: Effort normal and breath sounds normal.  Abdominal: Soft. Bowel sounds are normal. There is no hepatosplenomegaly. There is no tenderness.  Musculoskeletal: Normal range of motion.       Right ankle: She exhibits no swelling.       Left ankle: She exhibits no swelling.  Neurological: She is alert.  Skin: Skin is warm and dry.  Numerous excoriations in the groin bilaterally, erythematous skin.  Psychiatric:  Nonverbal at baseline     LABORATORY PANEL:   CBC  Recent Labs Lab 01/29/15 0458  WBC 17.9*  HGB 9.3*  HCT 29.9*  PLT 512*   Chemistries   Recent Labs Lab 01/26/15 0628 01/29/15 0458  NA  --  134*  K 3.9 4.9  CL  --  108  CO2  --  22  GLUCOSE  --  136*  BUN  --  21*  CREATININE  --  1.05*  CALCIUM  --  8.1*   MG 2.2  --   AST  --  200*  ALT  --  149*  ALKPHOS  --  192*  BILITOT  --  0.6    ASSESSMENT AND PLAN:   *Bacteremia with enterococcus, urinary tract infection- -Patient was desensitized to penicillin. I will DC IV vancomycin. Start by mouth Augmentin.  *Clostridium difficile colitis- on Flagyl, will need 2 weeks of Flagyl after Augmentin stopped. Nursing staff placed rectal tube secondary to increased watery diarrhea. Patient will not be able to go back to the facility with a rectal tube. Will watch at this point.  * left-sided nephrolithiasis-patient is status post left-sided ureteral stent placement about 3 weeks ago. Patient is followed by Dr. Vanna Scotland.  * hypokalemia-this is likely secondary to the diarrhea. - repleted and resolved  * abnormal LFTs-this could possibly be related to the underlying infection and diarrhea. -Abdominal ultrasound does not show any evidence of acute hepatobiliary pathology. - Repeat liver function test in a.m.  * hypertension-presently hemodynamically stable. Continue metoprolol.  * fungal infection-this is located in bilateral groin area. Continue nystatin cream.  * dementia-continue Aricept, Seroquel. -Avoid benzodiazepines.  * depression-continue Celexa.  Management plans discussed with the patient, and nursing staff.  CODE STATUS: DNR  TOTAL TIME TAKING CARE OF THIS PATIENT: 20 minutes.   POSSIBLE D/C once diarrhea  slows down and rectal tube removed.   Alford HighlandWIETING, Dellia Donnelly M.D on 01/30/2015 at 1:09 PM  Between 7am to 6pm - Pager - [phone]  After 6pm go to [email or web address] - Scientist, research (life sciences)password EPAS [facility]  [facility] Hospitalists  Office  [phone]

## 2015-01-30 NOTE — Progress Notes (Signed)
Dr. Clint GuyHower notified of patients BL groin/perineum red, raw, excoriated and continues to have loose to watery green stools multiple times/shift. Verbal order obtained for Rectal tube to help with healing of excoriated area.

## 2015-01-30 NOTE — Progress Notes (Signed)
Rectal tube order obtained from Dr. Clint GuyHower due to patient continues to have multiple loose to water stools each shift and BL groin/perineum is red, raw, and excoriated. Lubricated rectal tube inserted without difficulty with immediate green watery stool noted in tube, 45ml saline instilled in balloon and attached to foot of bed.

## 2015-01-30 NOTE — Plan of Care (Signed)
Problem: Discharge Progression Outcomes Goal: Other Discharge Outcomes/Goals Plan of care progress to goals: Discharge plan- per report [facility], patient not following commands as needed and per son patient will need 24hours sitters [facility]    Hemodynamics-WBC's 17.9; IV Vancomycin discontinued by MD; started on PO Augmentin this afternoon; VSS,    Pain- No complaints of pain. Complications-perineum noted with MASD, Nystatin cream new medication order for pt this shift; rectal tube remains in place    Diet- feeder with good PO intake  Activity- total care patient, non ambulatory, non verbal.

## 2015-01-31 ENCOUNTER — Telehealth: Payer: Self-pay | Admitting: *Deleted

## 2015-01-31 ENCOUNTER — Other Ambulatory Visit: Payer: Self-pay | Admitting: *Deleted

## 2015-01-31 DIAGNOSIS — N390 Urinary tract infection, site not specified: Secondary | ICD-10-CM

## 2015-01-31 LAB — DIFFERENTIAL
BASOS PCT: 1 % (ref 0–1)
Band Neutrophils: 1 % (ref 0–10)
Blasts: 0 %
Eosinophils Relative: 2 % (ref 0–5)
LYMPHS PCT: 5 % — AB (ref 12–46)
Metamyelocytes Relative: 0 %
Monocytes Relative: 10 % (ref 3–12)
Myelocytes: 0 %
Neutrophils Relative %: 81 % — ABNORMAL HIGH (ref 43–77)
Other: 0 %
Promyelocytes Absolute: 0 %
nRBC: 0 /100 WBC

## 2015-01-31 LAB — COMPREHENSIVE METABOLIC PANEL WITH GFR
ALT: 108 U/L — ABNORMAL HIGH (ref 14–54)
AST: 90 U/L — ABNORMAL HIGH (ref 15–41)
Albumin: 2.6 g/dL — ABNORMAL LOW (ref 3.5–5.0)
Alkaline Phosphatase: 146 U/L — ABNORMAL HIGH (ref 38–126)
Anion gap: 6 (ref 5–15)
BUN: 28 mg/dL — ABNORMAL HIGH (ref 6–20)
CO2: 20 mmol/L — ABNORMAL LOW (ref 22–32)
Calcium: 8.3 mg/dL — ABNORMAL LOW (ref 8.9–10.3)
Chloride: 109 mmol/L (ref 101–111)
Creatinine, Ser: 1.1 mg/dL — ABNORMAL HIGH (ref 0.44–1.00)
GFR calc Af Amer: 56 mL/min — ABNORMAL LOW
GFR calc non Af Amer: 49 mL/min — ABNORMAL LOW
Glucose, Bld: 131 mg/dL — ABNORMAL HIGH (ref 65–99)
Potassium: 4.5 mmol/L (ref 3.5–5.1)
Sodium: 135 mmol/L (ref 135–145)
Total Bilirubin: 0.4 mg/dL (ref 0.3–1.2)
Total Protein: 6.2 g/dL — ABNORMAL LOW (ref 6.5–8.1)

## 2015-01-31 LAB — URINE CULTURE: SPECIAL REQUESTS: NORMAL

## 2015-01-31 LAB — CBC
HCT: 30.2 % — ABNORMAL LOW (ref 35.0–47.0)
Hemoglobin: 9.6 g/dL — ABNORMAL LOW (ref 12.0–16.0)
MCH: 26.6 pg (ref 26.0–34.0)
MCHC: 31.8 g/dL — ABNORMAL LOW (ref 32.0–36.0)
MCV: 83.7 fL (ref 80.0–100.0)
Platelets: 627 10*3/uL — ABNORMAL HIGH (ref 150–440)
RBC: 3.61 MIL/uL — AB (ref 3.80–5.20)
RDW: 15.6 % — ABNORMAL HIGH (ref 11.5–14.5)
WBC: 21.7 10*3/uL — AB (ref 3.6–11.0)

## 2015-01-31 MED ORDER — AMOXICILLIN 500 MG PO CAPS
500.0000 mg | ORAL_CAPSULE | Freq: Three times a day (TID) | ORAL | Status: DC
  Administered 2015-01-31 – 2015-02-04 (×12): 500 mg via ORAL
  Filled 2015-01-31 (×13): qty 1

## 2015-01-31 MED ORDER — VANCOMYCIN 50 MG/ML ORAL SOLUTION
250.0000 mg | Freq: Four times a day (QID) | ORAL | Status: DC
  Administered 2015-01-31 – 2015-02-04 (×14): 250 mg via ORAL
  Filled 2015-01-31 (×26): qty 5

## 2015-01-31 MED ORDER — CIPROFLOXACIN HCL 250 MG PO TABS: 250.0000 mg | ORAL_TABLET | Freq: Two times a day (BID) | ORAL | Status: DC

## 2015-01-31 NOTE — Telephone Encounter (Signed)
Tried to contact patient to let her know Carollee HerterShannon wants her to take Cipro 250mg  bid until her surgery on the 20th. The medication has been ordered.

## 2015-01-31 NOTE — Progress Notes (Signed)
Date of Admission:  01/22/2015     ID: Kelli Matthews is a 74 y.o. female with  UTI and bacteremia  Principal Problem:   C. difficile diarrhea Active Problems:   Hypertension   Dementia with behavioral disturbance   Acute pancreatitis   Hypokalemia   Elevated LFTs   Subjective: Remains quite weak - not really verbal. No fevers  Medications:  . amoxicillin-clavulanate  1 tablet Oral Q12H  . antiseptic oral rinse  7 mL Mouth Rinse BID  . aspirin EC  81 mg Oral Daily  . citalopram  20 mg Oral Daily  . donepezil  10 mg Oral QHS  . heparin  5,000 Units Subcutaneous 3 times per day  . memantine  10 mg Oral BID  . metoprolol succinate  25 mg Oral Daily  . metroNIDAZOLE  500 mg Oral 3 times per day  . mirtazapine  30 mg Oral QHS  . nystatin cream   Topical BID  . potassium chloride  20 mEq Oral BID  . QUEtiapine  50 mg Oral QHS    Objective: Vital signs in last 24 hours: Temp:  [97.6 F (36.4 C)-99.8 F (37.7 C)] 99.8 F (37.7 C) (06/06 1353) Pulse Rate:  [60-103] 88 (06/06 1353) Resp:  [18] 18 (06/06 1353) BP: (123-148)/(48-100) 133/48 mmHg (06/06 1353) SpO2:  [93 %-98 %] 97 % (06/06 1353) Weight:  [57.425 kg (126 lb 9.6 oz)] 57.425 kg (126 lb 9.6 oz) (06/06 0507) Constitutional: Basically non verbal during exam, thin, chronically ill appearing HENT: [place]/AT, PERRLA, no scleral icterus Mouth/Throat: Oropharynx is clear and dry. No oropharyngeal exudate.  Cardiovascular: Normal rate, regular rhythm and normal heart sounds. Exam reveals no gallop and no friction rub.  No murmur heard.  Pulmonary/Chest: Effort normal and breath sounds normal. No respiratory distress. has no wheezes.  Neck supple, no nuchal rigidity Abdominal: Soft. Bowel sounds are normal. exhibits no distension. There is no tenderness.  Lymphadenopathy: no cervical adenopathy. No axillary adenopathy Neurological: non verbal Skin: Skin is warm and dry. No rash noted. No erythema.  Psychiatric: non  verbal, calm  Lab Results  Recent Labs  01/29/15 0458 01/31/15 0444  WBC 17.9* 21.7*  HGB 9.3* 9.6*  HCT 29.9* 30.2*  NA 134* 135  K 4.9 4.5  CL 108 109  CO2 22 20*  BUN 21* 28*  CREATININE 1.05* 1.10*   Liver Panel  Recent Labs  01/29/15 0458 01/31/15 0444  PROT 5.7* 6.2*  ALBUMIN 2.4* 2.6*  AST 200* 90*  ALT 149* 108*  ALKPHOS 192* 146*  BILITOT 0.6 0.4    Microbiology: Results for orders placed or performed during the hospital encounter of 01/22/15  C difficile quick scan w PCR reflex [facility])     Status: None   Collection Time: 01/22/15  9:58 PM  Result Value Ref Range Status   C Diff antigen POSITIVE  Final   C Diff toxin NEGATIVE  Final   C Diff interpretation REFLEX TO PCR  Final  Urine culture     Status: None (Preliminary result)   Collection Time: 01/22/15  9:58 PM  Result Value Ref Range Status   Specimen Description URINE, RANDOM  Final   Special Requests Normal  Final   Culture HOLDING FOR POSSIBLE PATHOGEN  Final   Report Status PENDING  Incomplete  Clostridium Difficile by PCR (not at [facility])     Status: Abnormal   Collection Time: 01/22/15  9:58 PM  Result Value Ref Range Status  C difficile by pcr POSITIVE (A) NEGATIVE Final    Comment: CRITICAL RESULT CALLED TO, READ BACK BY AND VERIFIED WITH: LUIS FLORES AT 2336 01/22/15.PMH   Culture, blood (routine x 2)     Status: None   Collection Time: 01/22/15 10:53 PM  Result Value Ref Range Status   Specimen Description BLOOD  Final   Special Requests Normal  Final   Culture  Setup Time   Final    GRAM POSITIVE DIPLOCOCCI AEROBIC BOTTLE ONLY CRITICAL RESULT CALLED TO, READ BACK BY AND VERIFIED WITH: Acquanetta Sit [facility] AT 2114 01/23/15.PMH    Culture   Final    ENTEROCOCCUS RAFFINOSUS AEROBIC BOTTLE ONLY REFER TO OTHER SET FOR SENSITIVITIES     Report Status 01/27/2015 FINAL  Final  Culture, blood (routine x 2)     Status: None (Preliminary result)   Collection Time: 01/22/15 10:53 PM  Result  Value Ref Range Status   Specimen Description BLOOD  Final   Special Requests Normal  Final   Culture  Setup Time   Final    GRAM POSITIVE DIPLOCOCCI IN BOTH AEROBIC AND ANAEROBIC BOTTLES CRITICAL RESULT CALLED TO, READ BACK BY AND VERIFIED WITH: CALLED TO SHAE BREWER ON 01/22/18 AT 1820 BY JEF GRAM POSITIVE DIPLOCOCCI CRITICAL RESULT CALLED TO, READ BACK BY AND VERIFIED WITH: Acquanetta Sit [facility] AT 2114 01/23/15.PMH    Culture   Final    ENTEROCOCCUS RAFFINOSUS IN BOTH AEROBIC AND ANAEROBIC BOTTLES PERFORMING SENSITIVITIES VIA ALTERNATIVE METHOD    Report Status PENDING  Incomplete  Stool culture     Status: None   Collection Time: 01/24/15  2:52 AM  Result Value Ref Range Status   Specimen Description STOOL  Final   Special Requests NONE  Final   Culture   Final    NO SALMONELLA OR SHIGELLA ISOLATED No Pathogenic E. coli detected NO CAMPYLOBACTER DETECTED    Report Status 01/27/2015 FINAL  Final  Culture, blood (routine x 2)     Status: None   Collection Time: 01/25/15  3:20 PM  Result Value Ref Range Status   Specimen Description BLOOD  Final   Special Requests BLOOD  Final   Culture NO GROWTH 5 DAYS  Final   Report Status 01/30/2015 FINAL  Final  Culture, blood (routine x 2)     Status: None   Collection Time: 01/25/15  3:25 PM  Result Value Ref Range Status   Specimen Description BLOOD  Final   Special Requests BLOOD  Final   Culture NO GROWTH 5 DAYS  Final   Report Status 01/30/2015 FINAL  Final    Studies/Results: No results found.   Assessment/Plan: Kelli Matthews is a 74 y.o. female with advanced dementia, living at facility with recent UTI and nephrolithiaisis s/p stenting approx 2 weeks prior to admission admitted 5/29 with fevers, diarrhea and confusion. Found to have wbc 15, and C diff +. Had recently been on bactrim per HPI for UTI. Since admit wbc had normalized and defervesced. Was treated initially with  ceftraixone, IV vanco and oral flagyl. Bcx with  Enterococcus Raffinosus which was Amp, vanco linezolid sensitive.  UCX also with Enterococcus raffinosuss (confrimed with micro lab). Repeat bcx 5/31 neg to date. Initial culture 5/29 +.   Has augmentin allergy but has tolerated PCN desens and was on Augmentin but wbc now up to 21 again and diarrhea persists.  Recommendations Given potentially worsening diarrhea I have changed Augmentin to amoxicillin 500 tid for total 14 day course from  5/29 initiation of vanco Change flagyl to oral vanco  for C diff - will need 10 days after stopping other abx If leukocytosis persists would CT her abd pelvis Justn Quale   01/31/2015, 2:03 PM

## 2015-01-31 NOTE — Progress Notes (Signed)
Patient ID: SCOTT FIX, female   DOB: 20-Oct-1940, 74 y.o.   MRN: [id number] [facility] [place] [facility]   PATIENT NAME: Bertine Schlottman    MR#:  [id number]  DATE OF BIRTH:  February 13, 1941  SUBJECTIVE:  Still having significant diarrhea. WBC count up to 21 today.  Non-verbal at baseline.  Tolerating PO well.    REVIEW OF SYSTEMS:  Review of Systems  Unable to perform ROS due to advanced dementia & non-verbal state.  DRUG ALLERGIES:   Allergies  Allergen Reactions  . Augmentin [Amoxicillin-Pot Clavulanate] Rash  . Latex Rash    VITALS:  Blood pressure 132/57, pulse 60, temperature 98.9 F (37.2 C), temperature source Oral, resp. rate 18, height  (1.651 m), weight 57.425 kg (126 lb 9.6 oz), SpO2 93 %.  PHYSICAL EXAMINATION:  Physical Exam  HENT:  Head: Normocephalic and atraumatic.  Eyes: Conjunctivae are normal. Pupils are equal, round, and reactive to light.  Neck: Normal range of motion. Neck supple. No tracheal deviation present. No thyromegaly present.  Cardiovascular: Normal rate and regular rhythm.   Murmur heard.  Systolic murmur is present with a grade of 2/6  RSB  Pulmonary/Chest: Effort normal and breath sounds normal.  Abdominal: Soft. Bowel sounds are normal. There is no hepatosplenomegaly. There is no tenderness.  Genitourinary:  Rectal tube in place w/ watery diarrhea in bag  Musculoskeletal: Normal range of motion.       Right ankle: She exhibits no swelling.       Left ankle: She exhibits no swelling.  Neurological: She is alert. No cranial nerve deficit. Coordination normal.  Globally weak.   Skin: Skin is warm and dry.  Psychiatric:  Nonverbal at baseline     LABORATORY PANEL:   CBC  Recent Labs Lab 01/31/15 0444  WBC 21.7*  HGB 9.6*  HCT 30.2*  PLT 627*   Chemistries   Recent Labs Lab 01/26/15 0628  01/31/15 0444  NA  --   < > 135  K 3.9  < > 4.5  CL  --   < > 109  CO2  --   < > 20*  GLUCOSE   --   < > 131*  BUN  --   < > 28*  CREATININE  --   < > 1.10*  CALCIUM  --   < > 8.3*  MG 2.2  --   --   AST  --   < > 90*  ALT  --   < > 108*  ALKPHOS  --   < > 146*  BILITOT  --   < > 0.4  < > = values in this interval not displayed.  ASSESSMENT AND PLAN:   * Bacteremia with enterococcus due to urinary tract infection  - was on Vanco and then as per ID desensitized with penicillin. A VK penicillin protocol ordered.  - now on Oral Augmentin as per ID.   *Clostridium difficile colitis - on Flagyl, will need 2 weeks of Flagyl after Augmentin stopped. - still having watery stools and s/p rectal tube yesterday. Will repeat C. Diff toxin assay today.  - tolerating PO well.    * left-sided nephrolithiasis-patient is status post left-sided ureteral stent placement about 3 weeks ago. No abdominal pain, N/V.  - Cr. Stable. Patient is followed by Dr. Vanna Scotland.  * hypokalemia - this is likely secondary to the diarrhea. - repleted and resolved  * abnormal LFTs-this could possibly be related  to the underlying infection and diarrhea. -Abdominal ultrasound does not show any evidence of acute hepatobiliary pathology. - LFT's trending down and will monitor.   * hypertension-presently hemodynamically stable. Continue metoprolol.  * fungal infection-this is located in bilateral groin area. Continue nystatin cream/powder.  * dementia-continue Aricept, Seroquel. -Avoid benzodiazepines.  * depression-continue Celexa.  * Leukocytosis - ?? Related to c. Diff colitis.  Hemodynamically stable.  - follow WBC count.    All the records are reviewed and case discussed with Care Management/Social Workerr. Management plans discussed with the patient, and nursing staff.  I spoke with the patient's son today.  CODE STATUS: DNR  DVT Prophylaxis:  Heparin SQ.   TOTAL TIME TAKING CARE OF THIS PATIENT: 25 minutes.   POSSIBLE D/C IN 1-2 DAYS, DEPENDING ON CLINICAL CONDITION.  Likely needs SNF as  per PT eval and CM working on placement.    [place] SirenSAINANI,Larrie Lucia J M.D on 01/31/2015 at 9:19 AM  Between 7am to 6pm - Pager - [phone]  After 6pm go to [email or web address] - Scientist, research (life sciences)password EPAS [facility]  [facility]  Office  [phone]

## 2015-01-31 NOTE — Plan of Care (Signed)
Problem: Discharge Progression Outcomes Goal: Other Discharge Outcomes/Goals Outcome: Progressing Plan of care with progression to goal:  pts wbc count  Cont to be elevated.  Rectal tube in place. Dr fitzgerald in to see pt this pm and modified  And changed pts abx. Will  Have labs drawn in am  And depending on the results pt may have ct abd per id md.  Does not verbally speak but will shake or nod head.  tol meds well.

## 2015-01-31 NOTE — Telephone Encounter (Signed)
Patient has been on the appropriate antibiotics in the hospital.  She does not need the Cipro.

## 2015-01-31 NOTE — Telephone Encounter (Signed)
Patient's son her POA called back to let me know patient is still in the hospital and has been there since last Saturday (28)? And states Dr. Apolinar JunesBrandon has seen her. He doesn't know how to get the Cipro and give it to her with her being in the hospital. He states all of her meds have been going through the nursing home.

## 2015-01-31 NOTE — Plan of Care (Signed)
Problem: Discharge Progression Outcomes Goal: Other Discharge Outcomes/Goals Outcome: Progressing Plan of care progress to goals: Discharge plan- per report [facility], patient not following commands as needed and per son patient will need 24hours sitters [facility].     Hemodynamics- elevated WBC's, PO antibiotic started 01/30/2015, VSS.     Pain- No complaints of pain. Complications-perineum noted with MASD, Nystatin cream scheduled; rectal tube remains in place     Diet- feeder with good PO intake, no c/o nausea or vomiting. Activity- total care patient, non ambulatory, non verbal.

## 2015-01-31 NOTE — Clinical Social Work Note (Signed)
CSW spoke to [facility] to inquire about pt's ability to return to the facility.  Ms. Kelli Matthews explained that pt walked, without assistance, at baseline and is able to return to the facility with a sitter or upon completing STR.    CSW spoke to pt's son via phone to discuss discharge plans.  CSW discussed the option of returning to ALF with a sitter or going to SNF (for STR or LTC).  CSW explained that the [facility] would review pt's records and determine whether or not they will authorized STR. Son is in agreement to initiating a SNF bed search.  CSW initiated bed search, will extend bed offers once received and will continue to assist with discharge planning.    SurryLynn Zariah Matthews, [place] [phone]

## 2015-02-01 ENCOUNTER — Telehealth: Payer: Self-pay | Admitting: Urology

## 2015-02-01 LAB — COMPREHENSIVE METABOLIC PANEL
ALK PHOS: 124 U/L (ref 38–126)
ALT: 84 U/L — AB (ref 14–54)
ANION GAP: 6 (ref 5–15)
AST: 49 U/L — ABNORMAL HIGH (ref 15–41)
Albumin: 2.7 g/dL — ABNORMAL LOW (ref 3.5–5.0)
BUN: 31 mg/dL — ABNORMAL HIGH (ref 6–20)
CHLORIDE: 111 mmol/L (ref 101–111)
CO2: 20 mmol/L — ABNORMAL LOW (ref 22–32)
Calcium: 8.5 mg/dL — ABNORMAL LOW (ref 8.9–10.3)
Creatinine, Ser: 1.15 mg/dL — ABNORMAL HIGH (ref 0.44–1.00)
GFR calc non Af Amer: 46 mL/min — ABNORMAL LOW (ref >60)
GFR, EST AFRICAN AMERICAN: 53 mL/min — AB (ref >60)
Glucose, Bld: 114 mg/dL — ABNORMAL HIGH (ref 65–99)
POTASSIUM: 4.5 mmol/L (ref 3.5–5.1)
SODIUM: 137 mmol/L (ref 135–145)
TOTAL PROTEIN: 6.1 g/dL — AB (ref 6.5–8.1)
Total Bilirubin: 0.5 mg/dL (ref 0.3–1.2)

## 2015-02-01 LAB — CBC
HEMATOCRIT: 28.8 % — AB (ref 35.0–47.0)
HEMOGLOBIN: 9.4 g/dL — AB (ref 12.0–16.0)
MCH: 27.2 pg (ref 26.0–34.0)
MCHC: 32.8 g/dL (ref 32.0–36.0)
MCV: 83.1 fL (ref 80.0–100.0)
Platelets: 760 10*3/uL — ABNORMAL HIGH (ref 150–440)
RBC: 3.47 MIL/uL — AB (ref 3.80–5.20)
RDW: 15.7 % — ABNORMAL HIGH (ref 11.5–14.5)
WBC: 18.3 10*3/uL — ABNORMAL HIGH (ref 3.6–11.0)

## 2015-02-01 MED ORDER — ENSURE ENLIVE PO LIQD
237.0000 mL | Freq: Three times a day (TID) | ORAL | Status: DC
  Administered 2015-02-01 – 2015-02-04 (×9): 237 mL via ORAL

## 2015-02-01 NOTE — Progress Notes (Signed)
Patient ID: Kelli Matthews, female   DOB: 1941/08/13, 74 y.o.   MRN: [id number]  [facility] [place] at [facility]  PATIENT NAME: Kelli Matthews    MR#:  [id number]  DATE OF BIRTH:  12-Jun-1941  SUBJECTIVE:  Diarrhea improved. WBC count down to 18 today.  Non-verbal at baseline. PO intake is very poor.    REVIEW OF SYSTEMS:  Review of Systems  Unable to perform ROS due to advanced dementia & non-verbal state.  DRUG ALLERGIES:   Allergies  Allergen Reactions  . Augmentin [Amoxicillin-Pot Clavulanate] Rash  . Latex Rash    VITALS:  Blood pressure 123/54, pulse 87, temperature 98.7 F (37.1 C), temperature source Oral, resp. rate 39, height  (1.651 m), weight 58.015 kg (127 lb 14.4 oz), SpO2 98 %.  PHYSICAL EXAMINATION:  Physical Exam  HENT:  Head: Normocephalic and atraumatic.  Eyes: Conjunctivae are normal. Pupils are equal, round, and reactive to light.  Neck: Normal range of motion. Neck supple. No tracheal deviation present. No thyromegaly present.  Cardiovascular: Normal rate and regular rhythm.   Murmur heard.  Systolic murmur is present with a grade of 2/6  RSB  Pulmonary/Chest: Effort normal and breath sounds normal.  Abdominal: Soft. Bowel sounds are normal. There is no hepatosplenomegaly. There is no tenderness.  Genitourinary:  Rectal tube in place w/ watery diarrhea in bag  Musculoskeletal: Normal range of motion.       Right ankle: She exhibits no swelling.       Left ankle: She exhibits no swelling.  Neurological: She is alert. No cranial nerve deficit. Coordination normal.  Globally weak.   Skin: Skin is warm and dry.  Psychiatric:  Nonverbal at baseline, Flat affect.       LABORATORY PANEL:   CBC  Recent Labs Lab 02/01/15 0435  WBC 18.3*  HGB 9.4*  HCT 28.8*  PLT 760*   Chemistries   Recent Labs Lab 01/26/15 0628  02/01/15 0435  NA  --   < > 137  K 3.9  < > 4.5  CL  --   < > 111  CO2  --   < > 20*   GLUCOSE  --   < > 114*  BUN  --   < > 31*  CREATININE  --   < > 1.15*  CALCIUM  --   < > 8.5*  MG 2.2  --   --   AST  --   < > 49*  ALT  --   < > 84*  ALKPHOS  --   < > 124  BILITOT  --   < > 0.5  < > = values in this interval not displayed.  ASSESSMENT AND PLAN:   * Bacteremia with enterococcus due to urinary tract infection  - was on Vanco and then as per ID desensitized with penicillin. - was having worsening diarrhea and switched from augmentin to oral amoxicillin yesterday.   *Clostridium difficile colitis - on Flagyl, will need 2 weeks of Flagyl after Amoxicillin is  stopped. - diarrhea improved and will d/c rectal tube today.  - po intake is very poor.   * left-sided nephrolithiasis-patient is status post left-sided ureteral stent placement about 3 weeks ago. No abdominal pain, N/V.  - Cr. Stable. Patient is followed by Dr. Vanna Scotland.  * hypokalemia - this is likely secondary to the diarrhea. - repleted and resolved  * abnormal LFTs-this could possibly be related to the underlying  infection and diarrhea. -Abdominal ultrasound does not show any evidence of acute hepatobiliary pathology. - LFT's trending down and almost normal now.    * hypertension-presently hemodynamically stable. Continue metoprolol.  * fungal infection-this is located in bilateral groin area. Continue nystatin cream/powder.  * dementia-continue Aricept, Seroquel. -Avoid benzodiazepines.  * depression-continue Celexa.  * Leukocytosis - ?? Related to c. Diff colitis.  Trending down.  Will hold of on imaging at this time as suggested by ID.  Hemodynamically stable.  - follow WBC count.    Palliative care consult to discuss goals of care.   All the records are reviewed and case discussed with Care Management/Social Workerr. Management plans discussed with the patient, and nursing staff.  I spoke with the patient's son today.  CODE STATUS: DNR  DVT Prophylaxis:  Heparin SQ.   TOTAL TIME  TAKING CARE OF THIS PATIENT: 25 minutes.   POSSIBLE D/C IN 1-2 DAYS, DEPENDING ON CLINICAL CONDITION.  Likely needs SNF as per PT eval and CM working on placement.    [place] SirenSAINANI,VIVEK J M.D on 02/01/2015 at 3:09 PM  Between 7am to 6pm - Pager - [phone]  After 6pm go to [email or web address] - Scientist, research (life sciences)password EPAS [facility]  [facility]  Office  [phone]

## 2015-02-01 NOTE — Evaluation (Signed)
Physical Therapy Evaluation Patient Details Name: Kelli Matthews MRN: [id number] DOB: 09/10/1940 Today's Date: 02/01/2015   History of Present Illness  Kelli Matthews is a 74 y.o. female with a known history of [name], resident of memory unit assisted living facility brought in with the complaints of diarrhea 4 today. She was admitted with c-diff, severe hypokalemia; She also recently was treated for UTI a few weeks ago. Patient is non-verbal at baseline and has difficulty following commands.  Has experienced prolonged hospital stay, currently under evaluation for potential abscess; concern for decline in functional strength and ability.    Clinical Impression  Upon evaluation, patient alert, but non-verbal throughout evaluation.  Patient visually tracks therapist and appears to follow approx 50-75% gestures (able to high-five); responds well to hand-over-hand assist to UEs.  R LE very guarded and fairly rigid in extension, tolerating only 10-20 degrees of hip/knee flexion (?painful?).  Currently requiring mod/max assist for bed mobility and unsupported sitting balance; appears very tachypnic and anxious with transition to upright (vitals stable and WFL).  Unable to tolerate activity beyond sitting edge of bed this date.  Should OOB attempts be needed, recommend use of total lift at this time.  RN informed/aware. Appears to have had significant functional decline throughout hospitalization.  Would benefit from skilled PT to address above deficits and promote optimal return to PLOF; recommend transition to STR upon discharge from acute hospitalization pending ability to actively participate/progress (would benefit most from very functional activities/interventions due to limited ability to participate with structured therex).     Follow Up Recommendations SNF (pending ability to participate/progress with skilled PT services)    Equipment Recommendations       Recommendations for Other  Services       Precautions / Restrictions Precautions Precautions: Fall Restrictions Weight Bearing Restrictions: No      Mobility  Bed Mobility Overal bed mobility: Needs Assistance Bed Mobility: Supine to Sit;Sit to Supine     Supine to sit: Mod assist;Max assist Sit to supine: Max assist      Transfers                 General transfer comment: unsafe/unable to attempt due to inability to maintain static sitting balance  Ambulation/Gait             General Gait Details: unsafe/unable to attempt due to inability to maintain static sitting balance  Stairs            Wheelchair Mobility    Modified Rankin (Stroke Patients Only)       Balance Overall balance assessment: Needs assistance Sitting-balance support: Feet supported;No upper extremity supported Sitting balance-Leahy Scale: Poor   Postural control: Posterior lean;Left lateral lean                                   Pertinent Vitals/Pain Pain Assessment: Faces Faces Pain Scale: Hurts even more Pain Location:  R LE vs. abdomen?  Patient unable to describe, localize Pain Intervention(s): Limited activity within patient's tolerance;Repositioned    Home Living Family/patient expects to be discharged to:: Assisted living                 Additional Comments: According to her son, she was able to walk around ALF without AD    Prior Function Level of Independence: Independent         Comments: Per previous notes and chart review,  patient was indep ambulatory without assist device throughout facility; self-sufficient regarding toileting/ADLs     Hand Dominance        Extremity/Trunk Assessment   Upper Extremity Assessment: Generalized weakness (act assist shoulder elevation to shoulder height only, elbow, wrist and hands grossly WFL; strength at least 3-/5)           Lower Extremity Assessment: Generalized weakness (L LE grossly WFL for ROM, strength at  least 3-/5; R LE very guarded and resistant to movement (?pain?), tolerating only 10-20 degrees of hip/knee flexion)      Cervical / Trunk Assessment: Kyphotic  Communication   Communication: Expressive difficulties (non-verbal at baseline; does appear to follow 50-75% of hand gestures and responds well to hand-over-hand assist)  Cognition Arousal/Alertness: Awake/alert Behavior During Therapy: Anxious Overall Cognitive Status: History of cognitive impairments - at baseline       Memory: Decreased recall of precautions;Decreased short-term memory              General Comments General comments (skin integrity, edema, etc.): rectal tube    Exercises        Assessment/Plan    PT Assessment Patient needs continued PT services  PT Diagnosis Difficulty walking;Generalized weakness;Altered mental status   PT Problem List Decreased strength;Decreased range of motion;Decreased activity tolerance;Decreased balance;Decreased mobility;Decreased coordination;Decreased cognition;Decreased knowledge of use of DME;Decreased safety awareness;Decreased knowledge of precautions;Cardiopulmonary status limiting activity  PT Treatment Interventions DME instruction;Gait training;Functional mobility training;Therapeutic activities;Therapeutic exercise;Balance training;Patient/family education   PT Goals (Current goals can be found in the Care Plan section) Acute Rehab PT Goals Patient Stated Goal: Unable to verbalize PT Goal Formulation: With patient Time For Goal Achievement: 02/15/15 Potential to Achieve Goals: Fair Additional Goals Additional Goal #1: Assess and establish goals for transfers/gait as appropriate.    Frequency Min 2X/week   Barriers to discharge Decreased caregiver support      Co-evaluation               End of Session   Activity Tolerance: Patient limited by pain (limited by anxiety, fatigue) Patient left: with call bell/phone within reach;with bed alarm  set Nurse Communication: Mobility status (response to activity)         Time: 1610-9604 PT Time Calculation (min) (ACUTE ONLY): 17 min   Charges:   PT Evaluation $Initial PT Evaluation Tier I: 1 Procedure     PT G Codes:       Quin Mcpherson H. Manson Passey, PT, DPT 02/01/2015, 11:24 AM [phone]

## 2015-02-01 NOTE — Consult Note (Signed)
Palliative Medicine Inpatient Consult Note   Name: SYMANTHA STEEBER Date: 02/01/2015 MRN: [id number]  DOB: September 23, 1940  Referring Physician: Houston Siren, MD  Palliative Care consult requested for this 74 y.o. female for goals of medical therapy in patient with dementia, aphasia, and HTN.  Pt admitted for c-diff.  Ms. Muise is a 74 year old female with a PMH of dementia, aphasia, HTN, s/p L uretral stent.  Pt presented from SNF for diarrhea. ER workup significant for leukocytosis, dehydration and c-diff. Pt admitted for evaluation and treatment.  Family not at bedside.  Pt resting in bed comfortably in NAD.   REVIEW OF SYSTEMS:  Patient is not able to provide ROS    SOCIAL HISTORY:  reports that she quit smoking about 19 years ago. Her smoking use included Cigarettes. She quit after 17 years of use. She has never used smokeless tobacco. She reports that she does not drink alcohol or use illicit drugs.  LEGAL DOCUMENTS:  none  CODE STATUS: DNR  PAST MEDICAL HISTORY: Past Medical History  Diagnosis Date  . Dementia 2012  . Maxillary sinus fracture     after fall 2013, with LOC  . Hypertension 2001    on medicaitons for at least 10 years  . Other esophagitis 2011  . Personal history of tobacco use, presenting hazards to health 2011  . H/O cystitis 2010  . Arthritis 2005  . Rectal mass 2010  . Alzheimer disease   . Chronic kidney disease   . Anemia   . Alzheimer disease   . Kidney stone   . Hypokalemia   . Hepatic cyst     PAST SURGICAL HISTORY:  Past Surgical History  Procedure Laterality Date  . Breast reduction surgery  2003  . Breast lumpectomy  1985    Benign per pt, right breast  . Colonoscopy  2004, 2010,2011  . Breast reduction surgery  2003or 2004    by Dr. Genevieve Norlander  . Colon surgery  2010    polyps, Dr. Lemar Livings, hemicolectomy  . Vein ligation and stripping  2005  . Wrist surgery  2005  . Upper gi endoscopy  2011  . Varicose veins  2011    laser surgery   . Cystoscopy/ureteroscopy/holmium laser/stent placement Left 01/04/2015    Procedure: CYSTOSCOPY/LEFTURETEROSCOPY/HOLMIUM LASER/LEFT STENT PLACEMENT/LEFT RETROGRADE;  Surgeon: Vanna Scotland, MD;  Location: [facility] ORS;  Service: Urology;  Laterality: Left;    ALLERGIES:  is allergic to augmentin and latex.  MEDICATIONS:  Current Facility-Administered Medications  Medication Dose Route Frequency Provider Last Rate Last Dose  . acetaminophen (TYLENOL) tablet 650 mg  650 mg Oral Q4H PRN Crissie Figures, MD   650 mg at 01/26/15 2155  . amoxicillin (AMOXIL) capsule 500 mg  500 mg Oral 3 times per day Clydie Braun, MD   500 mg at 02/01/15 1401  . antiseptic oral rinse (CPC / CETYLPYRIDINIUM CHLORIDE 0.05%) solution 7 mL  7 mL Mouth Rinse BID Houston Siren, MD   7 mL at 01/31/15 0956  . aspirin EC tablet 81 mg  81 mg Oral Daily Crissie Figures, MD   81 mg at 02/01/15 1059  . citalopram (CELEXA) tablet 20 mg  20 mg Oral Daily Crissie Figures, MD   20 mg at 02/01/15 1059  . donepezil (ARICEPT) tablet 10 mg  10 mg Oral QHS Crissie Figures, MD   10 mg at 01/31/15 2118  . feeding supplement (ENSURE ENLIVE) (ENSURE ENLIVE) liquid 237 mL  237  mL Oral TID WC Houston Siren, MD      . heparin injection 5,000 Units  5,000 Units Subcutaneous 3 times per day Crissie Figures, MD   5,000 Units at 02/01/15 1059  . memantine (NAMENDA) tablet 10 mg  10 mg Oral BID Crissie Figures, MD   10 mg at 02/01/15 1059  . metoprolol succinate (TOPROL-XL) 24 hr tablet 25 mg  25 mg Oral Daily Crissie Figures, MD   25 mg at 02/01/15 1059  . mirtazapine (REMERON) tablet 30 mg  30 mg Oral QHS Crissie Figures, MD   30 mg at 01/31/15 2117  . nystatin cream (MYCOSTATIN)   Topical BID Alford Highland, MD      . potassium chloride SA (K-DUR,KLOR-CON) CR tablet 20 mEq  20 mEq Oral BID Houston Siren, MD   20 mEq at 02/01/15 1059  . QUEtiapine (SEROQUEL) tablet 25 mg  25 mg Oral TID PRN Crissie Figures, MD      . QUEtiapine  (SEROQUEL) tablet 50 mg  50 mg Oral QHS Crissie Figures, MD   50 mg at 01/31/15 2118  . vancomycin (VANCOCIN) 50 mg/mL oral solution 250 mg  250 mg Oral 4 times per day Clydie Braun, MD   250 mg at 02/01/15 1610    Vital Signs: BP 123/54 mmHg  Pulse 87  Temp(Src) 98.7 F (37.1 C) (Oral)  Resp 39  Ht  (1.651 m)  Wt 58.015 kg (127 lb 14.4 oz)  BMI 21.28 kg/m2  SpO2 98% Filed Weights   01/30/15 0607 01/31/15 0507 02/01/15 0456  Weight: 59.33 kg (130 lb 12.8 oz) 57.425 kg (126 lb 9.6 oz) 58.015 kg (127 lb 14.4 oz)    Estimated body mass index is 21.28 kg/(m^2) as calculated from the following:   Height as of this encounter:  (1.651 m).   Weight as of this encounter: 58.015 kg (127 lb 14.4 oz).  PERFORMANCE STATUS (ECOG) : 3 - Symptomatic, >50% confined to bed  PHYSICAL EXAM: Generall: NAD HEENT: OP clear, moist oral mucosa Neck: Trachea midline  Cardiovascular: RRR Pulmonary/Chest: Clear ant fields Abdominal: Soft, NTTP. Hypoactive bowel sounds GU: No SP tenderness, No CVA tenderness Extremities: No edema  Neurological: Grossly nonfocal, aphasic Skin: Warm, dry and intact.  Psychiatric: Calm and alert  LABS: CBC:    Component Value Date/Time   WBC 18.3* 02/01/2015 0435   WBC 10.0 10/09/2013 2109   HGB 9.4* 02/01/2015 0435   HGB 12.2 10/09/2013 2109   HCT 28.8* 02/01/2015 0435   HCT 36.7 10/09/2013 2109   PLT 760* 02/01/2015 0435   PLT 240 10/09/2013 2109   MCV 83.1 02/01/2015 0435   MCV 86 10/09/2013 2109   NEUTROABS 9.0* 01/22/2015 2115   NEUTROABS 7.6* 10/09/2013 2109   LYMPHSABS 1.0 01/22/2015 2115   LYMPHSABS 1.2 10/09/2013 2109   MONOABS 1.0* 01/22/2015 2115   MONOABS 0.7 10/09/2013 2109   EOSABS 0.0 01/22/2015 2115   EOSABS 0.2 10/09/2013 2109   BASOSABS 0.0 01/22/2015 2115   BASOSABS 0.1 10/09/2013 2109   Comprehensive Metabolic Panel:    Component Value Date/Time   NA 137 02/01/2015 0435   NA 139 10/09/2013 2109   K 4.5 02/01/2015  0435   K 3.4* 10/09/2013 2109   CL 111 02/01/2015 0435   CL 104 10/09/2013 2109   CO2 20* 02/01/2015 0435   CO2 27 10/09/2013 2109   BUN 31* 02/01/2015 0435   BUN 39* 10/09/2013 2109  CREATININE 1.15* 02/01/2015 0435   CREATININE 1.17 10/09/2013 2109   CREATININE 0.83 11/22/2011 1724   GLUCOSE 114* 02/01/2015 0435   GLUCOSE 88 10/09/2013 2109   CALCIUM 8.5* 02/01/2015 0435   CALCIUM 8.6 10/09/2013 2109   AST 49* 02/01/2015 0435   AST 35 10/09/2013 2109   ALT 84* 02/01/2015 0435   ALT 40 10/09/2013 2109   ALKPHOS 124 02/01/2015 0435   ALKPHOS 144* 10/09/2013 2109   BILITOT 0.5 02/01/2015 0435   PROT 6.1* 02/01/2015 0435   PROT 7.3 10/09/2013 2109   ALBUMIN 2.7* 02/01/2015 0435   ALBUMIN 3.5 10/09/2013 2109    IMPRESSION:  [name] is a 74 year old female with a PMH of dementia, aphasia, HTN, s/p L uretral stent.  Pt presented from SNF for diarrhea. ER workup significant for leukocytosis, dehydration and c-diff. Pt admitted for evaluation and treatment.  Family not at bedside.  Pt currently resting in bed.  Pt with continued leukocytosis and diarrhea during admission despite treatment. Family not available for goals of care meeting. Pt is currently DNR per previous notes.  From reviewing pt's chart, pt may qualify for hospice services upon discharge.  Will speak with family when available.  PLAN: 1. Family meeting    More than 50% of the visit was spent in counseling/coordination of care: Yes  Time Spent: 75 minutes

## 2015-02-01 NOTE — Plan of Care (Signed)
Problem: Discharge Progression Outcomes Goal: Other Discharge Outcomes/Goals Outcome: Progressing Plan of care progress to goals:  1. [facility]:     [facility] Consult. 2. Hemodynamically:     VSS. Afebrile.     Elevated WBC's.     Remains on oral antibiotics. 3. Complications:      Perineum & buttocks continues to be excoriated, Nystatin cream scheduled     Rectal tube removed as ordered.       4. Diet:      Dysphagia I. Feeder. Decreased oral intake. 5. Activity:     Total care patient.     Non ambulatory.     Nonverbal.

## 2015-02-01 NOTE — Plan of Care (Signed)
Problem: Discharge Progression Outcomes Goal: Other Discharge Outcomes/Goals Plan of care progress to goals: 1. Discharge plan: per report patient may not be appropriate to [facility], patient not following commands as needed and per son patient will need 24hours sitters [facility].      2. Hemodynamically:             -VSS, afebrile             -elevated WBC's             -PO antibiotics; infectious disease following      3. Complications: perineum & buttocks continues to be excoriated, Nystatin cream scheduled; rectal tube remains in place      4. Diet: Tolerating Dysphagia I requiring a feeder with good PO intake 5. Activity: total care patient, non ambulatory, non verbal.

## 2015-02-01 NOTE — Progress Notes (Signed)
Nutrition Follow-up  INTERVENTION: Meals and Snacks: Cater to patient preferences Medical Food Supplement Therapy: will recommend adding Ensure Enlive (each supplement provides 350kcal and 20 grams of protein) TID for added nutrition and continue Magic Cup as ordered  NUTRITION DIAGNOSIS:  Swallowing difficulty related to dysphagia as evidenced by  (diet order, SLP evaluation); ongoing  GOAL:  Patient will meet greater than or equal to 90% of their needs; ongoing  MONITOR:   (Energy Intake, digestive System, Electrolyte and Renal Profile, Hepatic Profile)  REASON FOR ASSESSMENT:   (RD Screen Length of Stay)    ASSESSMENT:  Pt diarrhea improving per MD note and rectal tube to be d/c.   Current Nutrition: Pt is nonverbal and a feeder. Per CNA Clyde Canterbury pt usually eating one meal per day very well; for example pt ate bites of breakfast, magic cup at lunch and then eats 100% of everything on dinner tray and more yesterday. Per I and O chart, 100% of breakfast yesterday. Limited documentation as pt on isolation. CNA Clyde Canterbury reports pt eats [employer] well.   Medications: Remeron, KCl Labs: Electrolyte and Renal Profile:  Recent Labs Lab 01/26/15 0628 01/29/15 0458 01/31/15 0444 02/01/15 0435  BUN  --  21* 28* 31*  CREATININE  --  1.05* 1.10* 1.15*  NA  --  134* 135 137  K 3.9 4.9 4.5 4.5  MG 2.2  --   --   --    Glucose Profile: No results for input(s): GLUCAP in the last 72 hours. Protein Profile:  Recent Labs Lab 01/29/15 0458 01/31/15 0444 02/01/15 0435  ALBUMIN 2.4* 2.6* 2.7*   Hepatic Profile: Hepatic Function Latest Ref Rng 02/01/2015 01/31/2015 01/29/2015  Total Protein 6.5 - 8.1 g/dL 6.1(L) 6.2(L) 5.7(L)  Albumin 3.5 - 5.0 g/dL 2.7(L) 2.6(L) 2.4(L)  AST 15 - 41 U/L 49(H) 90(H) 200(H)  ALT 14 - 54 U/L 84(H) 108(H) 149(H)  Alk Phosphatase 38 - 126 U/L 124 146(H) 192(H)  Total Bilirubin 0.3 - 1.2 mg/dL 0.5 0.4 0.6  Bilirubin, Direct 0.1 - 0.5 mg/dL - - -    Anthropometrics:  Filed Weights   01/30/15 0607 01/31/15 0507 02/01/15 0456  Weight: 130 lb 12.8 oz (59.33 kg) 126 lb 9.6 oz (57.425 kg) 127 lb 14.4 oz (58.015 kg)    BMI:  Body mass index is 21.28 kg/(m^2).  Estimated Nutritional Needs:  Kcal:  BEE: 1085kcals, TEE: (IF 1.1-1.3)(AF 1.2) 1433-1694kcals  Protein:  58-70g protein (1.0-1.2g/kg)  Fluid:  1450-1776m of fluid (25-373mkg)  Diet Order:  DIET - DYS 1 Room service appropriate?: No; Fluid consistency:: Thin DIET - DYS 1 DIET - DYS 1  EDUCATION NEEDS:  Education needs no appropriate at this time  No intake or output data in the 24 hours ending 02/01/15 1545  Last BM:  6/6 Loose green stool via rectal tube  MODERATE Care Level  AlDwyane LuoRD, LDN Pager [phone]

## 2015-02-01 NOTE — Telephone Encounter (Signed)
Pts son called and left a Voice Message which was answered yesterday. He wanted to let us know that his mother was suppose to have surgery the day he called,but the pt was hospitalized due to an infection. Best contact # [phone] 02/01/15 maf

## 2015-02-02 LAB — BASIC METABOLIC PANEL
Anion gap: 8 (ref 5–15)
BUN: 30 mg/dL — AB (ref 6–20)
CALCIUM: 9 mg/dL (ref 8.9–10.3)
CO2: 22 mmol/L (ref 22–32)
Chloride: 111 mmol/L (ref 101–111)
Creatinine, Ser: 1.14 mg/dL — ABNORMAL HIGH (ref 0.44–1.00)
GFR calc Af Amer: 54 mL/min — ABNORMAL LOW (ref >60)
GFR calc non Af Amer: 47 mL/min — ABNORMAL LOW (ref >60)
Glucose, Bld: 83 mg/dL (ref 65–99)
POTASSIUM: 4.6 mmol/L (ref 3.5–5.1)
Sodium: 141 mmol/L (ref 135–145)

## 2015-02-02 LAB — CBC
HEMATOCRIT: 30.3 % — AB (ref 35.0–47.0)
Hemoglobin: 9.4 g/dL — ABNORMAL LOW (ref 12.0–16.0)
MCH: 26.4 pg (ref 26.0–34.0)
MCHC: 31.1 g/dL — AB (ref 32.0–36.0)
MCV: 84.8 fL (ref 80.0–100.0)
PLATELETS: 756 10*3/uL — AB (ref 150–440)
RBC: 3.57 MIL/uL — ABNORMAL LOW (ref 3.80–5.20)
RDW: 15.9 % — ABNORMAL HIGH (ref 11.5–14.5)
WBC: 14.5 10*3/uL — ABNORMAL HIGH (ref 3.6–11.0)

## 2015-02-02 LAB — MAGNESIUM: Magnesium: 2 mg/dL (ref 1.7–2.4)

## 2015-02-02 NOTE — Consult Note (Signed)
Spoke with son, Kelli Matthews, by phone.  Updated son on pt's acute and chronic problems.  Spoke about goals of care.  Kelli Matthews states he will speak with his sister today to discuss options of SNF vs Kelli Matthews with 24/7 sitter and hospice services vs stopping antibiotics and transfer to hospice home.  Kelli Matthews asked for me to call him back tomorrow afternoon to discuss.  Continue current therapy and 24 hour calorie count until then.

## 2015-02-02 NOTE — Progress Notes (Signed)
Nutrition Follow-up  INTERVENTION: Coordination of Care: RD notes, calorie count ordered per Palliative Care NP note for 24 hours, per order 48 hours. CNA Ocie CornfieldAmeila, CNA [place]ittany and [employer] Krista aware. Meals and Snacks: Cater to patient preferences Medical Food Supplement Therapy: will recommend continuing Ensure Enlive (each supplement provides 350kcal and 20 grams of protein), Mighty Shake daily and Magic Cup BID  NUTRITION DIAGNOSIS:  Swallowing difficulty related to dysphagia as evidenced by  (diet order, SLP evaluation).  GOAL:  Patient will meet greater than or equal to 90% of their needs; ongoing  MONITOR:   (Energy Intake, digestive System, Electrolyte and Renal Profile, Hepatic Profile)  REASON FOR ASSESSMENT:   (Calorie Count Ordered) Calorie Count  ASSESSMENT:  Pt with discharge orders in, placement pending. Per palliative care, calorie count ordered.  Current Nutrition: Per CNA Amelia pt ate 75% of eggs, 50% of Ensure, 90% of Mighty shake, 50% of yogurt and pureed cranberries with sips of orange juice this am.  Pt awaiting lunch tray.  Medications: Remeron, KCl Labs: Electrolyte and Renal Profile:  Recent Labs Lab 01/31/15 0444 02/01/15 0435 02/02/15 0513  BUN 28* 31* 30*  CREATININE 1.10* 1.15* 1.14*  NA 135 137 141  K 4.5 4.5 4.6  MG  --   --  2.0   Glucose Profile: No results for input(s): GLUCAP in the last 72 hours. Protein Profile:  Recent Labs Lab 01/29/15 0458 01/31/15 0444 02/01/15 0435  ALBUMIN 2.4* 2.6* 2.7*   Anthropometrics: Weight trend since admission Filed Weights   01/31/15 0507 02/01/15 0456 02/02/15 0500  Weight: 126 lb 9.6 oz (57.425 kg) 127 lb 14.4 oz (58.015 kg) 127 lb 11.2 oz (57.924 kg)    BMI:  Body mass index is 21.25 kg/(m^2).  Estimated Nutritional Needs:  Kcal:  BEE: 1085kcals, TEE: (IF 1.1-1.3)(AF 1.2) 1433-1694kcals  Protein:  58-70g protein (1.0-1.2g/kg)  Fluid:  1450-176440mL of fluid (25-2530mL/kg)  Diet  Order:  DIET - DYS 1 Room service appropriate?: No; Fluid consistency:: Thin DIET - DYS 1 DIET - DYS 1 Diet - low sodium heart healthy  EDUCATION NEEDS:  Education needs no appropriate at this time  No intake or output data in the 24 hours ending 02/02/15 1424  Last BM:  6/8 loose green BM  HIGH Care Level  Leda QuailAllyson Tamaria Dunleavy, RD, LDN Pager [phone]

## 2015-02-02 NOTE — Plan of Care (Signed)
Problem: Discharge Progression Outcomes Goal: Other Discharge Outcomes/Goals Outcome: Progressing Plan of care progress to goals:  1. [facility]charge plan:     [facility] Care Consult. 2. Hemodynamically:     VSS. Afebrile.     Elevated WBC's.     Remains on oral antibiotics. 3. Complications:       Perineum & Buttocks remains excoriated.      Nystatin cream and barrier cream applied.      Turn Q2hrs from Left to Right.  4. Diet:       Dysphagia I Diet. Feeder. Increased Appetite.   5. Activity:     Total care patient.     Non ambulatory.     Nonverbal.

## 2015-02-02 NOTE — Progress Notes (Addendum)
Patient ID: Kelli FlemingRachel E Matthews, female   DOB: 02/12/1941, 74 y.o.   MRN: [id number]  [facility] [place] [facility]  PATIENT NAME: Kelli RoysRachel Matthews    MR#:  [id number]  DATE OF BIRTH:  03/03/1941  SUBJECTIVE:  One loose stool today per RN. WBC count down to 14.5 today.  Non-verbal at baseline. NAD.  REVIEW OF SYSTEMS:  Review of Systems  Unable to perform ROS due to advanced dementia & non-verbal state.  DRUG ALLERGIES:   Allergies  Allergen Reactions  . Augmentin [Amoxicillin-Pot Clavulanate] Rash  . Latex Rash    VITALS:  Blood pressure 143/55, pulse 95, temperature 98.6 F (37 C), temperature source Oral, resp. rate 18, height 5\' 5"  (1.651 m), weight 57.924 kg (127 lb 11.2 oz), SpO2 99 %.  PHYSICAL EXAMINATION:  Physical Exam  HENT:  Head: Normocephalic and atraumatic.  Eyes: Conjunctivae are normal. Pupils are equal, round, and reactive to light.  Neck: Normal range of motion. Neck supple. No tracheal deviation present. No thyromegaly present.  Cardiovascular: Normal rate and regular rhythm.   Murmur heard.  Systolic murmur is present with a grade of 2/6  RSB  Pulmonary/Chest: Effort normal and breath sounds normal.  Abdominal: Soft. Bowel sounds are normal. There is no hepatosplenomegaly. There is no tenderness.  Musculoskeletal: Normal range of motion.       Right ankle: She exhibits no swelling.       Left ankle: She exhibits no swelling.  Neurological: not follow commands, unable to exam.  Skin: no rash or jaundice. Psychiatric:  Nonverbal at baseline, Flat affect.       LABORATORY PANEL:   CBC  Recent Labs Lab 02/02/15 0513  WBC 14.5*  HGB 9.4*  HCT 30.3*  PLT 756*   Chemistries   Recent Labs Lab 02/01/15 0435 02/02/15 0513  NA 137 141  K 4.5 4.6  CL 111 111  CO2 20* 22  GLUCOSE 114* 83  BUN 31* 30*  CREATININE 1.15* 1.14*  CALCIUM 8.5* 9.0  MG  --  2.0  AST 49*  --   ALT 84*  --   ALKPHOS 124  --   BILITOT  0.5  --     ASSESSMENT AND PLAN:   * Bacteremia with enterococcus due to urinary tract infection (presented on admission date). - was on Vanco and then as per ID desensitized with penicillin. - was having worsening diarrhea and switched from augmentin to oral amoxicillin 2 days ago.   *Clostridium difficile colitis - was on Flagyl,  Now on vancomycin po qid. - diarrhea improved and rectal tube was removed.  * left-sided nephrolithiasis-patient is status post left-sided ureteral stent placement about 3 weeks ago. No abdominal pain, N/V.  - Cr. Stable. Patient is followed by Dr. Vanna ScotlandAshley Brandon.  * hypokalemia - this is likely secondary to the diarrhea. - repleted and resolved  * abnormal LFTs-this could possibly be related to the underlying infection and diarrhea. -Abdominal ultrasound does not show any evidence of acute hepatobiliary pathology. - LFT's trending down and almost normal now.    * hypertension-presently hemodynamically stable. Continue metoprolol.  * fungal infection-this is located in bilateral groin area. Continue nystatin cream/powder.  * dementia-continue Aricept, Seroquel. -Avoid benzodiazepines.  * depression-continue Celexa.  * Leukocytosis - possible Related to c. Diff colitis.  Trending down.  Will hold of on imaging at this time as suggested by ID.  Hemodynamically stable.  - follow WBC count.    Palliative  care Brett Canales discussed with the patient's son about hospice home, waiting for decision.  All the records are reviewed and case discussed with Care Management/Social Workerr. Management plans discussed with the RN, case manager and Palliative care Brett Canales.  CODE STATUS: DNR  DVT Prophylaxis:  Heparin SQ.   TOTAL TIME TAKING CARE OF THIS PATIENT: 37 minutes.   POSSIBLE D/C IN 1-2 DAYS, DEPENDING ON CLINICAL CONDITION.  Likely needs SNF as per PT eval and CM working on placement.    Shaune Pollack M.D on 02/02/2015 at 3:04 PM  Between 7am to 6pm - Pager  - [phone]  After 6pm go to [email or web address] - Scientist, research (life sciences) Hospitalists  Office  [phone]

## 2015-02-02 NOTE — Clinical Documentation Improvement (Signed)
MD's, NP's, and PA's   Noted documentation of "bacteremia" due "UTI", on admit WBC 11.1, temp of 103, 102.6, pulse in 90's. If the diagnosis was present on admit and appropriate for this admission please document in notes. Thank you   Possible Clinical Conditions?  Septicemia / Sepsis POA  SIRS  Sepsis with UTI POA  Other Condition   Cannot clinically Determine      Risk Factors: Cdiff Colitis, recurrent  UTI, recent stent placement for Renal Stones    Treatment: IV, Vancomycin, Penicllin  Thank You, Lavonda JumboLawanda J Montague Corella ,RN Clinical Documentation Specialist:  [phone]  [facility]- Health Information Management

## 2015-02-02 NOTE — Consult Note (Signed)
Palliative Medicine Inpatient Consult Follow Up Note   Name: Kelli FlemingRachel E Reeb Date: 02/02/2015 MRN: [id number]  DOB: 07/18/1941  Referring Physician: Shaune PollackQing Chen, MD  Palliative Care consult requested for this 74 y.o. female for goals of medical therapy in patient with with dementia, aphasia, and HTN. Pt admitted for c-diff.  Pt resting in bed and watching TV.  Pt appears in NAD.    REVIEW OF SYSTEMS:  Patient is not able to provide ROS  CODE STATUS: DNR   PAST MEDICAL HISTORY: Past Medical History  Diagnosis Date  . Dementia 2012  . Maxillary sinus fracture     after fall 2013, with LOC  . Hypertension 2001    on medicaitons for at least 10 years  . Other esophagitis 2011  . Personal history of tobacco use, presenting hazards to health 2011  . H/O cystitis 2010  . Arthritis 2005  . Rectal mass 2010  . Alzheimer disease   . Chronic kidney disease   . Anemia   . Alzheimer disease   . Kidney stone   . Hypokalemia   . Hepatic cyst     PAST SURGICAL HISTORY:  Past Surgical History  Procedure Laterality Date  . Breast reduction surgery  2003  . Breast lumpectomy  1985    Benign per pt, right breast  . Colonoscopy  2004, 2010,2011  . Breast reduction surgery  2003or 2004    by Dr. Genevieve NorlanderAu  . Colon surgery  2010    polyps, Dr. Lemar LivingsByrnett, hemicolectomy  . Vein ligation and stripping  2005  . Wrist surgery  2005  . Upper gi endoscopy  2011  . Varicose veins  2011    laser surgery  . Cystoscopy/ureteroscopy/holmium laser/stent placement Left 01/04/2015    Procedure: CYSTOSCOPY/LEFTURETEROSCOPY/HOLMIUM LASER/LEFT STENT PLACEMENT/LEFT RETROGRADE;  Surgeon: Vanna ScotlandAshley Brandon, MD;  Location: [facility] ORS;  Service: Urology;  Laterality: Left;    Vital Signs: BP 143/55 mmHg  Pulse 95  Temp(Src) 98.6 F (37 C) (Oral)  Resp 18  Ht 5\' 5"  (1.651 m)  Wt 57.924 kg (127 lb 11.2 oz)  BMI 21.25 kg/m2  SpO2 99% Filed Weights   01/31/15 0507 02/01/15 0456 02/02/15 0500  Weight: 57.425 kg  (126 lb 9.6 oz) 58.015 kg (127 lb 14.4 oz) 57.924 kg (127 lb 11.2 oz)    Estimated body mass index is 21.25 kg/(m^2) as calculated from the following:   Height as of this encounter: 5\' 5"  (1.651 m).   Weight as of this encounter: 57.924 kg (127 lb 11.2 oz).  PHYSICAL EXAM: General: NAD HEENT: OP clear, moist oral mucosa Neck: Trachea midline  Cardiovascular: RRR Pulmonary/Chest: Clear ant fields Abdominal: Soft, NTTP. Hypoactive bowel sounds GU: No SP tenderness, No CVA tenderness Extremities: No edema  Neurological: Grossly nonfocal, aphasic Skin: Warm, dry and intact.  Psychiatric: Calm and alert  LABS: CBC:    Component Value Date/Time   WBC 14.5* 02/02/2015 0513   WBC 10.0 10/09/2013 2109   HGB 9.4* 02/02/2015 0513   HGB 12.2 10/09/2013 2109   HCT 30.3* 02/02/2015 0513   HCT 36.7 10/09/2013 2109   PLT 756* 02/02/2015 0513   PLT 240 10/09/2013 2109   MCV 84.8 02/02/2015 0513   MCV 86 10/09/2013 2109   NEUTROABS 9.0* 01/22/2015 2115   NEUTROABS 7.6* 10/09/2013 2109   LYMPHSABS 1.0 01/22/2015 2115   LYMPHSABS 1.2 10/09/2013 2109   MONOABS 1.0* 01/22/2015 2115   MONOABS 0.7 10/09/2013 2109   EOSABS 0.0 01/22/2015 2115  EOSABS 0.2 10/09/2013 2109   BASOSABS 0.0 01/22/2015 2115   BASOSABS 0.1 10/09/2013 2109   Comprehensive Metabolic Panel:    Component Value Date/Time   NA 141 02/02/2015 0513   NA 139 10/09/2013 2109   K 4.6 02/02/2015 0513   K 3.4* 10/09/2013 2109   CL 111 02/02/2015 0513   CL 104 10/09/2013 2109   CO2 22 02/02/2015 0513   CO2 27 10/09/2013 2109   BUN 30* 02/02/2015 0513   BUN 39* 10/09/2013 2109   CREATININE 1.14* 02/02/2015 0513   CREATININE 1.17 10/09/2013 2109   CREATININE 0.83 11/22/2011 1724   GLUCOSE 83 02/02/2015 0513   GLUCOSE 88 10/09/2013 2109   CALCIUM 9.0 02/02/2015 0513   CALCIUM 8.6 10/09/2013 2109   AST 49* 02/01/2015 0435   AST 35 10/09/2013 2109   ALT 84* 02/01/2015 0435   ALT 40 10/09/2013 2109   ALKPHOS 124  02/01/2015 0435   ALKPHOS 144* 10/09/2013 2109   BILITOT 0.5 02/01/2015 0435   PROT 6.1* 02/01/2015 0435   PROT 7.3 10/09/2013 2109   ALBUMIN 2.7* 02/01/2015 0435   ALBUMIN 3.5 10/09/2013 2109    IMPRESSION:  Ms. Valtierra is a 74 year old female with a PMH of dementia, aphasia, HTN, s/p L uretral stent. Pt presented from SNF for diarrhea. ER workup significant for leukocytosis, dehydration and c-diff. Pt admitted for evaluation and treatment. Family not at bedside.  Pt currently resting in bed comfortably.  WBC improving today. Will order 24 hour calorie count to assess pt's intake.  Pt now down two BM in last 24 hours.  PT to work with pt today.  Attempted to contact son for goals of care conversation and message left.  Will continue to attempt.  PLAN: 1. Goals of care conversation with son when able     More than 50% of the visit was spent in counseling/coordination of care: YES  Time Spent: 25 minutes

## 2015-02-02 NOTE — Plan of Care (Addendum)
Problem: Discharge Progression Outcomes Goal: Other Discharge Outcomes/Goals Outcome: Progressing Plan of care progress to goal for: Pain-no c/o pain this shift Hemodynamically-VSS Complications-pt continues to have excoriation to groin and buttocks, nystatin cream applied per order Diet-pt tolerating diet Activity-pt on bedrest, pt non-verbal

## 2015-02-03 LAB — BASIC METABOLIC PANEL
Anion gap: 9 (ref 5–15)
BUN: 41 mg/dL — ABNORMAL HIGH (ref 6–20)
CO2: 21 mmol/L — AB (ref 22–32)
Calcium: 8.2 mg/dL — ABNORMAL LOW (ref 8.9–10.3)
Chloride: 106 mmol/L (ref 101–111)
Creatinine, Ser: 1.15 mg/dL — ABNORMAL HIGH (ref 0.44–1.00)
GFR, EST AFRICAN AMERICAN: 53 mL/min — AB (ref >60)
GFR, EST NON AFRICAN AMERICAN: 46 mL/min — AB (ref >60)
Glucose, Bld: 117 mg/dL — ABNORMAL HIGH (ref 65–99)
Potassium: 4.6 mmol/L (ref 3.5–5.1)
SODIUM: 136 mmol/L (ref 135–145)

## 2015-02-03 LAB — CBC
HCT: 29.9 % — ABNORMAL LOW (ref 35.0–47.0)
HEMOGLOBIN: 9.6 g/dL — AB (ref 12.0–16.0)
MCH: 27.1 pg (ref 26.0–34.0)
MCHC: 32.1 g/dL (ref 32.0–36.0)
MCV: 84.6 fL (ref 80.0–100.0)
Platelets: 722 10*3/uL — ABNORMAL HIGH (ref 150–440)
RBC: 3.54 MIL/uL — ABNORMAL LOW (ref 3.80–5.20)
RDW: 16.2 % — ABNORMAL HIGH (ref 11.5–14.5)
WBC: 15.1 10*3/uL — AB (ref 3.6–11.0)

## 2015-02-03 MED ORDER — SODIUM CHLORIDE 0.9 % IV SOLN
INTRAVENOUS | Status: AC
  Administered 2015-02-03 – 2015-02-04 (×2): via INTRAVENOUS

## 2015-02-03 NOTE — Plan of Care (Signed)
Problem: Discharge Progression Outcomes Goal: Other Discharge Outcomes/Goals Outcome: Progressing Plan of care progress to goals: 1. Hemodynamically: VSS, Afebrile. Elevated WBC's, PO abx given as scheduled. 2. Complications: Perineum & Buttocks remains excoriated. Nystatin cream ordered & turning Q2h   3. Diet:   Dysphagia I Diet. Pt is a feeder    4. Activity:Total care patient who is Non ambulatory.

## 2015-02-03 NOTE — Progress Notes (Signed)
Date of Admission:  01/22/2015     ID: MADDIGAN LEGLEITER is a 74 y.o. female with  UTI and bacteremia  Principal Problem:   C. difficile diarrhea Active Problems:   Hypertension   Dementia with behavioral disturbance   Acute pancreatitis   Hypokalemia   Elevated LFTs   Subjective: Remains quite confused Less diarrhea  Medications:  . amoxicillin  500 mg Oral 3 times per day  . antiseptic oral rinse  7 mL Mouth Rinse BID  . aspirin EC  81 mg Oral Daily  . citalopram  20 mg Oral Daily  . donepezil  10 mg Oral QHS  . feeding supplement (ENSURE ENLIVE)  237 mL Oral TID WC  . heparin  5,000 Units Subcutaneous 3 times per day  . memantine  10 mg Oral BID  . metoprolol succinate  25 mg Oral Daily  . mirtazapine  30 mg Oral QHS  . nystatin cream   Topical BID  . potassium chloride  20 mEq Oral BID  . QUEtiapine  50 mg Oral QHS  . vancomycin  250 mg Oral 4 times per day    Objective: Vital signs in last 24 hours: Temp:  [98.6 F (37 C)-98.8 F (37.1 C)] 98.6 F (37 C) (06/09 0529) Pulse Rate:  [94-100] 97 (06/09 1302) Resp:  [16-22] 22 (06/09 1302) BP: (134-138)/(54-97) 137/97 mmHg (06/09 1302) SpO2:  [97 %-100 %] 99 % (06/09 1302) Weight:  [58.378 kg (128 lb 11.2 oz)] 58.378 kg (128 lb 11.2 oz) (06/09 0420) Constitutional: Basically non verbal during exam, thin, chronically ill appearing HENT: [place]/AT, PERRLA, no scleral icterus Mouth/Throat: Oropharynx is clear and dry. No oropharyngeal exudate.  Cardiovascular: Normal rate, regular rhythm and normal heart sounds. Exam reveals no gallop and no friction rub.  No murmur heard.  Pulmonary/Chest: Effort normal and breath sounds normal. No respiratory distress. has no wheezes.  Neck supple, no nuchal rigidity Abdominal: Soft. Bowel sounds are normal. exhibits no distension. There is no tenderness.  Lymphadenopathy: no cervical adenopathy. No axillary adenopathy Neurological: non verbal Skin: Skin is warm and dry. No  rash noted. No erythema.  Psychiatric: non verbal, calm  Lab Results  Recent Labs  02/02/15 0513 02/03/15 0432  WBC 14.5* 15.1*  HGB 9.4* 9.6*  HCT 30.3* 29.9*  NA 141 136  K 4.6 4.6  CL 111 106  CO2 22 21*  BUN 30* 41*  CREATININE 1.14* 1.15*   Liver Panel  Recent Labs  02/01/15 0435  PROT 6.1*  ALBUMIN 2.7*  AST 49*  ALT 84*  ALKPHOS 124  BILITOT 0.5    Microbiology: Results for orders placed or performed during the hospital encounter of 01/22/15  C difficile quick scan w PCR reflex [facility])     Status: None   Collection Time: 01/22/15  9:58 PM  Result Value Ref Range Status   C Diff antigen POSITIVE  Final   C Diff toxin NEGATIVE  Final   C Diff interpretation REFLEX TO PCR  Final  Urine culture     Status: None   Collection Time: 01/22/15  9:58 PM  Result Value Ref Range Status   Specimen Description URINE, RANDOM  Final   Special Requests Normal  Final   Culture >=100,000 COLONIES/mL ENTEROCOCCUS RAFFINOSUS  Final   Report Status 01/31/2015 FINAL  Final   Organism ID, Bacteria ENTEROCOCCUS RAFFINOSUS  Final      Susceptibility   Enterococcus raffinosus - MIC (ETEST)*    AMPICILLIN SENSITIVE Sensitive  VANCOMYCIN SENSITIVE Sensitive     LEVOFLOXACIN RESISTANT Resistant     LINEZOLID INTERMEDIATE Intermediate     * >=100,000 COLONIES/mL ENTEROCOCCUS RAFFINOSUS  Clostridium Difficile by PCR (not at [facility])     Status: Abnormal   Collection Time: 01/22/15  9:58 PM  Result Value Ref Range Status   C difficile by pcr POSITIVE (A) NEGATIVE Final    Comment: CRITICAL RESULT CALLED TO, READ BACK BY AND VERIFIED WITH: LUIS FLORES AT 2336 01/22/15.PMH   Culture, blood (routine x 2)     Status: None   Collection Time: 01/22/15 10:53 PM  Result Value Ref Range Status   Specimen Description BLOOD  Final   Special Requests Normal  Final   Culture  Setup Time   Final    GRAM POSITIVE DIPLOCOCCI AEROBIC BOTTLE ONLY CRITICAL RESULT CALLED TO, READ BACK BY AND  VERIFIED WITH: Acquanetta Sit [facility] AT 2114 01/23/15.PMH    Culture   Final    ENTEROCOCCUS RAFFINOSUS AEROBIC BOTTLE ONLY REFER TO OTHER SET FOR SENSITIVITIES     Report Status 01/27/2015 FINAL  Final  Culture, blood (routine x 2)     Status: None   Collection Time: 01/22/15 10:53 PM  Result Value Ref Range Status   Specimen Description BLOOD  Final   Special Requests Normal  Final   Culture  Setup Time   Final    GRAM POSITIVE DIPLOCOCCI IN BOTH AEROBIC AND ANAEROBIC BOTTLES CRITICAL RESULT CALLED TO, READ BACK BY AND VERIFIED WITH: CALLED TO SHAE BREWER ON 01/22/18 AT 1820 BY JEF GRAM POSITIVE DIPLOCOCCI CRITICAL RESULT CALLED TO, READ BACK BY AND VERIFIED WITH: Acquanetta Sit [facility] AT 2114 01/23/15.PMH    Culture   Final    ENTEROCOCCUS RAFFINOSUS IN BOTH AEROBIC AND ANAEROBIC BOTTLES    Report Status 01/27/2015 FINAL  Final   Organism ID, Bacteria ENTEROCOCCUS RAFFINOSUS  Final      Susceptibility   Enterococcus raffinosus - MIC (ETEST)*    AMPICILLIN Value in next row Sensitive      SENSITIVE8.0    VANCOMYCIN Value in next row Sensitive      SENSITIVE1.5    LEVOFLOXACIN Value in next row Resistant      RESISTANT>32    LINEZOLID Value in next row Sensitive      SENSITIVE2.0    * ENTEROCOCCUS RAFFINOSUS  Stool culture     Status: None   Collection Time: 01/24/15  2:52 AM  Result Value Ref Range Status   Specimen Description STOOL  Final   Special Requests NONE  Final   Culture   Final    NO SALMONELLA OR SHIGELLA ISOLATED No Pathogenic E. coli detected NO CAMPYLOBACTER DETECTED    Report Status 01/27/2015 FINAL  Final  Culture, blood (routine x 2)     Status: None   Collection Time: 01/25/15  3:20 PM  Result Value Ref Range Status   Specimen Description BLOOD  Final   Special Requests BLOOD  Final   Culture NO GROWTH 5 DAYS  Final   Report Status 01/30/2015 FINAL  Final  Culture, blood (routine x 2)     Status: None   Collection Time: 01/25/15  3:25 PM  Result Value Ref  Range Status   Specimen Description BLOOD  Final   Special Requests BLOOD  Final   Culture NO GROWTH 5 DAYS  Final   Report Status 01/30/2015 FINAL  Final    Assessment/Plan: RYANN PAULI is a 74 y.o. female with advanced dementia, living  at facility with recent UTI and nephrolithiaisis s/p stenting approx 2 weeks prior to admission admitted 5/29 with fevers, diarrhea and confusion. Found to have wbc 15, and C diff +. Had recently been on bactrim per HPI for UTI. Since admit wbc had normalized and defervesced. Was treated initially with  ceftraixone, IV vanco and oral flagyl. Bcx with Enterococcus Raffinosus which was Amp, vanco linezolid sensitive.  UCX also with Enterococcus raffinosuss (confrimed with micro lab). Repeat bcx 5/31 neg to date. Initial culture 5/29 +.   Has augmentin allergy but has tolerated PCN desens and was on Augmentin but wbc now up to 21 again and diarrhea persists.  Recommendations Continue  amoxicillin 500 tid for total 14 day course froafter stopping other abxm 5/29 initiation of vanco - stop date June 11th Continue oral vanco  for C diff - will need 10 days  After stopping amox- stop date 6/21  [facility], Shakeila Pfarr   02/03/2015, 2:38 PM

## 2015-02-03 NOTE — Plan of Care (Signed)
Problem: Discharge Progression Outcomes Goal: Other Discharge Outcomes/Goals Plan of care progress to goals: 1. Hemodynamically: VSS, Afebrile. Elevated WBC's, PO abx given as scheduled. 2. Complications: Perineum & Buttocks remains excoriated. Nystatin cream ordered & turning Q2h  3. Diet:   Dysphagia I Diet. Pt is a feeder   4. Activity:Total care patient who is Non ambulatory. No fall/injury this shift. Will continue to assess

## 2015-02-03 NOTE — Clinical Social Work Note (Signed)
Pt's disposition will be SNF. CSW updated SNF search with additional PT notes, and awaiting for bed offers. CSW will follow up with bed offers. CSW will continue to follow.   Dede Query, MSW, LCSW Clinical Social Worker  [phone]

## 2015-02-03 NOTE — Consult Note (Signed)
Palliative Medicine Inpatient Consult Follow Up Note   Name: Kelli Matthews Date: 02/03/2015 MRN: [id number]  DOB: [birth year]  Referring Physician: Shaune Pollack, MD  Palliative Care consult requested for this 74 y.o. female for goals of medical therapy in patient with dementia, aphasia, and HTN. Pt admitted for c-diff.Marland Kitchen    REVIEW OF SYSTEMS:  Patient is not able to provide ROS  CODE STATUS: DNR   PAST MEDICAL HISTORY: Past Medical History  Diagnosis Date  . Dementia 2012  . Maxillary sinus fracture     after fall 2013, with LOC  . Hypertension 2001    on medicaitons for at least 10 years  . Other esophagitis 2011  . Personal history of tobacco use, presenting hazards to health 2011  . H/O cystitis 2010  . Arthritis 2005  . Rectal mass 2010  . Alzheimer disease   . Chronic kidney disease   . Anemia   . Alzheimer disease   . Kidney stone   . Hypokalemia   . Hepatic cyst     PAST SURGICAL HISTORY:  Past Surgical History  Procedure Laterality Date  . Breast reduction surgery  2003  . Breast lumpectomy  1985    Benign per pt, right breast  . Colonoscopy  2004, 2010,2011  . Breast reduction surgery  2003or 2004    by Dr. Genevieve Matthews  . Colon surgery  2010    polyps, Dr. Lemar Matthews, hemicolectomy  . Vein ligation and stripping  2005  . Wrist surgery  2005  . Upper gi endoscopy  2011  . Varicose veins  2011    laser surgery  . Cystoscopy/ureteroscopy/holmium laser/stent placement Left 01/04/2015    Procedure: CYSTOSCOPY/LEFTURETEROSCOPY/HOLMIUM LASER/LEFT STENT PLACEMENT/LEFT RETROGRADE;  Surgeon: Kelli Scotland, MD;  Location: [facility] ORS;  Service: Urology;  Laterality: Left;    Vital Signs: BP 134/54 mmHg  Pulse 100  Temp(Src) 98.6 F (37 C) (Oral)  Resp 16  Ht  (1.651 m)  Wt 58.378 kg (128 lb 11.2 oz)  BMI 21.42 kg/m2  SpO2 100% Filed Weights   02/01/15 0456 02/02/15 0500 02/03/15 0420  Weight: 58.015 kg (127 lb 14.4 oz) 57.924 kg (127 lb 11.2 oz) 58.378 kg (128 lb  11.2 oz)    Estimated body mass index is 21.42 kg/(m^2) as calculated from the following:   Height as of this encounter:  (1.651 m).   Weight as of this encounter: 58.378 kg (128 lb 11.2 oz).  PHYSICAL EXAM: General: NAD HEENT: OP clear, moist oral mucosa Neck: Trachea midline  Cardiovascular: RRR Pulmonary/Chest: Clear ant fields Abdominal: Soft, NTTP. Hypoactive bowel sounds GU: No SP tenderness, No CVA tenderness Extremities: No edema  Neurological: Grossly nonfocal, aphasic Skin: Warm, dry and intact.  Psychiatric: Calm and alert   LABS: CBC:    Component Value Date/Time   WBC 15.1* 02/03/2015 0432   WBC 10.0 10/09/2013 2109   HGB 9.6* 02/03/2015 0432   HGB 12.2 10/09/2013 2109   HCT 29.9* 02/03/2015 0432   HCT 36.7 10/09/2013 2109   PLT 722* 02/03/2015 0432   PLT 240 10/09/2013 2109   MCV 84.6 02/03/2015 0432   MCV 86 10/09/2013 2109   NEUTROABS 9.0* 01/22/2015 2115   NEUTROABS 7.6* 10/09/2013 2109   LYMPHSABS 1.0 01/22/2015 2115   LYMPHSABS 1.2 10/09/2013 2109   MONOABS 1.0* 01/22/2015 2115   MONOABS 0.7 10/09/2013 2109   EOSABS 0.0 01/22/2015 2115   EOSABS 0.2 10/09/2013 2109   BASOSABS 0.0 01/22/2015 2115   BASOSABS  0.1 10/09/2013 2109   Comprehensive Metabolic Panel:    Component Value Date/Time   NA 136 02/03/2015 0432   NA 139 10/09/2013 2109   K 4.6 02/03/2015 0432   K 3.4* 10/09/2013 2109   CL 106 02/03/2015 0432   CL 104 10/09/2013 2109   CO2 21* 02/03/2015 0432   CO2 27 10/09/2013 2109   BUN 41* 02/03/2015 0432   BUN 39* 10/09/2013 2109   CREATININE 1.15* 02/03/2015 0432   CREATININE 1.17 10/09/2013 2109   CREATININE 0.83 11/22/2011 1724   GLUCOSE 117* 02/03/2015 0432   GLUCOSE 88 10/09/2013 2109   CALCIUM 8.2* 02/03/2015 0432   CALCIUM 8.6 10/09/2013 2109   AST 49* 02/01/2015 0435   AST 35 10/09/2013 2109   ALT 84* 02/01/2015 0435   ALT 40 10/09/2013 2109   ALKPHOS 124 02/01/2015 0435   ALKPHOS 144* 10/09/2013 2109   BILITOT  0.5 02/01/2015 0435   PROT 6.1* 02/01/2015 0435   PROT 7.3 10/09/2013 2109   ALBUMIN 2.7* 02/01/2015 0435   ALBUMIN 3.5 10/09/2013 2109    IMPRESSION:  Ms. Kelli Matthews is a 74 year old female with a PMH of dementia, aphasia, HTN, s/p L uretral stent. Pt presented from SNF for diarrhea. ER workup significant for leukocytosis, dehydration and c-diff. Pt admitted for evaluation and treatment. Family not at bedside.  Pt with continued leukocytosis today.  Pt's calorie count reveals she is eating 90% of meals yesterday and drinking 100% of fluids given.  Pt is not hospice home appropriate.  Spoke with care management and social work about this.  Bed search for STR is ongoing.  Will speak to son as requested today to update.  PLAN: See above    More than 50% of the visit was spent in counseling/coordination of care: YES  Time Spent: 25 minutes

## 2015-02-03 NOTE — Clinical Social Work Note (Signed)
Clinical Social Worker spoke with pt's son to provide bed offers. CSW presented offers, pt's son chose [facility]. CSW updated [facility]' admissions coordinator. CSW also paged MD, 2x to update for possible discharge today. CSW will continue to follow.   Dede Query, MSW, LCSW Clinical Social Worker  [phone]

## 2015-02-03 NOTE — Progress Notes (Signed)
Calorie Count Note  Calorie count ordered. Results for 02/02/2015)  Diet: dysphagia 1 Supplements: magic cup and ensure enlive  Breakfast: 90% mightyshake, 75% eggs, 50% ensure enlive, sips orange juice, 50% yogurt and puree berries (530 kcals and 19 g of protein Lunch: 90% puree chicken, 90% puree green beans, 90% mashed potatoes, 50% 2% milk, 100% magic cup and 100% ensure enlive (1013 kcals and 47 g of protein) Dinner: 50% puree beef, 75% puree corn, 75% magic cup (422 kcals and 17 g of protein)    Total intake: 1965 kcal (137% of minimum estimated needs)  83 g protein (143% of minimum estimated needs)  Nutrition Dx: Swallowing difficulty related to dysphagia as evidenced by diet order, SLP evaluation  RN, Dedra reports pt did not eat breakfast this am (6/9) and RN planning to assist pt with lunch. Will follow-up in am regarding results of calorie count today.   High level care Kelli Matthews B. Freida Busman, RD, LDN [phone] (pager)

## 2015-02-03 NOTE — Clinical Social Work Note (Signed)
Clinical Social Worker was unable to reach MD regarding potential discharge today, after 4 pages total. CSW updated pt's son and the facility. Pt will likely be ready for discharge tomorrow to [facility]. CSW will continue to follow.   Dede Query, MSW, LCSW  Clinical Social Worker  [phone]

## 2015-02-03 NOTE — Progress Notes (Signed)
Patient ID: Kelli Matthews, female   DOB: 01-13-1941, 74 y.o.   MRN: [id number]  [facility] [place] at [facility]  PATIENT NAME: Kelli Matthews    MR#:  [id number]  DATE OF BIRTH:  [birth year]  SUBJECTIVE:  One loose stool today per RN. WBC count down to 14.5 today.  Non-verbal at baseline. NAD.  REVIEW OF SYSTEMS:  Review of Systems  Unable to perform ROS due to advanced dementia & non-verbal state. Had diarrhea per RN. DRUG ALLERGIES:   Allergies  Allergen Reactions  . Augmentin [Amoxicillin-Pot Clavulanate] Rash  . Latex Rash    VITALS:  Blood pressure 137/97, pulse 97, temperature 98.6 F (37 C), temperature source Oral, resp. rate 22, height  (1.651 m), weight 58.378 kg (128 lb 11.2 oz), SpO2 99 %.  PHYSICAL EXAMINATION:  Physical Exam  HENT:  Head: Normocephalic and atraumatic.  Eyes: Conjunctivae are normal. Pupils are equal, round, and reactive to light.  Neck: Normal range of motion. Neck supple. No tracheal deviation present. No thyromegaly present.  Cardiovascular: Normal rate and regular rhythm. Systolic murmur is present with a grade of 2/6  Pulmonary/Chest: Effort normal and breath sounds normal.  Abdominal: Soft. Bowel sounds are normal. There is no hepatosplenomegaly. There is no tenderness.  Musculoskeletal: Normal range of motion.       Right ankle: She exhibits no swelling.       Left ankle: She exhibits no swelling.  Neurological: not follow commands, unable to exam.  Skin: no rash or jaundice. Psychiatric:  Nonverbal at baseline, Flat affect.       LABORATORY PANEL:   CBC  Recent Labs Lab 02/03/15 0432  WBC 15.1*  HGB 9.6*  HCT 29.9*  PLT 722*   Chemistries   Recent Labs Lab 02/01/15 0435 02/02/15 0513 02/03/15 0432  NA 137 141 136  K 4.5 4.6 4.6  CL 111 111 106  CO2 20* 22 21*  GLUCOSE 114* 83 117*  BUN 31* 30* 41*  CREATININE 1.15* 1.14* 1.15*  CALCIUM 8.5* 9.0 8.2*  MG  --  2.0  --   AST 49*   --   --   ALT 84*  --   --   ALKPHOS 124  --   --   BILITOT 0.5  --   --     ASSESSMENT AND PLAN:   * Bacteremia with enterococcus due to urinary tract infection (presented on admission date). - was on Vanco and then as per ID desensitized with penicillin. - was having worsening diarrhea and switched from augmentin to oral amoxicillin 3 days ago.   *Clostridium difficile colitis - was on Flagyl,  Now on vancomycin po qid. - diarrhea improved and rectal tube was removed.  * left-sided nephrolithiasis-patient is status post left-sided ureteral stent placement about 3 weeks ago. No abdominal pain, N/V.  - Cr. Stable. Patient is followed by Dr. Vanna Scotland.  * hypokalemia - this is likely secondary to the diarrhea. - repleted and resolved  * abnormal LFTs-this could possibly be related to the underlying infection and diarrhea. -Abdominal ultrasound does not show any evidence of acute hepatobiliary pathology. - LFT's trending down and almost normal now.    * hypertension-presently hemodynamically stable. Continue metoprolol.  * fungal infection-this is located in bilateral groin area. Continue nystatin cream/powder.  * dementia-continue Aricept, Seroquel. -Avoid benzodiazepines.  * depression-continue Celexa.  * Leukocytosis - possible Related to c. Diff colitis.  Trending down.  Will hold of  on imaging at this time as suggested by ID.  Hemodynamically stable.  - follow WBC count.    * Dehydration. Start NS iv, f/u BMP.  Per Palliative care Brett Canales who discussed with the patient's FM, the patient is not appropriate for hospice care at this time, but need nursing home placement.  All the records are reviewed and case discussed with Care Management/Social Workerr. Management plans discussed with the RN, case manager and Palliative care Brett Canales.  CODE STATUS: DNR  DVT Prophylaxis:  Heparin SQ.   TOTAL TIME TAKING CARE OF THIS PATIENT: 35 minutes.   POSSIBLE D/C to SNF IIN  1-2 DAYS, DEPENDING ON CLINICAL CONDITION.  Likely needs SNF as per PT eval and CM working on placement.    Shaune Pollack M.D on 02/03/2015 at 2:12 PM  Between 7am to 6pm - Pager - [phone]  After 6pm go to [email or web address] - Scientist, research (life sciences) Hospitalists  Office  [phone]

## 2015-02-03 NOTE — Clinical Social Work Placement (Signed)
   CLINICAL SOCIAL WORK PLACEMENT  NOTE  Date:  02/03/2015  Patient Details  Name: Kelli Matthews MRN: [id number] Date of Birth: [birth year]  Clinical Social Work is seeking post-discharge placement for this patient at the Skilled  Nursing Facility level of care (*CSW will initial, date and re-position this form in  chart as items are completed):  Yes   Patient/family provided with [place] Clinical Social Work Department's list of facilities offering this level of care within the geographic area requested by the patient (or if unable, by the patient's family).  Yes   Patient/family informed of their freedom to choose among providers that offer the needed level of care, that participate in Medicare, Medicaid or managed care program needed by the patient, have an available bed and are willing to accept the patient.  Yes   Patient/family informed of [facility]'s ownership interest in [facility] and [facility], as well as of the fact that they are under no obligation to receive care at these facilities.  PASRR submitted to EDS on 02/03/15     PASRR number received on 02/03/15     Existing PASRR number confirmed on       FL2 transmitted to all facilities in geographic area requested by pt/family on 02/01/15     FL2 transmitted to all facilities within larger geographic area on       Patient informed that his/her managed care company has contracts with or will negotiate with certain facilities, including the following:            Patient/family informed of bed offers received.  Patient chooses bed at       Physician recommends and patient chooses bed at      Patient to be transferred to   on  .  Patient to be transferred to facility by       Patient family notified on   of transfer.  Name of family member notified:        PHYSICIAN       Additional Comment:    _______________________________________________ Dede Query, LCSW 02/03/2015, 12:03 PM

## 2015-02-03 NOTE — Progress Notes (Signed)
Speech Language Pathology Treatment: Dysphagia  Patient Details Name: SUNITA FADEN MRN: [id number] DOB: 1941-06-29 Today's Date: 02/03/2015 Time: 1520-1600 SLP Time Calculation (min) (ACUTE ONLY): 40 min  Assessment / Plan / Recommendation Clinical Impression  Pt appears to be presenting w/ inconsistency w/ oral intake, and inconsistency in toleration of thin liquids. Pt is at increased risk for aspiration sec. To Cognitive status and has exhibited overt coughing w/ liquids w/ NSG today. Pt was seen w/ trials of Nectar liquids which she tolerated well w/out overt s/s of aspiration. Rec. Diet modification at this time to Dys. I w/ nectar liquids to reduce risk for aspiration. NSG updated and agreed. Pt is d/t discharge to SNF per CM. ST will f/u w/ toleration of diet next 1-2 days.    HPI Other Pertinent Information: pt is mostly nonverbal except to say "uh-huh" to questions. Pt requires feeding. NSG reported pt did not take any po's at breakfast - too sleepy. Pt has only taken magic cup for lunch meal. NSG attempted to feed some at lunch meal but reported coughing w/ liquids.    Pertinent Vitals Pain Assessment: No/denies pain  SLP Plan  Continue with current plan of care    Recommendations Diet recommendations: Dysphagia 1 (puree);Nectar-thick liquid Liquids provided via: Cup;Straw Medication Administration: Crushed with puree Supervision: Full supervision/cueing for compensatory strategies Compensations: Slow rate;Small sips/bites Postural Changes and/or Swallow Maneuvers: Seated upright 90 degrees              Oral Care Recommendations: Oral care BID;Oral care before and after PO;Staff/trained caregiver to provide oral care Follow up Recommendations: Skilled Nursing facility (w/ Hospice services per report) Plan: Continue with current plan of care    GO     Eldana Isip 02/03/2015, 4:10 PM

## 2015-02-03 NOTE — Telephone Encounter (Signed)
Spoke with patient's son and he states that patient is still in the hospital and they are not sure if she will be discharged or not. Patient is scheduled to have an I&O cath UA &CX done on 02-07-15 and surgery on 6-20 but he is not sure if she will be able to do this. He wanted to know if you would stop by her room to see if she is ok for surgery?

## 2015-02-04 ENCOUNTER — Telehealth: Payer: Self-pay | Admitting: Internal Medicine

## 2015-02-04 LAB — CBC
HCT: 26.1 % — ABNORMAL LOW (ref 35.0–47.0)
HEMOGLOBIN: 8.1 g/dL — AB (ref 12.0–16.0)
MCH: 26.7 pg (ref 26.0–34.0)
MCHC: 31.2 g/dL — AB (ref 32.0–36.0)
MCV: 85.4 fL (ref 80.0–100.0)
PLATELETS: 706 10*3/uL — AB (ref 150–440)
RBC: 3.05 MIL/uL — ABNORMAL LOW (ref 3.80–5.20)
RDW: 16.3 % — AB (ref 11.5–14.5)
WBC: 15.9 10*3/uL — AB (ref 3.6–11.0)

## 2015-02-04 LAB — BASIC METABOLIC PANEL
Anion gap: 5 (ref 5–15)
BUN: 36 mg/dL — AB (ref 6–20)
CO2: 21 mmol/L — AB (ref 22–32)
Calcium: 7.9 mg/dL — ABNORMAL LOW (ref 8.9–10.3)
Chloride: 116 mmol/L — ABNORMAL HIGH (ref 101–111)
Creatinine, Ser: 0.94 mg/dL (ref 0.44–1.00)
GFR, EST NON AFRICAN AMERICAN: 59 mL/min — AB (ref >60)
GLUCOSE: 116 mg/dL — AB (ref 65–99)
Potassium: 4.4 mmol/L (ref 3.5–5.1)
Sodium: 142 mmol/L (ref 135–145)

## 2015-02-04 MED ORDER — VANCOMYCIN 50 MG/ML ORAL SOLUTION: 250.0000 mg | Freq: Four times a day (QID) | ORAL | Status: DC

## 2015-02-04 MED ORDER — CITALOPRAM HYDROBROMIDE 20 MG PO TABS: 20.0000 mg | ORAL_TABLET | Freq: Every day | ORAL | Status: AC

## 2015-02-04 MED ORDER — AMOXICILLIN 500 MG PO CAPS: 500.0000 mg | ORAL_CAPSULE | Freq: Three times a day (TID) | ORAL | Status: DC

## 2015-02-04 NOTE — Discharge Summary (Signed)
[facility] - [place] at [facility]   PATIENT NAME: Kelli Matthews    MR#:  [id number]  DATE OF BIRTH:  1941-07-30  DATE OF ADMISSION:  01/22/2015 ADMITTING PHYSICIAN: Crissie Figures, MD  DATE OF DISCHARGE: 02/04/2015 PRIMARY CARE PHYSICIAN: Wynona Dove, MD    ADMISSION DIAGNOSIS:  Hypokalemia [E87.6] Acute biliary pancreatitis [K85.1] Hematuria [R31.9]   DISCHARGE DIAGNOSIS:  Bacteremia with enterococcus due to urinary tract infection Clostridium difficile colitis left-sided nephrolithiasis Hypokalemia Dehydration  SECONDARY DIAGNOSIS:   Past Medical History  Diagnosis Date  . Dementia 2012  . Maxillary sinus fracture     after fall 2013, with LOC  . Hypertension 2001    on medicaitons for at least 10 years  . Other esophagitis 2011  . Personal history of tobacco use, presenting hazards to health 2011  . H/O cystitis 2010  . Arthritis 2005  . Rectal mass 2010  . Alzheimer disease   . Chronic kidney disease   . Anemia   . Alzheimer disease   . Kidney stone   . Hypokalemia   . Hepatic cyst     HOSPITAL COURSE:   Kelli Matthews is a 74 y.o. female with a known history of [name], resident of memory unit assisted living facility brought in with the complaints of diarrhea 4 today. According to the patient's daughter who is with the patient at this time nursing staff reported 4 episodes of loose stools which is not bloody. Patient has chronic dementia and is basically noncommunicative and on total care dependent. Per patient's daughter she was having some fevers for the past 2-3 days and this morning when she saw her mother, her mother was in her usual state of health without any fever. Later on in the evening nursing staff notified of patient's diarrhea. There is also a question of transient period of altered sensorium. EMS was called, site and the patient was brought to the emergency room for further evaluation. Patient on  arrival to the ED was found to be alert and awake but noncommunicative because of baseline dementia with stable vitals. Workup revealed severe hypokalemia with production of 2.1, markedly elevated LFTs, elevated lipase of 164. After obtaining blood and urine cultures, patient was started on IV ceftriaxone and Flagyl by the ED physician. Abdominal ultrasound was ordered which is pending at this time. Urinalysis shows some trace bacteria with a WBC of 6-30. Of note patient had a urological procedure with stent placement about 2 weeks ago for left ureteric stone and was also treated for a UTI with by mouth Bactrim. Patient had a transient episode of hypotension in the ED for which she received normal saline bolus 2 and blood pressure is currently maintaining in the normal range.   * Bacteremia with enterococcus due to urinary tract infection (presented on admission date). The patient was on Vanco and then as per ID desensitized with penicillin and  switched from augmentin to oral amoxicillin and need to take until February 05, 2015 per Dr. Sampson Goon.    *Clostridium difficile colitis - was on Flagyl, but was changed to vancomycin po qid due to worsening diarreha. Diarrhea improved. The patient need to take vancomycin until February 15, 2015 per Dr. Sampson Goon.  * left-sided nephrolithiasis-patient is status post left-sided ureteral stent placement. No abdominal pain,  followed up Dr. Vanna Scotland as outpatient.  * hypokalemia, secondary to the diarrhea, repleted and resolved  * abnormal LFTs. is could possibly be related to the underlying infection and  diarrhea, which is resolved. -Abdominal ultrasound does not show any evidence of acute hepatobiliary pathology.   * Leukocytosis - possible Related to c. Diff colitis. Trending down, follow WBC as outpatient.   * Dehydration. Treated with NS iv, improving.   Clinically stable, discharge to SNF today.  DISCHARGE CONDITIONS:   Stable.  CONSULTS  OBTAINED:  Treatment Team:  Clydie Braun, MD Vanna Scotland, MD Shaune Pollack, MD  DRUG ALLERGIES:   Allergies  Allergen Reactions  . Augmentin [Amoxicillin-Pot Clavulanate] Rash  . Latex Rash    DISCHARGE MEDICATIONS:   Current Discharge Medication List    START taking these medications   Details  amoxicillin (AMOXIL) 500 MG capsule Take 1 capsule (500 mg total) by mouth every 8 (eight) hours. Qty: 4 capsule, Refills: 0    vancomycin (VANCOCIN) 50 mg/mL oral solution Take 5 mLs (250 mg total) by mouth every 6 (six) hours. Qty: 230 mL, Refills: 0      CONTINUE these medications which have CHANGED   Details  citalopram (CELEXA) 20 MG tablet Take 1 tablet (20 mg total) by mouth daily. Qty: 30 tablet, Refills: 0      CONTINUE these medications which have NOT CHANGED   Details  acetaminophen (TYLENOL) 325 MG tablet Take 650 mg by mouth every 4 (four) hours as needed for mild pain or fever.    aspirin EC 81 MG tablet Take 81 mg by mouth daily.    cholecalciferol (VITAMIN D) 1000 UNITS tablet TAKE ONE TABLET BY MOUTH EACH DAY. Qty: 30 tablet, Refills: PRN    donepezil (ARICEPT) 10 MG tablet TAKE ONE TABLET BY MOUTH EACH DAY AT BEDTIME. Qty: 30 tablet, Refills: 11    fluticasone (FLONASE) 50 MCG/ACT nasal spray Two sprays in each nostril daily. Qty: 16 g, Refills: 6    lisinopril-hydrochlorothiazide (PRINZIDE,ZESTORETIC) 10-12.5 MG per tablet Take 1 tablet by mouth daily.    memantine (NAMENDA) 10 MG tablet Take 10 mg by mouth 2 (two) times daily.    metoprolol succinate (TOPROL-XL) 25 MG 24 hr tablet Take 1 tablet (25 mg total) by mouth daily. Qty: 30 tablet, Refills: 6    mirtazapine (REMERON) 15 MG tablet Take 30 mg by mouth at bedtime.     Multiple Vitamins-Minerals (THERAVIM-M) TABS TAKE ONE TABLET BY MOUTH EACH DAY. Qty: 30 tablet, Refills: 5    !! QUEtiapine (SEROQUEL) 50 MG tablet Take 50 mg by mouth at bedtime.    colestipol (COLESTID) 1 G tablet Take 1  g by mouth 2 (two) times daily as needed.     desonide (DESOWEN) 0.05 % cream Apply 1 application topically as needed (to face).     gentamicin cream (GARAMYCIN) 0.1 % APPLY TWICE DAILY TO SKIN LESIONS ON UPPER BACK Qty: 30 g, Refills: 1    Infant Care Products (DERMACLOUD) CREA Apply 1 application topically as needed (apply to bottom).     !! QUEtiapine (SEROQUEL) 25 MG tablet Take 25 mg by mouth 3 (three) times daily as needed (agitation).    triamcinolone cream (KENALOG) 0.1 % Apply 1 application topically as needed (apply to upper back and chest as needed for redness).      !! - Potential duplicate medications found. Please discuss with provider.    STOP taking these medications     HYDROcodone-acetaminophen (NORCO/VICODIN) 5-325 MG per tablet      loperamide (IMODIUM A-D) 2 MG tablet      psyllium (METAMUCIL) 58.6 % powder      ciprofloxacin (CIPRO)  250 MG tablet          DISCHARGE INSTRUCTIONS:      If you experience worsening of your admission symptoms, develop shortness of breath, life threatening emergency, suicidal or homicidal thoughts you must seek medical attention immediately by calling 911 or calling your MD immediately  if symptoms less severe.  You Must read complete instructions/literature along with all the possible adverse reactions/side effects for all the Medicines you take and that have been prescribed to you. Take any new Medicines after you have completely understood and accept all the possible adverse reactions/side effects.   Please note  You were cared for by a hospitalist during your hospital stay. If you have any questions about your discharge medications or the care you received while you were in the hospital after you are discharged, you can call the unit and asked to speak with the hospitalist on call if the hospitalist that took care of you is not available. Once you are discharged, your primary care physician will handle any further medical  issues. Please note that NO REFILLS for any discharge medications will be authorized once you are discharged, as it is imperative that you return to your primary care physician (or establish a relationship with a primary care physician if you do not have one) for your aftercare needs so that they can reassess your need for medications and monitor your lab values.    Today   SUBJECTIVE   Demented.   VITAL SIGNS:  Blood pressure 138/62, pulse 90, temperature 98.7 F (37.1 C), temperature source Oral, resp. rate 16, height 5\' 5"  (1.651 m), weight 59.013 kg (130 lb 1.6 oz), SpO2 100 %.  I/O:   Intake/Output Summary (Last 24 hours) at 02/04/15 1043 Last data filed at 02/03/15 1835  Gross per 24 hour  Intake  222.5 ml  Output      0 ml  Net  222.5 ml    PHYSICAL EXAMINATION:  GENERAL:  74 y.o.-year-old patient lying in the bed with no acute distress.  EYES: Pupils equal, round, reactive to light and accommodation. No scleral icterus. Extraocular muscles intact.  HEENT: Head atraumatic, normocephalic. Oropharynx and nasopharynx clear.  NECK:  Supple, no jugular venous distention. No thyroid enlargement, no tenderness.  LUNGS: Normal breath sounds bilaterally, no wheezing, rales,rhonchi or crepitation. No use of accessory muscles of respiration.  CARDIOVASCULAR: S1, S2 normal. No murmurs, rubs, or gallops.  ABDOMEN: Soft, non-tender, non-distended. Bowel sounds present. No organomegaly or mass.  EXTREMITIES: No pedal edema, cyanosis, or clubbing.  NEUROLOGIC: not follow commands. PSYCHIATRIC: demented. SKIN: No obvious rash, lesion, or ulcer.   DATA REVIEW:   CBC  Recent Labs Lab 02/04/15 0543  WBC 15.9*  HGB 8.1*  HCT 26.1*  PLT 706*    Chemistries   Recent Labs Lab 02/01/15 0435 02/02/15 0513  02/04/15 0543  NA 137 141  < > 142  K 4.5 4.6  < > 4.4  CL 111 111  < > 116*  CO2 20* 22  < > 21*  GLUCOSE 114* 83  < > 116*  BUN 31* 30*  < > 36*  CREATININE 1.15*  1.14*  < > 0.94  CALCIUM 8.5* 9.0  < > 7.9*  MG  --  2.0  --   --   AST 49*  --   --   --   ALT 84*  --   --   --   ALKPHOS 124  --   --   --  BILITOT 0.5  --   --   --   < > = values in this interval not displayed.  Cardiac Enzymes No results for input(s): TROPONINI in the last 168 hours.  Microbiology Results  Results for orders placed or performed during the hospital encounter of 01/22/15  C difficile quick scan w PCR reflex [facility])     Status: None   Collection Time: 01/22/15  9:58 PM  Result Value Ref Range Status   C Diff antigen POSITIVE  Final   C Diff toxin NEGATIVE  Final   C Diff interpretation REFLEX TO PCR  Final  Urine culture     Status: None   Collection Time: 01/22/15  9:58 PM  Result Value Ref Range Status   Specimen Description URINE, RANDOM  Final   Special Requests Normal  Final   Culture >=100,000 COLONIES/mL ENTEROCOCCUS RAFFINOSUS  Final   Report Status 01/31/2015 FINAL  Final   Organism ID, Bacteria ENTEROCOCCUS RAFFINOSUS  Final      Susceptibility   Enterococcus raffinosus - MIC (ETEST)*    AMPICILLIN SENSITIVE Sensitive     VANCOMYCIN SENSITIVE Sensitive     LEVOFLOXACIN RESISTANT Resistant     LINEZOLID INTERMEDIATE Intermediate     * >=100,000 COLONIES/mL ENTEROCOCCUS RAFFINOSUS  Clostridium Difficile by PCR (not at [facility])     Status: Abnormal   Collection Time: 01/22/15  9:58 PM  Result Value Ref Range Status   C difficile by pcr POSITIVE (A) NEGATIVE Final    Comment: CRITICAL RESULT CALLED TO, READ BACK BY AND VERIFIED WITH: LUIS FLORES AT 2336 01/22/15.PMH   Culture, blood (routine x 2)     Status: None   Collection Time: 01/22/15 10:53 PM  Result Value Ref Range Status   Specimen Description BLOOD  Final   Special Requests Normal  Final   Culture  Setup Time   Final    GRAM POSITIVE DIPLOCOCCI AEROBIC BOTTLE ONLY CRITICAL RESULT CALLED TO, READ BACK BY AND VERIFIED WITH: Acquanetta Sit [facility] AT 2114 01/23/15.PMH    Culture   Final     ENTEROCOCCUS RAFFINOSUS AEROBIC BOTTLE ONLY REFER TO OTHER SET FOR SENSITIVITIES     Report Status 01/27/2015 FINAL  Final  Culture, blood (routine x 2)     Status: None   Collection Time: 01/22/15 10:53 PM  Result Value Ref Range Status   Specimen Description BLOOD  Final   Special Requests Normal  Final   Culture  Setup Time   Final    GRAM POSITIVE DIPLOCOCCI IN BOTH AEROBIC AND ANAEROBIC BOTTLES CRITICAL RESULT CALLED TO, READ BACK BY AND VERIFIED WITH: CALLED TO SHAE BREWER ON 01/22/18 AT 1820 BY JEF GRAM POSITIVE DIPLOCOCCI CRITICAL RESULT CALLED TO, READ BACK BY AND VERIFIED WITH: Acquanetta Sit [facility] AT 2114 01/23/15.PMH    Culture   Final    ENTEROCOCCUS RAFFINOSUS IN BOTH AEROBIC AND ANAEROBIC BOTTLES    Report Status 01/27/2015 FINAL  Final   Organism ID, Bacteria ENTEROCOCCUS RAFFINOSUS  Final      Susceptibility   Enterococcus raffinosus - MIC (ETEST)*    AMPICILLIN Value in next row Sensitive      SENSITIVE8.0    VANCOMYCIN Value in next row Sensitive      SENSITIVE1.5    LEVOFLOXACIN Value in next row Resistant      RESISTANT>32    LINEZOLID Value in next row Sensitive      SENSITIVE2.0    * ENTEROCOCCUS RAFFINOSUS  Stool culture  Status: None   Collection Time: 01/24/15  2:52 AM  Result Value Ref Range Status   Specimen Description STOOL  Final   Special Requests NONE  Final   Culture   Final    NO SALMONELLA OR SHIGELLA ISOLATED No Pathogenic E. coli detected NO CAMPYLOBACTER DETECTED    Report Status 01/27/2015 FINAL  Final  Culture, blood (routine x 2)     Status: None   Collection Time: 01/25/15  3:20 PM  Result Value Ref Range Status   Specimen Description BLOOD  Final   Special Requests BLOOD  Final   Culture NO GROWTH 5 DAYS  Final   Report Status 01/30/2015 FINAL  Final  Culture, blood (routine x 2)     Status: None   Collection Time: 01/25/15  3:25 PM  Result Value Ref Range Status   Specimen Description BLOOD  Final   Special Requests BLOOD   Final   Culture NO GROWTH 5 DAYS  Final   Report Status 01/30/2015 FINAL  Final    RADIOLOGY:  No results found.      Management plans discussed with the patient, family and they are in agreement.  CODE STATUS:     Code Status Orders        Start     Ordered   01/23/15 0100  Do not attempt resuscitation (DNR)   Continuous    Question Answer Comment  In the event of cardiac or respiratory ARREST Do not call a "code blue"   In the event of cardiac or respiratory ARREST Do not perform Intubation, CPR, defibrillation or ACLS   In the event of cardiac or respiratory ARREST Use medication by any route, position, wound care, and other measures to relive pain and suffering. May use oxygen, suction and manual treatment of airway obstruction as needed for comfort.      01/23/15 0100    Advance Directive Documentation        Most Recent Value   Type of Advance Directive  Living will   Pre-existing out of facility DNR order (yellow form or pink MOST form)     "MOST" Form in Place?        TOTAL TIME TAKING CARE OF THIS PATIENT: 43 minutes.    Shaune Pollack M.D on 02/04/2015 at 10:43 AM  Between 7am to 6pm - Pager - [phone]  After 6pm go to [email or web address] - password [email or web address] [facility]  [place] Hospitalists  Office  [phone]  CC: Primary care physician; Wynona Dove, MD

## 2015-02-04 NOTE — Discharge Instructions (Signed)
Low sodium diet ° °Activity as tolerated °

## 2015-02-04 NOTE — Progress Notes (Signed)
Physical Therapy Treatment Patient Details Name: Kelli Matthews MRN: [id number] DOB: 12-06-1940 Today's Date: 02/04/2015    History of Present Illness Kelli Matthews is a 74 y.o. female with a known history of [name], resident of memory unit assisted living facility brought in with the complaints of diarrhea 4 today. She was admitted with c-diff, severe hypokalemia; She also recently was treated for UTI a few weeks ago. Patient is non-verbal at baseline and has difficulty following commands.  Has experienced prolonged hospital stay, currently under evaluation for potential abscess; concern for decline in functional strength and ability.    PT Comments    Pt is making gradual progress towards goals with improved tolerance for activity. Pt able to tolerate there-ex this date, however has difficulty following commands/cues. Pt able to stand this date several attempts, however not able to progress further mobility secondary to poor balance. Pt also completed rolling in bed for linen change with mod assist + 2.  Follow Up Recommendations  SNF     Equipment Recommendations  Rolling walker with 5" wheels    Recommendations for Other Services       Precautions / Restrictions Precautions Precautions: Fall Restrictions Weight Bearing Restrictions: No    Mobility  Bed Mobility Overal bed mobility: Needs Assistance Bed Mobility: Supine to Sit;Sit to Supine     Supine to sit: Mod assist Sit to supine: Max assist   General bed mobility comments: All bed mobility performed with pt able to assist with B UE. mod/max assist required for completion of transfer. Once seated at EOB, pt with heavy R side leaning, however able to self correct with min assist and cues. Pt then able to sit at EOB for approx 5 minutes, then fatigues.  Transfers Overall transfer level: Needs assistance Equipment used: None Transfers: Sit to/from Stand Sit to Stand: Mod assist         General  transfer comment: 2 attempts for sit<>Stand with HHA and mod assist. Pt with heavy post leaning noted, unable to weight shift and stand upright. Unable to tolerate further activity at this time.   Ambulation/Gait Ambulation/Gait assistance:  (unable at this time)               Information systems manager [employer] (Stroke Patients Only)       Balance                                    Cognition Arousal/Alertness: Awake/alert Behavior During Therapy: Flat affect Overall Cognitive Status: History of cognitive impairments - at baseline       Memory: Decreased recall of precautions;Decreased short-term memory              Exercises Other Exercises Other Exercises: supine/seated ther-ex performed on B UE/LE including resisted elbow flexion/extension; shoulder raises, SLRs, SAQ, and LAQ. All ther-ex performed with min assist and 10 reps. Heavy cues given for encouragement and participation. Seated forward reaching attempted, however pt unable to follow commands.    General Comments        Pertinent Vitals/Pain Pain Assessment: No/denies pain    Home Living                      Prior Function            PT Goals (current goals  can now be found in the care plan section) Acute Rehab PT Goals Patient Stated Goal: Unable to verbalize PT Goal Formulation: With patient Time For Goal Achievement: 02/15/15 Potential to Achieve Goals: Fair Progress towards PT goals: Progressing toward goals    Frequency  Min 2X/week    PT Plan Current plan remains appropriate    Co-evaluation             End of Session Equipment Utilized During Treatment: Gait belt Activity Tolerance: Patient tolerated treatment well Patient left: in bed;with call bell/phone within reach;with nursing/sitter in room     Time: 1132-1156 PT Time Calculation (min) (ACUTE ONLY): 24 min  Charges:  $Therapeutic Exercise: 8-22  mins $Therapeutic Activity: 8-22 mins                    G Codes:      Kyson Kupper 10-Feb-2015, 1:27 PM  Elizabeth Palau, PT, DPT [phone]

## 2015-02-04 NOTE — Clinical Social Work Placement (Signed)
   CLINICAL SOCIAL WORK PLACEMENT  NOTE  Date:  02/04/2015  Patient Details  Name: Kelli Matthews MRN: [id number] Date of Birth: 01/02/1941  Clinical Social Work is seeking post-discharge placement for this patient at the Skilled  Nursing Facility level of care (*CSW will initial, date and re-position this form in  chart as items are completed):  Yes   Patient/family provided with [facility] [place] Clinical Social Work Department's list of facilities offering this level of care within the geographic area requested by the patient (or if unable, by the patient's family).  Yes   Patient/family informed of their freedom to choose among providers that offer the needed level of care, that participate in Medicare, Medicaid or managed care program needed by the patient, have an available bed and are willing to accept the patient.  Yes   Patient/family informed of [facility]'s ownership interest in [facility] and [facility], as well as of the fact that they are under no obligation to receive care at these facilities.  PASRR submitted to EDS on 02/03/15     PASRR number received on 02/03/15     Existing PASRR number confirmed on       FL2 transmitted to all facilities in geographic area requested by pt/family on 02/01/15     FL2 transmitted to all facilities within larger geographic area on       Patient informed that his/her managed care company has contracts with or will negotiate with certain facilities, including the following:        Yes   Patient/family informed of bed offers received.  Patient chooses bed at  [facility])     Physician recommends and patient chooses bed at  [facility])    Patient to be transferred to  [facility]) on 02/04/15.  Patient to be transferred to facility by  [facility])     Patient family notified on 02/04/15 of transfer.  Name of family member notified:   [name], son)     PHYSICIAN       Additional Comment:     _______________________________________________ Dede Query, LCSW 02/04/2015, 2:30 PM

## 2015-02-04 NOTE — Clinical Social Work Note (Signed)
Pt is ready for discharge today to [facility]. Faciltiy has received discharge information. Pt's son is aware and agreeable to discharge plan. He will be calling facility to address admissions paperwork. RN will call report and EMS will provide transportation. CSW is signing off as no further needs identified.   Dede Query, MSW, LCSW Clinical Social Worker  [phone]

## 2015-02-04 NOTE — Telephone Encounter (Signed)
HFU-c diff, d/c 6/10. Pt has follow up scheduled for 6/13.msn

## 2015-02-04 NOTE — Telephone Encounter (Addendum)
Appt within 2 business days, no call needed. Will call pt 02/07/15 to confirm she will keep appt

## 2015-02-04 NOTE — Progress Notes (Signed)
Report called to [facility], Patient discharged via non emergency transport to [facility].

## 2015-02-04 NOTE — Plan of Care (Signed)
Problem: Discharge Progression Outcomes Goal: Other Discharge Outcomes/Goals Outcome: Progressing Plan of Care Progress to Goals: Hemo:  Pt non-verbal at baseline. IV fluids continue. Pt continues to have diarrhea. Enteric precautions continue. Pt's peri area continues to be red and excoriated. Nystatin cream applied as ordered. Pt continues to receive PO vanc and amoxicillin.  Pain: No s/s of pain noted through out the shift.  Activity: Pt turned and repositioned q2hrs as indicated.

## 2015-02-07 ENCOUNTER — Ambulatory Visit: Payer: Medicare Other | Admitting: Internal Medicine

## 2015-02-07 ENCOUNTER — Ambulatory Visit: Payer: Self-pay

## 2015-02-07 ENCOUNTER — Telehealth: Payer: Self-pay | Admitting: Urology

## 2015-02-07 NOTE — Telephone Encounter (Signed)
Spoke to pt's son, to confirm keeping appt this afternoon. He states she was discharged to [facility] 02/04/15, so will not be keeping appt for today, appt canceled. Advised to call office when she is discharged from SNF,  verbalized understanding FYI

## 2015-02-07 NOTE — Telephone Encounter (Signed)
Spoke with patient's son in regards to her discharge on Friday and stated that we would have [facility] do the cath urine culture and determine at the end of the week based on those results and patient's general health wether or not we would go ahead with surgery or if we would need to reschedule to 02-23-15.  Spoke with Aram Beecham at [facility] and gave a verbal order for a cath urine culture to be done today and for the results to be faxed to our office for review.  [facility] [phone]

## 2015-02-11 ENCOUNTER — Telehealth: Payer: Self-pay | Admitting: Urology

## 2015-02-11 NOTE — Telephone Encounter (Addendum)
I received a phone call from her son Mr. Vedia Pereyra [facility]) and he decided to not proceed with his mothers surgery, because he felt like she was possible in her end stages. I informed him that Dr. Apolinar Junes recommended that he still bring his mother in to get the stent removed so she could be more comfortable. He verbally stated understanding, but ask to call back because he was currently in the shower.        I called and left a message on the pt sons vm to return my call. I need to notify him that we are going to proceed w/ surgery on Monday, June 20th depending on the results of his mother urine culture. I also need to remind him that he needs to call same day surgery @ [phone] the day before surgery between the hours of 1-3pm.

## 2015-02-14 ENCOUNTER — Ambulatory Visit: Admission: RE | Admit: 2015-02-14 | Payer: Medicare Other | Source: Ambulatory Visit | Admitting: Urology

## 2015-02-14 ENCOUNTER — Encounter: Admission: RE | Payer: Self-pay | Source: Ambulatory Visit

## 2015-02-14 SURGERY — URETEROSCOPY, WITH LITHOTRIPSY USING HOLMIUM LASER
Anesthesia: Choice | Laterality: Left

## 2015-02-17 ENCOUNTER — Ambulatory Visit
Admission: RE | Admit: 2015-02-17 | Discharge: 2015-02-17 | Disposition: A | Discharge status: Home / Self Care | Payer: Medicare Other | Source: Ambulatory Visit | Attending: Urology | Admitting: Urology

## 2015-02-17 ENCOUNTER — Encounter: Payer: Self-pay | Admitting: *Deleted

## 2015-02-17 ENCOUNTER — Encounter: Payer: Self-pay | Admitting: Urology

## 2015-02-17 ENCOUNTER — Ambulatory Visit (INDEPENDENT_AMBULATORY_CARE_PROVIDER_SITE_OTHER): Payer: Medicare Other | Admitting: Urology

## 2015-02-17 ENCOUNTER — Observation Stay: Payer: Medicare Other

## 2015-02-17 ENCOUNTER — Observation Stay
Admission: AD | Admit: 2015-02-17 | Discharge: 2015-02-18 | Disposition: A | Discharge status: Skilled Nursing Facility | Payer: Medicare Other | Source: Ambulatory Visit | Attending: Urology | Admitting: Urology

## 2015-02-17 ENCOUNTER — Other Ambulatory Visit: Payer: Self-pay | Admitting: Urology

## 2015-02-17 VITALS — BP 102/65 | HR 83

## 2015-02-17 DIAGNOSIS — Z79899 Other long term (current) drug therapy: Secondary | ICD-10-CM | POA: Diagnosis not present

## 2015-02-17 DIAGNOSIS — Z960 Presence of urogenital implants: Secondary | ICD-10-CM | POA: Diagnosis not present

## 2015-02-17 DIAGNOSIS — Z7982 Long term (current) use of aspirin: Secondary | ICD-10-CM | POA: Diagnosis not present

## 2015-02-17 DIAGNOSIS — Z818 Family history of other mental and behavioral disorders: Secondary | ICD-10-CM | POA: Diagnosis not present

## 2015-02-17 DIAGNOSIS — F0281 Dementia in other diseases classified elsewhere with behavioral disturbance: Secondary | ICD-10-CM | POA: Insufficient documentation

## 2015-02-17 DIAGNOSIS — Z87891 Personal history of nicotine dependence: Secondary | ICD-10-CM | POA: Insufficient documentation

## 2015-02-17 DIAGNOSIS — Z91040 Latex allergy status: Secondary | ICD-10-CM | POA: Diagnosis not present

## 2015-02-17 DIAGNOSIS — E785 Hyperlipidemia, unspecified: Secondary | ICD-10-CM | POA: Insufficient documentation

## 2015-02-17 DIAGNOSIS — Z86010 Personal history of colonic polyps: Secondary | ICD-10-CM | POA: Diagnosis not present

## 2015-02-17 DIAGNOSIS — N210 Calculus in bladder: Secondary | ICD-10-CM | POA: Diagnosis not present

## 2015-02-17 DIAGNOSIS — Z7951 Long term (current) use of inhaled steroids: Secondary | ICD-10-CM | POA: Insufficient documentation

## 2015-02-17 DIAGNOSIS — N200 Calculus of kidney: Secondary | ICD-10-CM

## 2015-02-17 DIAGNOSIS — Z466 Encounter for fitting and adjustment of urinary device: Secondary | ICD-10-CM | POA: Diagnosis present

## 2015-02-17 DIAGNOSIS — M1990 Unspecified osteoarthritis, unspecified site: Secondary | ICD-10-CM | POA: Insufficient documentation

## 2015-02-17 DIAGNOSIS — Z842 Family history of other diseases of the genitourinary system: Secondary | ICD-10-CM | POA: Diagnosis not present

## 2015-02-17 DIAGNOSIS — I10 Essential (primary) hypertension: Secondary | ICD-10-CM | POA: Insufficient documentation

## 2015-02-17 DIAGNOSIS — Z823 Family history of stroke: Secondary | ICD-10-CM | POA: Diagnosis not present

## 2015-02-17 DIAGNOSIS — Z881 Allergy status to other antibiotic agents status: Secondary | ICD-10-CM | POA: Insufficient documentation

## 2015-02-17 DIAGNOSIS — G479 Sleep disorder, unspecified: Secondary | ICD-10-CM | POA: Diagnosis not present

## 2015-02-17 DIAGNOSIS — R7989 Other specified abnormal findings of blood chemistry: Secondary | ICD-10-CM | POA: Diagnosis not present

## 2015-02-17 DIAGNOSIS — E876 Hypokalemia: Secondary | ICD-10-CM | POA: Insufficient documentation

## 2015-02-17 DIAGNOSIS — Z87442 Personal history of urinary calculi: Secondary | ICD-10-CM | POA: Insufficient documentation

## 2015-02-17 DIAGNOSIS — G309 Alzheimer's disease, unspecified: Secondary | ICD-10-CM | POA: Diagnosis not present

## 2015-02-17 DIAGNOSIS — Z9889 Other specified postprocedural states: Secondary | ICD-10-CM | POA: Insufficient documentation

## 2015-02-17 MED ORDER — BELLADONNA ALKALOIDS-OPIUM 16.2-60 MG RE SUPP: 1.0000 | Freq: Four times a day (QID) | RECTAL | Status: DC | PRN

## 2015-02-17 MED ORDER — VANCOMYCIN 50 MG/ML ORAL SOLUTION
250.0000 mg | Freq: Four times a day (QID) | ORAL | Status: DC
  Administered 2015-02-17 – 2015-02-18 (×3): 250 mg via ORAL
  Filled 2015-02-17 (×10): qty 5

## 2015-02-17 MED ORDER — LIDOCAINE HCL 2 % EX GEL
1.0000 "application " | Freq: Once | CUTANEOUS | Status: AC
  Administered 2015-02-17: 1 via URETHRAL

## 2015-02-17 MED ORDER — ZINC OXIDE 40 % EX OINT: 1.0000 "application " | TOPICAL_OINTMENT | CUTANEOUS | Status: DC | PRN

## 2015-02-17 MED ORDER — OXYCODONE HCL 5 MG PO TABS: 5.0000 mg | ORAL_TABLET | ORAL | Status: DC | PRN

## 2015-02-17 MED ORDER — CEFTRIAXONE SODIUM IN DEXTROSE 20 MG/ML IV SOLN
1.0000 g | Freq: Once | INTRAVENOUS | Status: DC
  Filled 2015-02-17: qty 50

## 2015-02-17 MED ORDER — MORPHINE SULFATE 2 MG/ML IJ SOLN: 2.0000 mg | INTRAMUSCULAR | Status: DC | PRN

## 2015-02-17 MED ORDER — DEXTROSE 5 % IV SOLN
1.5000 mg/kg | Freq: Three times a day (TID) | INTRAVENOUS | Status: DC
Start: 2015-02-17 — End: 2015-02-18
  Administered 2015-02-17 – 2015-02-18 (×2): 90 mg via INTRAVENOUS
  Filled 2015-02-17 (×3): qty 2.25

## 2015-02-17 MED ORDER — ASPIRIN EC 81 MG PO TBEC
81.0000 mg | DELAYED_RELEASE_TABLET | Freq: Every day | ORAL | Status: DC
  Administered 2015-02-18: 81 mg via ORAL
  Filled 2015-02-17: qty 1

## 2015-02-17 MED ORDER — GENTAMICIN SULFATE 40 MG/ML IJ SOLN
80.0000 mg | Freq: Once | INTRAMUSCULAR | Status: AC
  Administered 2015-02-17: 80 mg via INTRAMUSCULAR

## 2015-02-17 MED ORDER — DONEPEZIL HCL 5 MG PO TABS
10.0000 mg | ORAL_TABLET | Freq: Every day | ORAL | Status: DC
  Administered 2015-02-17: 10 mg via ORAL
  Filled 2015-02-17: qty 2

## 2015-02-17 MED ORDER — MIRTAZAPINE 15 MG PO TABS
30.0000 mg | ORAL_TABLET | Freq: Every day | ORAL | Status: DC
  Administered 2015-02-17: 30 mg via ORAL
  Filled 2015-02-17: qty 2

## 2015-02-17 MED ORDER — SODIUM CHLORIDE 0.9 % IV SOLN
INTRAVENOUS | Status: DC
  Administered 2015-02-18: 04:00:00 via INTRAVENOUS
  Administered 2015-02-18: 1000 mL via INTRAVENOUS
  Administered 2015-02-18: 14:00:00 via INTRAVENOUS

## 2015-02-17 MED ORDER — METOPROLOL SUCCINATE ER 25 MG PO TB24
25.0000 mg | ORAL_TABLET | Freq: Every day | ORAL | Status: DC
  Administered 2015-02-18: 25 mg via ORAL
  Filled 2015-02-17: qty 1

## 2015-02-17 MED ORDER — CETYLPYRIDINIUM CHLORIDE 0.05 % MT LIQD
7.0000 mL | Freq: Two times a day (BID) | OROMUCOSAL | Status: DC
  Administered 2015-02-18: 7 mL via OROMUCOSAL

## 2015-02-17 MED ORDER — MEMANTINE HCL 10 MG PO TABS
10.0000 mg | ORAL_TABLET | Freq: Two times a day (BID) | ORAL | Status: DC
  Administered 2015-02-17 – 2015-02-18 (×2): 10 mg via ORAL
  Filled 2015-02-17 (×4): qty 1

## 2015-02-17 MED ORDER — ACETAMINOPHEN 325 MG PO TABS: 650.0000 mg | ORAL_TABLET | ORAL | Status: DC | PRN

## 2015-02-17 MED ORDER — CITALOPRAM HYDROBROMIDE 20 MG PO TABS
20.0000 mg | ORAL_TABLET | Freq: Every day | ORAL | Status: DC
  Administered 2015-02-18: 20 mg via ORAL
  Filled 2015-02-17: qty 1

## 2015-02-17 MED ORDER — QUETIAPINE FUMARATE 100 MG PO TABS
50.0000 mg | ORAL_TABLET | Freq: Every day | ORAL | Status: DC
  Administered 2015-02-17: 50 mg via ORAL
  Filled 2015-02-17: qty 1

## 2015-02-17 NOTE — H&P (Signed)
02/17/2015 2:46 PM    Kelli Matthews March 24, 1941 [id number]   Referring provider: Jackolyn Confer, MD [address] [place], [place] [address]    Chief Complaint   Patient presents with   .  Cysto Stent Removal      HPI: 74 year old patient with advanced dementia a 1.8 cm left UPJ stone which was diagnosed in the emergency room after presenting with pain and a urinary tract infection. After several discussions with her daughter and son, her healthcare POA's, about the possible treatment options vs. observation, they ultimately elected to proceed with staged ureteroscopy to treat the stone.  She was taken to the OR on 01/04/2015 for ureteroscopy which was uncomplicated and the stone was fragmented with a good portion of the stone was removed. She was initially scheduled to return to the operating room for a staged procedure to clear out the residual stone given the significant stone burden, however, she unfortunately, developed severe C. difficile infection and was admitted to the hospital for several weeks.   Her family notes that she's been significantly weakened by her recent illness and possibly is in the end-stage of her dementia. Such, they elected NOT to proceed with any further procedures. She presents to the office today to have her stent removed for comfort.  KUB reviewed prior to procedure shows no significant encrustation or fragments along the stent.     Urine culture obtained prior to today's procedure at [facility] comments was negative for any growth.  A recent fevers or chills. He is currently on vancomycin orally for C. difficile and her diarrhea has slowed significantly.   PMH: Past Medical History   Diagnosis  Date   .  Dementia  2012   .  Maxillary sinus fracture         after fall 2013, with LOC   .  Hypertension  2001       on medicaitons for at least 10 years   .  Other esophagitis  2011   .  Personal history of tobacco use, presenting  hazards to health  2011   .  H/O cystitis  2010   .  Arthritis  2005   .  Rectal mass  2010   .  Alzheimer disease     .  Chronic kidney disease     .  Anemia     .  Alzheimer disease     .  Kidney stone     .  Hypokalemia     .  Hepatic cyst        Surgical History: Past Surgical History   Procedure  Laterality  Date   .  Breast reduction surgery    2003   .  Breast lumpectomy    1985       Benign per pt, right breast   .  Colonoscopy    2004, 2010,2011   .  Breast reduction surgery    2003or 2004       by Dr. Cathie Hoops   .  Colon surgery    2010       polyps, Dr. Bary Castilla, hemicolectomy   .  Vein ligation and stripping    2005   .  Wrist surgery    2005   .  Upper gi endoscopy    2011   .  Varicose veins    2011       laser surgery   .  Cystoscopy/ureteroscopy/holmium laser/stent placement  Left  01/04/2015  Procedure: CYSTOSCOPY/LEFTURETEROSCOPY/HOLMIUM LASER/LEFT STENT PLACEMENT/LEFT RETROGRADE;  Surgeon: Hollice Espy, MD;  Location: [facility] ORS;  Service: Urology;  Laterality: Left;      Home Medications:      Medication List           This list is accurate as of: 02/17/15  1:39 PM.  Always use your most recent med list.                         acetaminophen 325 MG tablet   Commonly known as:  TYLENOL   Take 650 mg by mouth every 4 (four) hours as needed for mild pain or fever.         aspirin EC 81 MG tablet   Take 81 mg by mouth daily.         cholecalciferol 1000 UNITS tablet   Commonly known as:  VITAMIN D   TAKE ONE TABLET BY MOUTH EACH DAY.         citalopram 20 MG tablet   Commonly known as:  CELEXA   Take 1 tablet (20 mg total) by mouth daily.         DERMACLOUD Crea   Apply 1 application topically as needed (apply to bottom).         desonide 0.05 % cream   Commonly known as:  DESOWEN   Apply 1 application topically as needed (to face).         donepezil 10 MG tablet   Commonly known as:  ARICEPT   TAKE ONE TABLET BY MOUTH EACH DAY  AT BEDTIME.         fluticasone 50 MCG/ACT nasal spray   Commonly known as:  FLONASE   Two sprays in each nostril daily.         gentamicin cream 0.1 %   Commonly known as:  GARAMYCIN   APPLY TWICE DAILY TO SKIN LESIONS ON UPPER BACK         memantine 10 MG tablet   Commonly known as:  NAMENDA   Take 10 mg by mouth 2 (two) times daily.         metoprolol succinate 25 MG 24 hr tablet   Commonly known as:  TOPROL-XL   Take 1 tablet (25 mg total) by mouth daily.         mirtazapine 15 MG tablet   Commonly known as:  REMERON   Take 30 mg by mouth at bedtime.         QUEtiapine 50 MG tablet   Commonly known as:  SEROQUEL   Take 50 mg by mouth at bedtime.         THERAVIM-M Tabs   TAKE ONE TABLET BY MOUTH EACH DAY.         vancomycin 50 mg/mL oral solution   Commonly known as:  VANCOCIN   Take 5 mLs (250 mg total) by mouth every 6 (six) hours.             Allergies:   Allergies   Allergen  Reactions   .  Augmentin [Amoxicillin-Pot Clavulanate]  Rash   .  Latex  Rash      Family History: Family History   Problem  Relation  Age of Onset   .  Alzheimer's disease  Mother     .  Stroke  Mother     .  Ovarian cancer  Sister     .  Early death  Sister  64   .  Depression  Son        Social History: reports that she quit smoking about 19 years ago. Her smoking use included Cigarettes. She quit after 17 years of use. She has never used smokeless tobacco. She reports that she does not drink alcohol or use illicit drugs.     Review of Systems: Unable obtain review of systems given patient's mental status.     Physical Exam: BP 102/65 mmHg  Pulse 83  Constitutional:  Alert but nonverbal.  No acute distress. HEENT:  AT, moist mucus membranes.  Trachea midline, no masses. Cardiovascular: No clubbing, cyanosis, or edema. RRR. Respiratory: Normal respiratory effort, no increased work of breathing.  CTAB. GI: Abdomen is soft, nontender, nondistended, no  abdominal masses GU: No CVA tenderness.  Excoriated, irritated external genitalia. Normal urethral meatus. Skin: No rashes, bruises or suspicious lesions. Neurologic: Grossly intact, no focal deficits, moving all 4 extremities. Psychiatric: Normal mood and affect.   Laboratory Data:  Recent Labs  Lab Results   Component  Value  Date     WBC  15.9*  02/04/2015     HGB  8.1*  02/04/2015     HCT  26.1*  02/04/2015     MCV  85.4  02/04/2015     PLT  706*  02/04/2015          Recent Labs  Lab Results   Component  Value  Date     CREATININE  0.94  02/04/2015        Urinalysis  Labs (Brief)     Component  Value  Date/Time     COLORURINE  YELLOW*  01/22/2015 2158     APPEARANCEUR  CLOUDY*  01/22/2015 2158     LABSPEC  1.016  01/22/2015 2158     PHURINE  5.0  01/22/2015 2158     GLUCOSEU  NEGATIVE  01/22/2015 2158     HGBUR  3+*  01/22/2015 2158     BILIRUBINUR  NEGATIVE  01/22/2015 2158     BILIRUBINUR  neg  05/18/2013 [place]  01/22/2015 2158     PROTEINUR  100*  01/22/2015 2158     PROTEINUR  neg  05/18/2013 1420     UROBILINOGEN  0.2  05/18/2013 1420     NITRITE  NEGATIVE  01/22/2015 2158     NITRITE  neg  05/18/2013 1420     LEUKOCYTESUR  1+*  01/22/2015 2158      UCx: [facility] 02/07/15 NEGATIVE for growth   Pertinent Imaging: KUB today 02/17/15          Study Result          CLINICAL DATA:  Kidney stones, left ureteral stent for 3 weeks   EXAM: ABDOMEN - 1 VIEW   COMPARISON:  CT scan 01/22/2015   FINDINGS: There is normal small bowel gas pattern. There is a left ureteral stent in place. Surgical sutures are noted in mid right abdomen. Left kidney is obscured by bowel gas. The previous left nephrolithiasis not clearly identified as the kidney is obscured.   IMPRESSION: Normal small bowel gas pattern. Left ureteral stent in place. Left kidney is obscured by bowel gas. The previous left renal stone is not clearly identified.      Electronically Signed   By: Lahoma Crocker M.D.            Cystoscopy/ Stent Removal Procedure Note   Patient  identification was confirmed, informed consent was obtained from her healthcare POA, and patient was prepped using Betadine solution.  Lidocaine jelly was administered per urethral meatus.      Preoperative abx where received prior to procedure (IM gent 80 mg).   Procedure: - Flexible cystoscope introduced, without any difficulty.    - Thorough search of the bladder revealed:   Significantly encrusted left ureteral stent loop identified. It was grasped with stent graspers and brought out through the meatus. Unfortunately, resistance was met after the majority stent removed, likely proximal coil stuck just within UVJ.  I attempted to pass a wire to the lumen of the stent to break up some of the stone particles, however, the lumen of the stent was completely occluded with stone debris. The scope was then reintroduced alongside of the stent and the stent graspers were used to attempt to dislodge some of the adherent stone.  This maneuver was unsuccessful. Ultimately, approximate half of the stent was pushed back into the remainder of the stent was pushed back up into the bladder.        Post-Procedure: - Patient tolerated the procedure well   Assessment & Plan:  74 year old female with heavily encrusted retained left ureteral stent status post unsuccessful removal today in the office. The stent is now partially dislodged and the remainder of the stent is pushed back into the bladder. I have recommended we proceed with cystoscopy, stent removal, possible ureteroscopy, possible laser lithotripsy to dislodge the stent in the operating room under anesthesia as this would be the safest to remove heavily encrusted stent. Her daughter and son were at the bedside and agree with the plan. Thus the risks of the procedure including risk of infection, bleeding, damage to ureter, possible  need for replacement of the stent as well as the risk of general anesthesia. They're agreeable with this plan.  I'll plan to admit the patient today for observation and preprocedure antibiotics which was arranged.     1. Kidney stones No further intervention for the stone desired by family given patient's overall prognosis. - lidocaine (XYLOCAINE) 2 % jelly 1 application; Place 1 application into the urethra once. - gentamicin (GARAMYCIN) injection 80 mg; Inject 2 mLs (80 mg total) into the muscle once.   2. Retained ureteral stent See above -direct admit for periprocedural abx -NPO at [facility] -consent obtained from health care proxy    Hollice Espy, MD   [facility] [address], [place], [place] [address] [phone]   I spent greater than 55 minutes where g reater than 50% of the time was spent counseling this patient and her family as well as coordinating her care.  Admission and surgery was coordinated.

## 2015-02-17 NOTE — Progress Notes (Signed)
02/17/2015 2:46 PM   Kelli Matthews September 19, 1940 [id number]  Referring provider: Jackolyn Confer, MD [address] [place], [place] [address]  Chief Complaint  Patient presents with  . Cysto Stent Removal    HPI: 74 year old patient with advanced dementia a 1.8 cm left UPJ stone which was diagnosed in the emergency room after presenting with pain and a urinary tract infection.  After several discussions with her daughter and son, her healthcare POA's, about the possible treatment options vs. observation, they ultimately elected to proceed with staged ureteroscopy to treat the stone.  She was taken to the OR on 01/04/2015 for ureteroscopy which was uncomplicated and the stone was fragmented with a good portion of the stone was removed. She was initially scheduled to return to the operating room for a staged procedure to clear out the residual stone given the significant stone burden, however, she unfortunately, developed severe C. difficile infection and was admitted to the hospital for several weeks.  Her family notes that she's been significantly weakened by her recent illness and possibly is in the end-stage of her dementia. Such, they elected NOT to proceed with any further procedures. She presents to the office today to have her stent removed for comfort.  KUB reviewed prior to procedure shows no significant encrustation or fragments along the stent.    Urine culture obtained prior to today's procedure at [facility] [employer] comments was negative for any growth.  A recent fevers or chills. He is currently on vancomycin orally for C. difficile and her diarrhea has slowed significantly.  PMH: Past Medical History  Diagnosis Date  . Dementia 2012  . Maxillary sinus fracture     after fall 2013, with LOC  . Hypertension 2001    on medicaitons for at least 10 years  . Other esophagitis 2011  . Personal history of tobacco use, presenting hazards to health 2011  . H/O cystitis 2010    . Arthritis 2005  . Rectal mass 2010  . Alzheimer disease   . Chronic kidney disease   . Anemia   . Alzheimer disease   . Kidney stone   . Hypokalemia   . Hepatic cyst     Surgical History: Past Surgical History  Procedure Laterality Date  . Breast reduction surgery  2003  . Breast lumpectomy  1985    Benign per pt, right breast  . Colonoscopy  2004, 2010,2011  . Breast reduction surgery  2003or 2004    by Dr. Cathie Hoops  . Colon surgery  2010    polyps, Dr. Bary Castilla, hemicolectomy  . Vein ligation and stripping  2005  . Wrist surgery  2005  . Upper gi endoscopy  2011  . Varicose veins  2011    laser surgery  . Cystoscopy/ureteroscopy/holmium laser/stent placement Left 01/04/2015    Procedure: CYSTOSCOPY/LEFTURETEROSCOPY/HOLMIUM LASER/LEFT STENT PLACEMENT/LEFT RETROGRADE;  Surgeon: Hollice Espy, MD;  Location: [facility] ORS;  Service: Urology;  Laterality: Left;    Home Medications:    Medication List       This list is accurate as of: 02/17/15  1:39 PM.  Always use your most recent med list.               acetaminophen 325 MG tablet  Commonly known as:  TYLENOL  Take 650 mg by mouth every 4 (four) hours as needed for mild pain or fever.     aspirin EC 81 MG tablet  Take 81 mg by mouth daily.  cholecalciferol 1000 UNITS tablet  Commonly known as:  VITAMIN D  TAKE ONE TABLET BY MOUTH EACH DAY.     citalopram 20 MG tablet  Commonly known as:  CELEXA  Take 1 tablet (20 mg total) by mouth daily.     DERMACLOUD Crea  Apply 1 application topically as needed (apply to bottom).     desonide 0.05 % cream  Commonly known as:  DESOWEN  Apply 1 application topically as needed (to face).     donepezil 10 MG tablet  Commonly known as:  ARICEPT  TAKE ONE TABLET BY MOUTH EACH DAY AT BEDTIME.     fluticasone 50 MCG/ACT nasal spray  Commonly known as:  FLONASE  Two sprays in each nostril daily.     gentamicin cream 0.1 %  Commonly known as:  GARAMYCIN  APPLY TWICE DAILY  TO SKIN LESIONS ON UPPER BACK     memantine 10 MG tablet  Commonly known as:  NAMENDA  Take 10 mg by mouth 2 (two) times daily.     metoprolol succinate 25 MG 24 hr tablet  Commonly known as:  TOPROL-XL  Take 1 tablet (25 mg total) by mouth daily.     mirtazapine 15 MG tablet  Commonly known as:  REMERON  Take 30 mg by mouth at bedtime.     QUEtiapine 50 MG tablet  Commonly known as:  SEROQUEL  Take 50 mg by mouth at bedtime.     THERAVIM-M Tabs  TAKE ONE TABLET BY MOUTH EACH DAY.     vancomycin 50 mg/mL oral solution  Commonly known as:  VANCOCIN  Take 5 mLs (250 mg total) by mouth every 6 (six) hours.        Allergies:  Allergies  Allergen Reactions  . Augmentin [Amoxicillin-Pot Clavulanate] Rash  . Latex Rash    Family History: Family History  Problem Relation Age of Onset  . Alzheimer's disease Mother   . Stroke Mother   . Ovarian cancer Sister   . Early death Sister 41  . Depression Son     Social History:  reports that she quit smoking about 19 years ago. Her smoking use included Cigarettes. She quit after 17 years of use. She has never used smokeless tobacco. She reports that she does not drink alcohol or use illicit drugs.   Review of Systems: Unable obtain review of systems given patient's mental status.   Physical Exam: BP 102/65 mmHg  Pulse 83  Constitutional:  Alert but nonverbal.  No acute distress. HEENT: [place] AT, moist mucus membranes.  Trachea midline, no masses. Cardiovascular: No clubbing, cyanosis, or edema. RRR. Respiratory: Normal respiratory effort, no increased work of breathing.  CTAB. GI: Abdomen is soft, nontender, nondistended, no abdominal masses GU: No CVA tenderness.  Excoriated, irritated external genitalia. Normal urethral meatus. Skin: No rashes, bruises or suspicious lesions. Neurologic: Grossly intact, no focal deficits, moving all 4 extremities. Psychiatric: Normal mood and affect.  Laboratory Data: Lab Results    Component Value Date   WBC 15.9* 02/04/2015   HGB 8.1* 02/04/2015   HCT 26.1* 02/04/2015   MCV 85.4 02/04/2015   PLT 706* 02/04/2015    Lab Results  Component Value Date   CREATININE 0.94 02/04/2015    Urinalysis    Component Value Date/Time   COLORURINE YELLOW* 01/22/2015 2158   APPEARANCEUR CLOUDY* 01/22/2015 2158   LABSPEC 1.016 01/22/2015 2158   PHURINE 5.0 01/22/2015 2158   GLUCOSEU NEGATIVE 01/22/2015 2158   HGBUR 3+* 01/22/2015  2158   BILIRUBINUR NEGATIVE 01/22/2015 2158   BILIRUBINUR neg 05/18/2013 [place] 01/22/2015 2158   PROTEINUR 100* 01/22/2015 2158   PROTEINUR neg 05/18/2013 1420   UROBILINOGEN 0.2 05/18/2013 1420   NITRITE NEGATIVE 01/22/2015 2158   NITRITE neg 05/18/2013 1420   LEUKOCYTESUR 1+* 01/22/2015 2158   UCx: [facility] 02/07/15 NEGATIVE for growth  Pertinent Imaging: KUB today 02/17/15     Study Result     CLINICAL DATA: Kidney stones, left ureteral stent for 3 weeks  EXAM: ABDOMEN - 1 VIEW  COMPARISON: CT scan 01/22/2015  FINDINGS: There is normal small bowel gas pattern. There is a left ureteral stent in place. Surgical sutures are noted in mid right abdomen. Left kidney is obscured by bowel gas. The previous left nephrolithiasis not clearly identified as the kidney is obscured.  IMPRESSION: Normal small bowel gas pattern. Left ureteral stent in place. Left kidney is obscured by bowel gas. The previous left renal stone is not clearly identified.   Electronically Signed  By: Lahoma Crocker M.D.      Cystoscopy/ Stent Removal Procedure Note  Patient identification was confirmed, informed consent was obtained from her healthcare POA, and patient was prepped using Betadine solution.  Lidocaine jelly was administered per urethral meatus.     Preoperative abx where received prior to procedure (IM gent 80 mg).  Procedure: - Flexible cystoscope introduced, without any difficulty.   - Thorough  search of the bladder revealed:  Significantly encrusted left ureteral stent loop identified. It was grasped with stent graspers and brought out through the meatus. Unfortunately, resistance was met after the majority stent removed, likely proximal coil stuck just within UVJ.  I attempted to pass a wire to the lumen of the stent to break up some of the stone particles, however, the lumen of the stent was completely occluded with stone debris. The scope was then reintroduced alongside of the stent and the stent graspers were used to attempt to dislodge some of the adherent stone.  This maneuver was unsuccessful. Ultimately, approximate half of the stent was pushed back into the remainder of the stent was pushed back up into the bladder.      Post-Procedure: - Patient tolerated the procedure well  Assessment & Plan:  74 year old female with heavily encrusted retained left ureteral stent status post unsuccessful removal today in the office. The stent is now partially dislodged and the remainder of the stent is pushed back into the bladder. I have recommended we proceed with cystoscopy, stent removal, possible ureteroscopy, possible laser lithotripsy to dislodge the stent in the operating room under anesthesia as this would be the safest to remove heavily encrusted stent. Her daughter and son were at the bedside and agree with the plan. Thus the risks of the procedure including risk of infection, bleeding, damage to ureter, possible need for replacement of the stent as well as the risk of general anesthesia. They're agreeable with this plan.  I'll plan to admit the patient today for observation and preprocedure antibiotics which was arranged.    1. Kidney stones No further intervention for the stone desired by family given patient's overall prognosis. - lidocaine (XYLOCAINE) 2 % jelly 1 application; Place 1 application into the urethra once. - gentamicin (GARAMYCIN) injection 80 mg; Inject 2 mLs (80 mg  total) into the muscle once.  2. Retained ureteral stent See above -direct admit for periprocedural abx -NPO at [facility] -consent obtained from health care proxy   Hollice Espy,  MD  [place] [address] [place], [place] [address] [phone]  I spent greater than 55 minutes where g reater than 50% of the time was spent counseling this patient and her family as well as coordinating her care.  Admission and surgery was coordinated.

## 2015-02-17 NOTE — Progress Notes (Signed)
IM Injection  Patient is present today for an IM Injection after Cysto stent removal Drug: Gentamicin Dose:80mg  Location:Left upper outer buttocks Lot: [id number] Exp:2/17 Patient tolerated well, no complications were noted  Preformed by: Eligha Bridegroom, CMA

## 2015-02-17 NOTE — Progress Notes (Signed)
MEDICATION RELATED CONSULT NOTE - INITIAL   Pharmacy Consult for Renal adjustment of antibiotics   Allergies  Allergen Reactions  . Augmentin [Amoxicillin-Pot Clavulanate] Rash  . Latex Rash   Estimated Creatinine Clearance: 48 mL/min (by C-G formula based on Cr of 0.94).   Anti-infectives    Start     Dose/Rate Route Frequency Ordered Stop   02/18/15 1400  cefTRIAXone (ROCEPHIN) 1 g in dextrose 5 % 50 mL IVPB - Premix    Comments:  On call to OR   1 g 100 mL/hr over 30 Minutes Intravenous  Once 02/17/15 1421     02/17/15 2000  gentamicin (GARAMYCIN) 90 mg in dextrose 5 % 50 mL IVPB     1.5 mg/kg  59 kg 104.5 mL/hr over 30 Minutes Intravenous Every 8 hours 02/17/15 1420     02/17/15 1415  vancomycin (VANCOCIN) 50 mg/mL oral solution 250 mg     250 mg Oral 4 times per day 02/17/15 1412       Pharmacy consulted to renal dose adjust antibiotic in this 74 year old female admitted for urological procedure.  Patient with orders for gentamicin 90mg  IV Q8H to start tonight, until procedure tomorrow.  Discussed with MD, no need to monitor or adjust at this point. If gentamicin continued after procedure will consider obtaining peak and trough.  Pharmacy to follow per consult.   Garlon Hatchet, PharmD Clinical Pharmacist  02/17/2015,3:01 PM

## 2015-02-18 ENCOUNTER — Encounter: Payer: Self-pay | Admitting: Anesthesiology

## 2015-02-18 ENCOUNTER — Encounter: Payer: Self-pay | Admitting: Registered Nurse

## 2015-02-18 ENCOUNTER — Encounter
Admission: AD | Disposition: A | Discharge status: Skilled Nursing Facility | Payer: Self-pay | Source: Ambulatory Visit | Attending: Urology

## 2015-02-18 ENCOUNTER — Encounter: Payer: Medicare Other | Admitting: Registered Nurse

## 2015-02-18 ENCOUNTER — Ambulatory Visit: Admit: 2015-02-18 | Payer: Self-pay | Admitting: Urology

## 2015-02-18 DIAGNOSIS — Z960 Presence of urogenital implants: Secondary | ICD-10-CM | POA: Diagnosis not present

## 2015-02-18 DIAGNOSIS — Z1889 Other specified retained foreign body fragments: Secondary | ICD-10-CM | POA: Diagnosis not present

## 2015-02-18 HISTORY — PX: CYSTOSCOPY W/ URETERAL STENT REMOVAL: SHX1430

## 2015-02-18 LAB — BASIC METABOLIC PANEL
Anion gap: 9 (ref 5–15)
BUN: 35 mg/dL — AB (ref 6–20)
CHLORIDE: 104 mmol/L (ref 101–111)
CO2: 24 mmol/L (ref 22–32)
Calcium: 8.5 mg/dL — ABNORMAL LOW (ref 8.9–10.3)
Creatinine, Ser: 1.25 mg/dL — ABNORMAL HIGH (ref 0.44–1.00)
GFR calc Af Amer: 48 mL/min — ABNORMAL LOW (ref >60)
GFR calc non Af Amer: 42 mL/min — ABNORMAL LOW (ref >60)
Glucose, Bld: 91 mg/dL (ref 65–99)
Potassium: 3.9 mmol/L (ref 3.5–5.1)
Sodium: 137 mmol/L (ref 135–145)

## 2015-02-18 SURGERY — REMOVAL, STENT, URETER, CYSTOSCOPIC
Anesthesia: General | Laterality: Left | Wound class: Clean Contaminated

## 2015-02-18 MED ORDER — ONDANSETRON HCL 4 MG/2ML IJ SOLN
INTRAMUSCULAR | Status: DC | PRN
  Administered 2015-02-18: 4 mg via INTRAVENOUS

## 2015-02-18 MED ORDER — FENTANYL CITRATE (PF) 100 MCG/2ML IJ SOLN
INTRAMUSCULAR | Status: DC | PRN
  Administered 2015-02-18 (×2): 50 ug via INTRAVENOUS

## 2015-02-18 MED ORDER — CEFTRIAXONE SODIUM 1 G IJ SOLR
INTRAMUSCULAR | Status: AC
  Filled 2015-02-18: qty 10

## 2015-02-18 MED ORDER — GENTAMICIN SULFATE 40 MG/ML IJ SOLN: 1.5000 mg/kg | INTRAVENOUS | Status: DC

## 2015-02-18 MED ORDER — GENTAMICIN SULFATE 40 MG/ML IJ SOLN: 1.5000 mg/kg | Freq: Two times a day (BID) | INTRAVENOUS | Status: DC

## 2015-02-18 MED ORDER — SUCCINYLCHOLINE CHLORIDE 20 MG/ML IJ SOLN
INTRAMUSCULAR | Status: DC | PRN
  Administered 2015-02-18: 80 mg via INTRAVENOUS

## 2015-02-18 MED ORDER — PHENYLEPHRINE HCL 10 MG/ML IJ SOLN
INTRAMUSCULAR | Status: DC | PRN
  Administered 2015-02-18 (×6): 100 ug via INTRAVENOUS

## 2015-02-18 MED ORDER — PROPOFOL 10 MG/ML IV BOLUS
INTRAVENOUS | Status: DC | PRN
  Administered 2015-02-18: 60 mg via INTRAVENOUS

## 2015-02-18 MED ORDER — ONDANSETRON HCL 4 MG/2ML IJ SOLN: 4.0000 mg | Freq: Once | INTRAMUSCULAR | Status: DC | PRN

## 2015-02-18 MED ORDER — EPHEDRINE SULFATE 50 MG/ML IJ SOLN
INTRAMUSCULAR | Status: DC | PRN
  Administered 2015-02-18: 10 mg via INTRAVENOUS

## 2015-02-18 MED ORDER — LIDOCAINE HCL (CARDIAC) 20 MG/ML IV SOLN
INTRAVENOUS | Status: DC | PRN
  Administered 2015-02-18: 100 mg via INTRAVENOUS

## 2015-02-18 MED ORDER — FENTANYL CITRATE (PF) 100 MCG/2ML IJ SOLN: 25.0000 ug | INTRAMUSCULAR | Status: DC | PRN

## 2015-02-18 SURGICAL SUPPLY — 16 items
BAG DRAIN CYSTO-URO LG1000N (MISCELLANEOUS) ×3 IMPLANT
GLOVE BIO SURGEON STRL SZ 6.5 (GLOVE) ×2 IMPLANT
GLOVE BIO SURGEON STRL SZ7 (GLOVE) ×6 IMPLANT
GLOVE BIO SURGEONS STRL SZ 6.5 (GLOVE) ×1
GOWN STRL REUS W/ TWL LRG LVL3 (GOWN DISPOSABLE) ×2 IMPLANT
GOWN STRL REUS W/TWL LRG LVL3 (GOWN DISPOSABLE) ×4
JELLY LUB 2OZ STRL (MISCELLANEOUS) ×2
JELLY LUBE 2OZ STRL (MISCELLANEOUS) ×1 IMPLANT
PACK CYSTO AR (MISCELLANEOUS) ×3 IMPLANT
PREP PVP WINGED SPONGE (MISCELLANEOUS) ×3 IMPLANT
SENSORWIRE 0.038 NOT ANGLED (WIRE) ×3
SET CYSTO W/LG BORE CLAMP LF (SET/KITS/TRAYS/PACK) ×3 IMPLANT
SOL .9 NS 3000ML IRR  AL (IV SOLUTION) ×2
SOL .9 NS 3000ML IRR UROMATIC (IV SOLUTION) ×1 IMPLANT
WATER STERILE IRR 1000ML POUR (IV SOLUTION) ×3 IMPLANT
WIRE SENSOR 0.038 NOT ANGLED (WIRE) ×1 IMPLANT

## 2015-02-18 NOTE — Progress Notes (Signed)
MEDICATION RELATED CONSULT NOTE - Follow Up  Pharmacy Consult for Renal adjustment of antibiotics   Allergies  Allergen Reactions  . Augmentin [Amoxicillin-Pot Clavulanate] Rash  . Latex Rash   Estimated Creatinine Clearance: 33.2 mL/min (by C-G formula based on Cr of 1.25).   Anti-infectives    Start     Dose/Rate Route Frequency Ordered Stop   02/18/15 1544  gentamicin (GARAMYCIN) 90 mg in dextrose 5 % 50 mL IVPB    Comments:  Dose adjusted for CrCl < 70ml/min   1.5 mg/kg  59 kg 104.5 mL/hr over 30 Minutes Intravenous Every 12 hours 02/18/15 0819     02/18/15 1400  cefTRIAXone (ROCEPHIN) 1 g in dextrose 5 % 50 mL IVPB - Premix    Comments:  On call to OR   1 g 100 mL/hr over 30 Minutes Intravenous  Once 02/17/15 1421     02/17/15 2000  gentamicin (GARAMYCIN) 90 mg in dextrose 5 % 50 mL IVPB  Status:  Discontinued     1.5 mg/kg  59 kg 104.5 mL/hr over 30 Minutes Intravenous Every 8 hours 02/17/15 1420 02/18/15 0819   02/17/15 1415  vancomycin (VANCOCIN) 50 mg/mL oral solution 250 mg     250 mg Oral 4 times per day 02/17/15 1412       Pharmacy consulted to renal dose adjust antibiotic in this 74 year old female admitted for urological procedure.  Patient with orders for gentamicin 90mg  IV Q8H starting the evening of 6/23 to run continue until urological procedure.   Will adjust for current renal function to gentamicin 90mg  Q24H (next dose will be ~0400 6/25)  Per discussion with MD, orders should be d/c'd after procedure today. If orders are continued, will need to check peak and trough with next dose.  Pharmacy to follow per consult.   Garlon Hatchet, PharmD Clinical Pharmacist  02/18/2015,3:06 PM

## 2015-02-18 NOTE — Discharge Instructions (Signed)
Resume all normal activity.

## 2015-02-18 NOTE — Brief Op Note (Signed)
02/17/2015 - 02/18/2015  3:20 PM  PATIENT:  Kelli Matthews  74 y.o. female  PRE-OPERATIVE DIAGNOSIS:  Retained ureteral stent  POST-OPERATIVE DIAGNOSIS:  no stent   PROCEDURE:  Procedure(s): CYSTOSCOPY WITH STENT REMOVAL (Left)  SURGEON:  Surgeon(s) and Role:    * Vanna Scotland, MD - Primary  ASSISTANTS: none   ANESTHESIA:   general  EBL:  Total I/O In: 1085.4 [I.V.:1085.4] Out: -   Drains: none  Specimen: none  COUNTS CORRECT: YES  PLAN OF CARE: Return to floor- discharge home thereafter  PATIENT DISPOSITION:  PACU - hemodynamically stable.

## 2015-02-18 NOTE — Clinical Social Work Note (Signed)
Patient to return to [facility] via EMS today per daughter request. York Spaniel MSW,LCSWA [phone]

## 2015-02-18 NOTE — Progress Notes (Signed)
[facility] contacted and spoke to [employer] RN and given report on patient condition. IV discontinued site without s/s of infiltration. EMS called  To transport patient to [facility]

## 2015-02-18 NOTE — Progress Notes (Signed)
Called [facility] to give report to the nurse prior to transfer and was unable to contact a nurse on the phone. Will continue to call until a nurse answers the phone.

## 2015-02-18 NOTE — Discharge Summary (Signed)
Date of admission: 02/17/2015  Date of discharge: 02/18/2015  Admission diagnosis: Retained ureteral stent  Discharge diagnosis: Retained ureteral stent  Secondary diagnoses:  Patient Active Problem List   Diagnosis Date Noted  . Retained ureteral stent 02/17/2015  . C. difficile diarrhea 01/22/2015  . Hypokalemia 01/22/2015  . Elevated LFTs 01/22/2015  . Ankle fracture 01/21/2015  . HLD (hyperlipidemia) 01/21/2015  . Anemia 12/31/2013  . Chronic diarrhea 11/05/2013  . Acute pancreatitis 09/08/2013  . Personal history of colonic polyps 06/04/2013  . Sleep disturbance 01/26/2013  . Hypertension 09/27/2011  . Dementia with behavioral disturbance 09/27/2011    History and Physical: For full details, please see admission history and physical. Briefly, Kelli Matthews is a 74 y.o. year old patient with h/o kidney stones who underwent attempted stent removal yesterday in the office.  Her stent was unable to be removed due to encrustation/ kinking therefore she was admitted overnight for observation and abx given GU manipulation.   Hospital Course: Patient tolerated the procedure well.  She was then transferred to the floor after an uneventful PACU stay.  Her hospital course was uncomplicated.  On POD#1  she had met discharge criteria: was eating a regular diet, was up and ambulating independently,  pain was well controlled, was voiding without a catheter, and was ready to for discharge.   Laboratory values:  No results for input(s): WBC, HGB, HCT in the last 72 hours.  Recent Labs  02/18/15 1354  NA 137  K 3.9  CL 104  CO2 24  GLUCOSE 91  BUN 35*  CREATININE 1.25*  CALCIUM 8.5*   No results for input(s): LABPT, INR in the last 72 hours. No results for input(s): LABURIN in the last 72 hours. Results for orders placed or performed during the hospital encounter of 01/22/15  C difficile quick scan w PCR reflex [facility])     Status: None   Collection Time: 01/22/15  9:58 PM  Result  Value Ref Range Status   C Diff antigen POSITIVE  Final   C Diff toxin NEGATIVE  Final   C Diff interpretation REFLEX TO PCR  Final  Urine culture     Status: None   Collection Time: 01/22/15  9:58 PM  Result Value Ref Range Status   Specimen Description URINE, RANDOM  Final   Special Requests Normal  Final   Culture >=100,000 COLONIES/mL ENTEROCOCCUS RAFFINOSUS  Final   Report Status 01/31/2015 FINAL  Final   Organism ID, Bacteria ENTEROCOCCUS RAFFINOSUS  Final      Susceptibility   Enterococcus raffinosus - MIC (ETEST)*    AMPICILLIN SENSITIVE Sensitive     VANCOMYCIN SENSITIVE Sensitive     LEVOFLOXACIN RESISTANT Resistant     LINEZOLID INTERMEDIATE Intermediate     * >=100,000 COLONIES/mL ENTEROCOCCUS RAFFINOSUS  Clostridium Difficile by PCR (not at [facility])     Status: Abnormal   Collection Time: 01/22/15  9:58 PM  Result Value Ref Range Status   C difficile by pcr POSITIVE (A) NEGATIVE Final    Comment: CRITICAL RESULT CALLED TO, READ BACK BY AND VERIFIED WITH: LUIS FLORES AT 2336 01/22/15.PMH   Culture, blood (routine x 2)     Status: None   Collection Time: 01/22/15 10:53 PM  Result Value Ref Range Status   Specimen Description BLOOD  Final   Special Requests Normal  Final   Culture  Setup Time   Final    GRAM POSITIVE DIPLOCOCCI AEROBIC BOTTLE ONLY CRITICAL RESULT CALLED TO, READ  BACK BY AND VERIFIED WITH: Raynelle Fanning AT 2114 01/23/15.PMH    Culture   Final    ENTEROCOCCUS RAFFINOSUS AEROBIC BOTTLE ONLY REFER TO OTHER SET FOR SENSITIVITIES     Report Status 01/27/2015 FINAL  Final  Culture, blood (routine x 2)     Status: None   Collection Time: 01/22/15 10:53 PM  Result Value Ref Range Status   Specimen Description BLOOD  Final   Special Requests Normal  Final   Culture  Setup Time   Final    GRAM POSITIVE DIPLOCOCCI IN BOTH AEROBIC AND ANAEROBIC BOTTLES CRITICAL RESULT CALLED TO, READ BACK BY AND VERIFIED WITH: CALLED TO SHAE BREWER ON 01/22/18 AT 1820 BY  JEF GRAM POSITIVE DIPLOCOCCI CRITICAL RESULT CALLED TO, READ BACK BY AND VERIFIED WITH: Spero Curb [facility] AT 2114 01/23/15.PMH    Culture   Final    ENTEROCOCCUS RAFFINOSUS IN BOTH AEROBIC AND ANAEROBIC BOTTLES    Report Status 01/27/2015 FINAL  Final   Organism ID, Bacteria ENTEROCOCCUS RAFFINOSUS  Final      Susceptibility   Enterococcus raffinosus - MIC (ETEST)*    AMPICILLIN Value in next row Sensitive      SENSITIVE8.0    VANCOMYCIN Value in next row Sensitive      SENSITIVE1.5    LEVOFLOXACIN Value in next row Resistant      RESISTANT>32    LINEZOLID Value in next row Sensitive      SENSITIVE2.0    * ENTEROCOCCUS RAFFINOSUS  Stool culture     Status: None   Collection Time: 01/24/15  2:52 AM  Result Value Ref Range Status   Specimen Description STOOL  Final   Special Requests NONE  Final   Culture   Final    NO SALMONELLA OR SHIGELLA ISOLATED No Pathogenic E. coli detected NO CAMPYLOBACTER DETECTED    Report Status 01/27/2015 FINAL  Final  Culture, blood (routine x 2)     Status: None   Collection Time: 01/25/15  3:20 PM  Result Value Ref Range Status   Specimen Description BLOOD  Final   Special Requests BLOOD  Final   Culture NO GROWTH 5 DAYS  Final   Report Status 01/30/2015 FINAL  Final  Culture, blood (routine x 2)     Status: None   Collection Time: 01/25/15  3:25 PM  Result Value Ref Range Status   Specimen Description BLOOD  Final   Special Requests BLOOD  Final   Culture NO GROWTH 5 DAYS  Final   Report Status 01/30/2015 FINAL  Final    Disposition: Home  Discharge instruction: Return to normal activity.  Discharge medications:   Medication List    TAKE these medications        acetaminophen 325 MG tablet  Commonly known as:  TYLENOL  Take 650 mg by mouth every 4 (four) hours as needed for mild pain or fever.     aspirin EC 81 MG tablet  Take 81 mg by mouth daily.     cholecalciferol 1000 UNITS tablet  Commonly known as:  VITAMIN D  TAKE  ONE TABLET BY MOUTH EACH DAY.     citalopram 20 MG tablet  Commonly known as:  CELEXA  Take 1 tablet (20 mg total) by mouth daily.     desonide 0.05 % cream  Commonly known as:  DESOWEN  Apply 1 application topically as needed (to face).     donepezil 10 MG tablet  Commonly known as:  ARICEPT  TAKE ONE  TABLET BY MOUTH EACH DAY AT BEDTIME.     fluticasone 50 MCG/ACT nasal spray  Commonly known as:  FLONASE  Two sprays in each nostril daily.     gentamicin cream 0.1 %  Commonly known as:  GARAMYCIN  APPLY TWICE DAILY TO SKIN LESIONS ON UPPER BACK     memantine 10 MG tablet  Commonly known as:  NAMENDA  Take 10 mg by mouth 2 (two) times daily.     metoprolol succinate 25 MG 24 hr tablet  Commonly known as:  TOPROL-XL  Take 1 tablet (25 mg total) by mouth daily.     mirtazapine 15 MG tablet  Commonly known as:  REMERON  Take 30 mg by mouth at bedtime.     QUEtiapine 50 MG tablet  Commonly known as:  SEROQUEL  Take 50 mg by mouth at bedtime.     THERAVIM-M Tabs  TAKE ONE TABLET BY MOUTH EACH DAY.     vancomycin 50 mg/mL oral solution  Commonly known as:  VANCOCIN  Take 5 mLs (250 mg total) by mouth every 6 (six) hours.        Followup:      Follow-up Information    Follow up with Hollice Espy, MD.   Specialty:  Urology   Why:  As needed   Contact information:   [address] [place] [address] [phone]

## 2015-02-18 NOTE — Anesthesia Procedure Notes (Signed)
Procedure Name: Intubation Date/Time: 02/18/2015 2:46 PM Performed by: Stormy Fabian Pre-anesthesia Checklist: Patient identified, Patient being monitored, Timeout performed, Emergency Drugs available and Suction available Patient Re-evaluated:Patient Re-evaluated prior to inductionOxygen Delivery Method: Circle system utilized Preoxygenation: Pre-oxygenation with 100% oxygen Intubation Type: IV induction Ventilation: Mask ventilation without difficulty Laryngoscope Size: Mac and 3 Grade View: Grade I Tube type: Oral Tube size: 7.0 mm Number of attempts: 1 Airway Equipment and Method: Stylet Placement Confirmation: ETT inserted through vocal cords under direct vision,  positive ETCO2 and breath sounds checked- equal and bilateral Secured at: 21 cm Tube secured with: Tape Dental Injury: Teeth and Oropharynx as per pre-operative assessment

## 2015-02-18 NOTE — Transfer of Care (Signed)
  Immediate Anesthesia Transfer of Care Note  Patient: Kelli Matthews  Procedure(s) Performed: Procedure(s): CYSTOSCOPY WITH STENT REMOVAL (Left)  Patient Location: PACU  Anesthesia Type:General  Level of Consciousness: sedated  Airway & Oxygen Therapy: Patient Spontanous Breathing and Patient connected to face mask oxygen  Post-op Assessment: Report given to RN and Post -op Vital signs reviewed and stable  Post vital signs: Reviewed and stable  Last Vitals:  Filed Vitals:   02/18/15 1522  BP: 99/47  Pulse: 81  Temp: 37.1 C  Resp: 12    Complications: No apparent anesthesia complications

## 2015-02-18 NOTE — Anesthesia Preprocedure Evaluation (Addendum)
Anesthesia Evaluation  Patient identified by MRN, date of birth, ID band Patient awake    Reviewed: Allergy & Precautions, NPO status , Patient's Chart, lab work & pertinent test results  Airway Mallampati: II  TM Distance: >3 FB Neck ROM: Full    Dental no notable dental hx.    Pulmonary neg pulmonary ROS, former smoker,  breath sounds clear to auscultation  Pulmonary exam normal       Cardiovascular hypertension, Normal cardiovascular examRhythm:Regular Rate:Normal     Neuro/Psych dementia negative psych ROS   GI/Hepatic negative GI ROS, Neg liver ROS,   Endo/Other  negative endocrine ROS  Renal/GU CRFRenal disease  negative genitourinary   Musculoskeletal  (+) Arthritis -, Osteoarthritis,    Abdominal (+) + obese,   Peds negative pediatric ROS (+)  Hematology  (+) anemia ,   Anesthesia Other Findings   Reproductive/Obstetrics negative OB ROS                           Anesthesia Physical Anesthesia Plan  ASA: III  Anesthesia Plan: General   Post-op Pain Management:    Induction: Rapid sequence and Intravenous  Airway Management Planned: Oral ETT  Additional Equipment:   Intra-op Plan:   Post-operative Plan: Extubation in OR  Informed Consent: I have reviewed the patients History and Physical, chart, labs and discussed the procedure including the risks, benefits and alternatives for the proposed anesthesia with the patient or authorized representative who has indicated his/her understanding and acceptance.   Dental advisory given  Plan Discussed with: CRNA and Surgeon  Anesthesia Plan Comments:         Anesthesia Quick Evaluation

## 2015-02-18 NOTE — Op Note (Signed)
Date of procedure: 02/18/2015  Preoperative diagnosis:  1. Retained left ureteral stent  Postoperative diagnosis:  1. No stent identified  Procedure: 1. Cystoscopy  Surgeon: Vanna Scotland, MD  Anesthesia: General  Complications: None  Intraoperative findings: Stent not seen on cystoscopy. Stent also not visualized on fluoroscopy.  Presumably stent spontaenously dislodged overnight.  EBL: none  Specimens: none   Drains: none  Indication: Kelli Matthews is a 74 y.o. patient with encrusted left ureteral stent who underwent cystoscopy, stent removal yesterday in the office but unable to remove the entire stent.  She was admitted overnight for observation. KUB yesterday evening showed the retained stent with a kink in the proximal portion of the stent at the level of the UVJ.  After reviewing the management options for treatment, he elected to proceed with the above surgical procedure(s). We have discussed the potential benefits and risks of the procedure, side effects of the proposed treatment, the likelihood of the patient achieving the goals of the procedure, and any potential problems that might occur during the procedure or recuperation. Informed consent has been obtained from her healthcare POA.  Description of procedure:  The patient was taken to the operating room and general anesthesia was induced.  The patient was placed in the dorsal lithotomy position, prepped and draped in the usual sterile fashion, and preoperative antibiotics had already been administered. A preoperative time-out was performed.   At this point in time, a rigid 22 French cystoscope was advanced per urethra into the bladder. The bladder was inspected and there is no stent was identified. The left UO appeared normal with no stent emanating from this area or within the bladder. There was some stone debris within the bladder. Several fluoroscopic images were obtained of the bladder and ureter which revealed no  residual stent. The stent had presumably been dislodged spontaneously overnight. At this point time, the procedure was deemed complete. The patient was repositioned in the supine position, reversed from anesthesia, taken to the PACU in stable condition.   Findings were discussed with her family.    Vanna Scotland, M.D.

## 2015-02-18 NOTE — Anesthesia Postprocedure Evaluation (Signed)
  Anesthesia Post-op Note  Patient: Kelli Matthews  Procedure(s) Performed: Procedure(s): CYSTOSCOPY WITH STENT REMOVAL (Left)  Anesthesia type:General  Patient location: PACU  Post pain: Pain level controlled  Post assessment: Post-op Vital signs reviewed, Patient's Cardiovascular Status Stable, Respiratory Function Stable, Patent Airway and No signs of Nausea or vomiting  Post vital signs: Reviewed and stable  Last Vitals:  Filed Vitals:   02/18/15 1607  BP: 124/62  Pulse: 77  Temp: 36.4 C  Resp: 12    Level of consciousness: awake, alert  and patient cooperative  Complications: No apparent anesthesia complications

## 2015-02-18 NOTE — Clinical Social Work Note (Signed)
CSW informed by nurse this afternoon that Dr. Apolinar Junes called up to the floor to state patient was from [facility] and could return to [facility] today. CSW was able to finally reach [place] at [facility] at 4:15pm to confirm patient could return today and patient was given clearance to return today. Gala Romney is aware that patient will not have an FL2 as patient was observation and he agreed that this would be fine. CSW spoke with patient's daughter and she is in agreement with the discharge and return to [facility]. Discharge summary sent to [facility] at [facility].  York Spaniel MSW,LCSWA

## 2015-02-21 ENCOUNTER — Encounter: Payer: Self-pay | Admitting: Urology

## 2015-03-02 ENCOUNTER — Other Ambulatory Visit: Payer: Self-pay | Admitting: Urology

## 2015-03-02 ENCOUNTER — Telehealth: Payer: Self-pay | Admitting: Internal Medicine

## 2015-03-02 NOTE — Telephone Encounter (Signed)
Patient's son has scheduled a appointment with carrie on Friday at 230pm.

## 2015-03-02 NOTE — Telephone Encounter (Signed)
Rash on left side of her buttocks and in between. Have been using derma cloud ,but it has not been healing. Needing an order for depends , derma cloud and rash.

## 2015-03-02 NOTE — Telephone Encounter (Signed)
Spoke to Dana[employer] Corporationmargaret.  She states rash looks more one sided and has "bumps".  It is probably yeast, but need to make sure what we are treating.  She is going to call the son and see if he could bring her in tomorrow.  Can see one of us for work in for rash.  Please call Kelli Matthews back and see if she was able to get in touch with son.  Thanks

## 2015-03-02 NOTE — Telephone Encounter (Signed)
Dr. Lorin PicketScott can you assist in Dr. Tilman NeatWalker's absence? Thanks

## 2015-03-04 ENCOUNTER — Ambulatory Visit (INDEPENDENT_AMBULATORY_CARE_PROVIDER_SITE_OTHER): Payer: Medicare Other | Admitting: Nurse Practitioner

## 2015-03-04 VITALS — BP 102/58 | HR 73 | Temp 99.1°F | Resp 14 | Ht 63.0 in | Wt 122.0 lb

## 2015-03-04 DIAGNOSIS — F0391 Unspecified dementia with behavioral disturbance: Secondary | ICD-10-CM | POA: Diagnosis not present

## 2015-03-04 DIAGNOSIS — F03918 Unspecified dementia, unspecified severity, with other behavioral disturbance: Secondary | ICD-10-CM

## 2015-03-04 DIAGNOSIS — R21 Rash and other nonspecific skin eruption: Secondary | ICD-10-CM | POA: Diagnosis not present

## 2015-03-04 DIAGNOSIS — K851 Biliary acute pancreatitis without necrosis or infection: Secondary | ICD-10-CM

## 2015-03-04 LAB — CBC WITH DIFFERENTIAL/PLATELET
BASOS ABS: 0.1 10*3/uL (ref 0.0–0.1)
BASOS PCT: 1 % (ref 0–1)
EOS PCT: 3 % (ref 0–5)
Eosinophils Absolute: 0.3 10*3/uL (ref 0.0–0.7)
HCT: 27 % — ABNORMAL LOW (ref 36.0–46.0)
Hemoglobin: 8.2 g/dL — ABNORMAL LOW (ref 12.0–15.0)
Lymphocytes Relative: 22 % (ref 12–46)
Lymphs Abs: 2 10*3/uL (ref 0.7–4.0)
MCH: 26.4 pg (ref 26.0–34.0)
MCHC: 30.4 g/dL (ref 30.0–36.0)
MCV: 86.8 fL (ref 78.0–100.0)
MONO ABS: 0.9 10*3/uL (ref 0.1–1.0)
MPV: 9.6 fL (ref 8.6–12.4)
Monocytes Relative: 10 % (ref 3–12)
Neutro Abs: 5.7 10*3/uL (ref 1.7–7.7)
Neutrophils Relative %: 64 % (ref 43–77)
PLATELETS: 631 10*3/uL — AB (ref 150–400)
RBC: 3.11 MIL/uL — ABNORMAL LOW (ref 3.87–5.11)
RDW: 15.4 % (ref 11.5–15.5)
WBC: 8.9 10*3/uL (ref 4.0–10.5)

## 2015-03-04 LAB — BASIC METABOLIC PANEL
BUN: 18 mg/dL (ref 6–23)
CO2: 25 mEq/L (ref 19–32)
Calcium: 8.1 mg/dL — ABNORMAL LOW (ref 8.4–10.5)
Chloride: 103 mEq/L (ref 96–112)
Creat: 1.08 mg/dL (ref 0.50–1.10)
Glucose, Bld: 75 mg/dL (ref 70–99)
Potassium: 3.4 mEq/L — ABNORMAL LOW (ref 3.5–5.3)
Sodium: 140 mEq/L (ref 135–145)

## 2015-03-04 LAB — HEPATIC FUNCTION PANEL
ALK PHOS: 92 U/L (ref 39–117)
ALT: 24 U/L (ref 0–35)
AST: 39 U/L — ABNORMAL HIGH (ref 0–37)
Albumin: 3.1 g/dL — ABNORMAL LOW (ref 3.5–5.2)
Bilirubin, Direct: <0.1 mg/dL (ref 0.0–0.3)
TOTAL PROTEIN: 6 g/dL (ref 6.0–8.3)
Total Bilirubin: 0.2 mg/dL (ref 0.2–1.2)

## 2015-03-04 LAB — AMYLASE: Amylase: 62 U/L (ref 0–105)

## 2015-03-04 LAB — LIPASE: LIPASE: 48 U/L (ref 0–75)

## 2015-03-04 NOTE — Patient Instructions (Addendum)
Please continue to follow up with Dr. Dan HumphreysWalker in August.  Please visit the lab today.

## 2015-03-04 NOTE — Progress Notes (Signed)
Pre visit review using our clinic review tool, if applicable. No additional management support is needed unless otherwise documented below in the visit note. 

## 2015-03-04 NOTE — Progress Notes (Signed)
   Subjective:    Patient ID: Kelli Matthews, female    DOB: 06/27/1941, 74 y.o.   MRN: [id number]  HPI  Ms. Kelli Matthews is a 74 yo female with a CC of rash and bump on left foot. Son has accompanied her since she is non-verbal.   1) [facility] for [facility] week, oral surgeon pulled tooth, 3 weeks of c. Diff. She has been in and out of facilities. She is non-communicative.  2) Left cheek- Bumps on buttock per "Kelli Matthews". Supposedly on left side and in between buttocks per note from call.   3) New onset- nurse wanted son to ask about bump on top of left foot. Pt's son is interested in having podiatry cut her toenails.   Review of Systems  Constitutional: Positive for fatigue. Negative for fever, chills and diaphoresis.  Gastrointestinal: Negative for nausea, vomiting and diarrhea.  Musculoskeletal: Positive for joint swelling.  Skin: Positive for rash.      Objective:   Physical Exam  Constitutional: She is oriented to person, place, and time. She appears well-developed and well-nourished. No distress.  BP 102/58 mmHg  Pulse 73  Temp(Src) 99.1 F (37.3 C)  Resp 14  Ht 5\' 3"  (1.6 m)  Wt 122 lb (55.339 kg)  BMI 21.62 kg/m2  SpO2 96%   HENT:  Head: Normocephalic and atraumatic.  Right Ear: External ear normal.  Left Ear: External ear normal.  Cardiovascular: Normal rate and regular rhythm.   Pulmonary/Chest: Effort normal and breath sounds normal. No respiratory distress. She has no wheezes. She has no rales. She exhibits no tenderness.  Musculoskeletal: Normal range of motion. She exhibits no edema or tenderness.  Bump on foot- no significant findings except for overgrown toenails.   Neurological: She is alert and oriented to person, place, and time. No cranial nerve deficit. She exhibits normal muscle tone. Coordination normal.  Skin: Skin is warm and dry. No rash noted. She is not diaphoretic.  Psychiatric: She has a normal mood and affect. Her behavior is normal. Judgment and thought  content normal.      Assessment & Plan:

## 2015-03-07 ENCOUNTER — Telehealth: Payer: Self-pay | Admitting: *Deleted

## 2015-03-07 NOTE — Telephone Encounter (Signed)
pts son called states he forgot to mention pt has a rash which is causing itching to left side of her buttocks and in between. Have been using derma cloud ,but it has not been healing.  Please advise

## 2015-03-08 NOTE — Telephone Encounter (Signed)
Son called back and wanted to wait until next week to schedule apt.  Apt made for Monday with C.Doss

## 2015-03-08 NOTE — Telephone Encounter (Signed)
Yes, we can move up visit if needed. 

## 2015-03-08 NOTE — Telephone Encounter (Signed)
Left message on pts sons VM to return call to schedule appoint

## 2015-03-08 NOTE — Telephone Encounter (Signed)
I looked for the rash and did not see one. We discussed this during the visit. He was unsure that there was a rash also during the visit. We had reviewed all of that with the son (using the derma cloud). Pt needs to be seen by Dr. Dan HumphreysWalker sooner than the scheduled appointment in August. Can we move her up?

## 2015-03-14 ENCOUNTER — Ambulatory Visit: Payer: Medicare Other | Admitting: Nurse Practitioner

## 2015-03-14 ENCOUNTER — Encounter: Payer: Self-pay | Admitting: Internal Medicine

## 2015-03-14 ENCOUNTER — Ambulatory Visit (INDEPENDENT_AMBULATORY_CARE_PROVIDER_SITE_OTHER): Payer: Medicare Other | Admitting: Internal Medicine

## 2015-03-14 VITALS — BP 130/72 | HR 80 | Temp 98.7°F | Ht 63.0 in | Wt 125.5 lb

## 2015-03-14 DIAGNOSIS — D649 Anemia, unspecified: Secondary | ICD-10-CM

## 2015-03-14 DIAGNOSIS — A0472 Enterocolitis due to Clostridium difficile, not specified as recurrent: Secondary | ICD-10-CM

## 2015-03-14 DIAGNOSIS — A047 Enterocolitis due to Clostridium difficile: Secondary | ICD-10-CM | POA: Diagnosis not present

## 2015-03-14 DIAGNOSIS — F0391 Unspecified dementia with behavioral disturbance: Secondary | ICD-10-CM

## 2015-03-14 DIAGNOSIS — F03918 Unspecified dementia, unspecified severity, with other behavioral disturbance: Secondary | ICD-10-CM

## 2015-03-14 NOTE — Assessment & Plan Note (Signed)
Dementia. Symptomatically stable. Continue current medications.

## 2015-03-14 NOTE — Progress Notes (Signed)
   Subjective:    Patient ID: Kelli Matthews, female    DOB: 04/07/1941, 74 y.o.   MRN: [id number]  HPI  73YO female presents for follow up.  She is unable to provide any history. Her daughter is with her. She was recently hospitalized with CDiff colitis and pancreatitis. She has chronic diarrhea and it is difficult to tell if this has changed at all. She continues on oral Vancomycin. She has not complained of abdominal pain, or had fevers. She has been noted to be itching her abdomen and legs.   Past medical, surgical, family and social history per today's encounter.  Review of Systems  Unable to perform ROS: Dementia       Objective:    BP 130/72 mmHg  Pulse 80  Temp(Src) 98.7 F (37.1 C) (Oral)  Ht 5\' 3"  (1.6 m)  Wt 125 lb 8 oz (56.926 kg)  BMI 22.24 kg/m2  SpO2 97% Physical Exam  Constitutional: She is oriented to person, place, and time. She appears well-developed and well-nourished. No distress.  HENT:  Head: Normocephalic and atraumatic.  Right Ear: External ear normal.  Left Ear: External ear normal.  Nose: Nose normal.  Mouth/Throat: Oropharynx is clear and moist. No oropharyngeal exudate.  Eyes: Conjunctivae are normal. Pupils are equal, round, and reactive to light. Right eye exhibits no discharge. Left eye exhibits no discharge. No scleral icterus.  Neck: Normal range of motion. Neck supple. No tracheal deviation present. No thyromegaly present.  Cardiovascular: Normal rate, regular rhythm, normal heart sounds and intact distal pulses.  Exam reveals no gallop and no friction rub.   No murmur heard. Pulmonary/Chest: Effort normal and breath sounds normal. No respiratory distress. She has no wheezes. She has no rales. She exhibits no tenderness.  Musculoskeletal: Normal range of motion. She exhibits no edema or tenderness.  Lymphadenopathy:    She has no cervical adenopathy.  Neurological: She is alert and oriented to person, place, and time. No cranial nerve  deficit. She exhibits normal muscle tone. Coordination normal.  Skin: Skin is warm and dry. No rash noted. She is not diaphoretic. No erythema. No pallor.  Skin over trunk and legs noted to be dry, but no obvious rash. Skin over groin and buttocks dry and intact with no rash.  Psychiatric: She has a normal mood and affect. Her behavior is normal. Judgment and thought content normal.          Assessment & Plan:   Problem List Items Addressed This Visit      Unprioritized   Anemia - Primary    Recent Hgb was low at 8.2 after admission for pancreatitis. Will recheck CBC and check ferritin with labs today. Stool ifob.      Relevant Orders   CBC   Ferritin   C. difficile diarrhea    Per notes from hospital stay, she has been on oral vancomycin since early June 2016. Will recheck stool for CDiff. If negative, will stop Vancomycin. Note that she has chronic watery diarrhea for years.      Relevant Orders   Stool C-Diff Toxin Assay   Dementia with behavioral disturbance    Dementia. Symptomatically stable. Continue current medications.          Return in about 4 weeks (around 04/11/2015) for Recheck.

## 2015-03-14 NOTE — Assessment & Plan Note (Signed)
Per notes from hospital stay, she has been on oral vancomycin since early June 2016. Will recheck stool for CDiff. If negative, will stop Vancomycin. Note that she has chronic watery diarrhea for years.

## 2015-03-14 NOTE — Patient Instructions (Signed)
Labs today.  We will send stool sample for CDiff.  Follow up in 4 weeks.

## 2015-03-14 NOTE — Progress Notes (Signed)
Pre visit review using our clinic review tool, if applicable. No additional management support is needed unless otherwise documented below in the visit note. 

## 2015-03-14 NOTE — Assessment & Plan Note (Signed)
Recent Hgb was low at 8.2 after admission for pancreatitis. Will recheck CBC and check ferritin with labs today. Stool ifob.

## 2015-03-15 ENCOUNTER — Telehealth: Payer: Self-pay | Admitting: *Deleted

## 2015-03-15 ENCOUNTER — Other Ambulatory Visit: Payer: Self-pay | Admitting: Internal Medicine

## 2015-03-15 ENCOUNTER — Other Ambulatory Visit (INDEPENDENT_AMBULATORY_CARE_PROVIDER_SITE_OTHER): Payer: Medicare Other

## 2015-03-15 DIAGNOSIS — D649 Anemia, unspecified: Secondary | ICD-10-CM

## 2015-03-15 LAB — FERRITIN: Ferritin: 17.2 ng/mL (ref 10.0–291.0)

## 2015-03-15 NOTE — Telephone Encounter (Signed)
I am planning to stop Vancomycin, just waiting on second check for CDiff. First check per daughter's report was negative.

## 2015-03-15 NOTE — Telephone Encounter (Signed)
[name] need [facility] actual Rx for the Vancomycin.  Please advise

## 2015-03-15 NOTE — Telephone Encounter (Signed)
pts daughter also called requesting whether another C Diff sample is needed.  Spoke with Dr Dan HumphreysWalker, she states she does want the C Diff.  pts daughter is aware and verbalizes understanding.  also spoke with Claris CheMargaret advised Vancomycin to be stopped once C Diff checked.

## 2015-03-15 NOTE — Telephone Encounter (Signed)
Lab called pt purple top clotted, called son - he is in [place] and he gave me his sister called her, she will be bring in patient in today at 4:45 for re-collect

## 2015-03-16 ENCOUNTER — Telehealth: Payer: Self-pay | Admitting: *Deleted

## 2015-03-16 ENCOUNTER — Other Ambulatory Visit: Payer: Medicare Other

## 2015-03-16 LAB — CBC WITH DIFFERENTIAL/PLATELET
BASOS PCT: 0.4 % (ref 0.0–3.0)
Basophils Absolute: 0 10*3/uL (ref 0.0–0.1)
Eosinophils Absolute: 0.2 10*3/uL (ref 0.0–0.7)
Eosinophils Relative: 1.8 % (ref 0.0–5.0)
HCT: 27 % — ABNORMAL LOW (ref 36.0–46.0)
Hemoglobin: 8.5 g/dL — ABNORMAL LOW (ref 12.0–15.0)
LYMPHS PCT: 17.1 % (ref 12.0–46.0)
Lymphs Abs: 2.2 10*3/uL (ref 0.7–4.0)
MCHC: 31.7 g/dL (ref 30.0–36.0)
MCV: 82.9 fl (ref 78.0–100.0)
MONOS PCT: 6.2 % (ref 3.0–12.0)
Monocytes Absolute: 0.8 10*3/uL (ref 0.1–1.0)
NEUTROS ABS: 9.4 10*3/uL — AB (ref 1.4–7.7)
Neutrophils Relative %: 74.5 % (ref 43.0–77.0)
PLATELETS: 589 10*3/uL — AB (ref 150.0–400.0)
RBC: 3.25 Mil/uL — ABNORMAL LOW (ref 3.87–5.11)
RDW: 16.9 % — ABNORMAL HIGH (ref 11.5–15.5)
WBC: 12.6 10*3/uL — ABNORMAL HIGH (ref 4.0–10.5)

## 2015-03-16 NOTE — Telephone Encounter (Signed)
[facility] called that there wasn't enough stool in the container (stool C-diff) , they need another sample, tried calling pt son didn't get an answer

## 2015-03-16 NOTE — Addendum Note (Signed)
Addended by: Montine CircleMALDONADO, Antwanette Wesche D on: 03/16/2015 10:44 AM   Modules accepted: Orders

## 2015-03-17 ENCOUNTER — Encounter: Payer: Self-pay | Admitting: Nurse Practitioner

## 2015-03-17 DIAGNOSIS — R21 Rash and other nonspecific skin eruption: Secondary | ICD-10-CM | POA: Insufficient documentation

## 2015-03-17 NOTE — Assessment & Plan Note (Signed)
No rash on exam. Will follow.

## 2015-03-17 NOTE — Assessment & Plan Note (Signed)
Will obtain HFP, BMET, CBC w/ diff, Amylase lipase to check on levels from hospital d/c

## 2015-03-17 NOTE — Telephone Encounter (Signed)
Patient daughter brought in another sample, send out today

## 2015-03-18 LAB — CLOSTRIDIUM DIFFICILE BY PCR: CDIFFPCR: NOT DETECTED

## 2015-03-18 LAB — C. DIFFICILE GDH AND TOXIN A/B
C. DIFFICILE GDH: DETECTED — AB
C. difficile Toxin A/B: NOT DETECTED

## 2015-03-23 ENCOUNTER — Telehealth: Payer: Self-pay | Admitting: Internal Medicine

## 2015-03-23 NOTE — Telephone Encounter (Signed)
Alex from [facility] called requesting Aspirin EC be changed to Aspirin Chewable as pts meds has to be crushed.  Alex also requests Norco Rx be d/c'd as per pts daughters request.  The med orders should be faxed to [facility] at [phone].  Please advise

## 2015-03-23 NOTE — Telephone Encounter (Signed)
Fine to d/c Norco and change aspirin to chewable

## 2015-03-24 ENCOUNTER — Other Ambulatory Visit: Payer: Self-pay | Admitting: *Deleted

## 2015-03-24 MED ORDER — ASPIRIN 81 MG PO CHEW: 81.0000 mg | CHEWABLE_TABLET | Freq: Every day | ORAL | Status: DC

## 2015-03-24 NOTE — Telephone Encounter (Signed)
Norco Rx d/c'd and aspirin chewable sent to pharmacy.

## 2015-03-28 ENCOUNTER — Telehealth: Payer: Self-pay | Admitting: *Deleted

## 2015-03-28 NOTE — Telephone Encounter (Signed)
pts son called states an order to d/c Norco needs to sent to [facility].

## 2015-03-28 NOTE — Telephone Encounter (Signed)
Fine to send order to d/c Norco

## 2015-03-29 ENCOUNTER — Encounter: Payer: Self-pay | Admitting: *Deleted

## 2015-03-29 NOTE — Telephone Encounter (Signed)
D/C letter faxed to [employer]

## 2015-03-31 ENCOUNTER — Telehealth: Payer: Self-pay | Admitting: *Deleted

## 2015-03-31 NOTE — Telephone Encounter (Signed)
Maybe they are referring to [facility]?? If yes, I have not received any results on this

## 2015-03-31 NOTE — Telephone Encounter (Signed)
The assistant living was requesting lab result for patient. Claris Che stated that the facility has a medication on hold and before they can D/C the med, they have to make sure the results are negative.

## 2015-03-31 NOTE — Telephone Encounter (Signed)
Can you advise? 

## 2015-04-01 NOTE — Telephone Encounter (Signed)
Advised okay to pick up test

## 2015-04-05 ENCOUNTER — Ambulatory Visit (INDEPENDENT_AMBULATORY_CARE_PROVIDER_SITE_OTHER): Payer: Medicare Other | Admitting: Podiatry

## 2015-04-05 DIAGNOSIS — B351 Tinea unguium: Secondary | ICD-10-CM | POA: Diagnosis not present

## 2015-04-05 DIAGNOSIS — M79676 Pain in unspecified toe(s): Secondary | ICD-10-CM | POA: Diagnosis not present

## 2015-04-06 NOTE — Progress Notes (Signed)
Subjective:     Patient ID: Kelli Matthews, female   DOB: [birth year], 74 y.o.   MRN: [id number]  HPI Subjective: 74 y.o. presents to the office today for painful, elongated, thickened toenails. Denies any redness or drainage around the nails.She presents to the office with her son so states he is unsure why she is really here. Denies any systemic complaints such as fevers, chills, nausea, vomiting.      Review of Systems     Objective:   Physical Exam Awake, alert 3, NAD DP/PT pulses palpable, CRT less than 3 seconds Protective sensation difficult to evaluate given her mental status with Dorann Ou monofilament, Achilles tendon reflex intact.  Nails hypertrophic, dystrophic, elongated, brittle, discolored 10. There isapparent  tenderness overlying the nails 1-5 bilaterally. There is no surrounding erythema or drainage along the nail sites. No open lesions or pre-ulcerative lesions are identified. No other areas of tenderness bilateral lower extremities. No overlying edema, erythema, increased warmth.No pain with calf compression, swelling, warmth, erythema    Assessment:     74 year old female with symptomatic onychomycosis    Plan:     -Treatment options including alternatives, risks, complications were discussed -Nails sharply debrided 10 without complication/bleeding. -Discussed daily foot inspection. If there are any changes, to call the office immediately.  -Follow-up in 3 months or sooner if any problems are to arise. In the meantime, encouraged to call the office with any questions, concerns, changes symptoms.  Ovid Curd, DPM

## 2015-04-25 ENCOUNTER — Ambulatory Visit (INDEPENDENT_AMBULATORY_CARE_PROVIDER_SITE_OTHER): Payer: Medicare Other | Admitting: Internal Medicine

## 2015-04-25 ENCOUNTER — Encounter: Payer: Self-pay | Admitting: Internal Medicine

## 2015-04-25 VITALS — BP 139/71 | HR 67 | Temp 98.7°F | Ht 63.0 in

## 2015-04-25 DIAGNOSIS — D649 Anemia, unspecified: Secondary | ICD-10-CM

## 2015-04-25 DIAGNOSIS — F0391 Unspecified dementia with behavioral disturbance: Secondary | ICD-10-CM

## 2015-04-25 DIAGNOSIS — K529 Noninfective gastroenteritis and colitis, unspecified: Secondary | ICD-10-CM | POA: Diagnosis not present

## 2015-04-25 DIAGNOSIS — F03918 Unspecified dementia, unspecified severity, with other behavioral disturbance: Secondary | ICD-10-CM

## 2015-04-25 LAB — CBC WITH DIFFERENTIAL/PLATELET
BASOS PCT: 3.5 % — AB (ref 0.0–3.0)
Basophils Absolute: 0.3 10*3/uL — ABNORMAL HIGH (ref 0.0–0.1)
Eosinophils Absolute: 0.2 10*3/uL (ref 0.0–0.7)
Eosinophils Relative: 2.1 % (ref 0.0–5.0)
HEMATOCRIT: 28.1 % — AB (ref 36.0–46.0)
Hemoglobin: 8.7 g/dL — ABNORMAL LOW (ref 12.0–15.0)
LYMPHS ABS: 2.1 10*3/uL (ref 0.7–4.0)
Lymphocytes Relative: 24.1 % (ref 12.0–46.0)
MCHC: 30.9 g/dL (ref 30.0–36.0)
MCV: 77 fl — ABNORMAL LOW (ref 78.0–100.0)
Monocytes Absolute: 0.7 10*3/uL (ref 0.1–1.0)
Monocytes Relative: 8 % (ref 3.0–12.0)
NEUTROS ABS: 5.3 10*3/uL (ref 1.4–7.7)
NEUTROS PCT: 62.3 % (ref 43.0–77.0)
PLATELETS: 387 10*3/uL (ref 150.0–400.0)
RBC: 3.65 Mil/uL — ABNORMAL LOW (ref 3.87–5.11)
RDW: 16.3 % — ABNORMAL HIGH (ref 11.5–15.5)
WBC: 8.6 10*3/uL (ref 4.0–10.5)

## 2015-04-25 LAB — COMPREHENSIVE METABOLIC PANEL
ALT: 13 U/L (ref 0–35)
AST: 25 U/L (ref 0–37)
Albumin: 3.7 g/dL (ref 3.5–5.2)
Alkaline Phosphatase: 101 U/L (ref 39–117)
BUN: 24 mg/dL — ABNORMAL HIGH (ref 6–23)
CALCIUM: 8.9 mg/dL (ref 8.4–10.5)
CHLORIDE: 103 meq/L (ref 96–112)
CO2: 27 meq/L (ref 19–32)
CREATININE: 1.1 mg/dL (ref 0.40–1.20)
GFR: 51.6 mL/min — ABNORMAL LOW (ref >60.00)
GLUCOSE: 89 mg/dL (ref 70–99)
Potassium: 3.8 mEq/L (ref 3.5–5.1)
Sodium: 138 mEq/L (ref 135–145)
Total Bilirubin: 0.2 mg/dL (ref 0.2–1.2)
Total Protein: 7.1 g/dL (ref 6.0–8.3)

## 2015-04-25 NOTE — Assessment & Plan Note (Signed)
Symptomatically stable. Continues to live at [facility]. Will ask for hospital bed with rails to help prevent falls. Follow up 3 months and prn.

## 2015-04-25 NOTE — Progress Notes (Signed)
Pre visit review using our clinic review tool, if applicable. No additional management support is needed unless otherwise documented below in the visit note. 

## 2015-04-25 NOTE — Patient Instructions (Signed)
Labs today

## 2015-04-25 NOTE — Assessment & Plan Note (Signed)
Symptoms stable. Will recheck electrolytes today.

## 2015-04-25 NOTE — Assessment & Plan Note (Signed)
Repeat CBC with labs today. 

## 2015-04-25 NOTE — Progress Notes (Signed)
Subjective:    Patient ID: Kelli Matthews, female    DOB: December 08, 1940, 74 y.o.   MRN: [id number]  HPI  74YO female presents for follow up with son. All history obtained from patient's son.  Son notes she is slower to respond, but generally stable. Overall, though much improved compared to right after hospitalization.  Continues to have some loose stools, but c/w baseline.  Wt Readings from Last 3 Encounters:  03/14/15 125 lb 8 oz (56.926 kg)  03/04/15 122 lb (55.339 kg)  02/17/15 117 lb 8 oz (53.298 kg)     Past Medical History  Diagnosis Date  . Dementia 2012  . Maxillary sinus fracture     after fall 2013, with LOC  . Hypertension 2001    on medicaitons for at least 10 years  . Other esophagitis 2011  . Personal history of tobacco use, presenting hazards to health 2011  . H/O cystitis 2010  . Arthritis 2005  . Rectal mass 2010  . Alzheimer disease   . Chronic kidney disease   . Anemia   . Alzheimer disease   . Kidney stone   . Hypokalemia   . Hepatic cyst    Family History  Problem Relation Age of Onset  . Alzheimer's disease Mother   . Stroke Mother   . Ovarian cancer Sister   . Early death Sister 59  . Depression Son    Past Surgical History  Procedure Laterality Date  . Breast reduction surgery  2003  . Breast lumpectomy  1985    Benign per pt, right breast  . Colonoscopy  2004, 2010,2011  . Breast reduction surgery  2003or 2004    by Dr. Genevieve Norlander  . Colon surgery  2010    polyps, Dr. Lemar Livings, hemicolectomy  . Vein ligation and stripping  2005  . Wrist surgery  2005  . Upper gi endoscopy  2011  . Varicose veins  2011    laser surgery  . Cystoscopy/ureteroscopy/holmium laser/stent placement Left 01/04/2015    Procedure: CYSTOSCOPY/LEFTURETEROSCOPY/HOLMIUM LASER/LEFT STENT PLACEMENT/LEFT RETROGRADE;  Surgeon: Vanna Scotland, MD;  Location: [facility] ORS;  Service: Urology;  Laterality: Left;  . Cystoscopy w/ ureteral stent removal Left 02/18/2015    Procedure:  CYSTOSCOPY WITH STENT REMOVAL;  Surgeon: Vanna Scotland, MD;  Location: [facility] ORS;  Service: Urology;  Laterality: Left;   Social History   Social History  . Marital Status: Married    Spouse Name: N/A  . Number of Children: 2  . Years of Education: N/A   Occupational History  . Retired - Corporate investment banker working at [employer] History Main Topics  . Smoking status: Former Smoker -- 17 years    Types: Cigarettes    Quit date: 08/28/1995  . Smokeless tobacco: Never Used     Comment: quit in 1977  . Alcohol Use: No  . Drug Use: No  . Sexual Activity: No   Other Topics Concern  . None   Social History Narrative   Regular Exercise -  1 hour during work daily (at [facility] [employer])   Daily Caffeine Use:  2 - 4 daily     Review of Systems  Unable to perform ROS: Dementia       Objective:    BP 139/71 mmHg  Pulse 67  Temp(Src) 98.7 F (37.1 C) (Oral)  Ht 5\' 3"  (1.6 m)  SpO2 99% Physical Exam  Constitutional: She is oriented to person, place, and time. She appears well-developed and  well-nourished. No distress.  HENT:  Head: Normocephalic and atraumatic.  Right Ear: External ear normal.  Left Ear: External ear normal.  Nose: Nose normal.  Mouth/Throat: Oropharynx is clear and moist.  Eyes: Conjunctivae are normal. Pupils are equal, round, and reactive to light. Right eye exhibits no discharge. Left eye exhibits no discharge. No scleral icterus.  Neck: Normal range of motion. Neck supple. No tracheal deviation present. No thyromegaly present.  Cardiovascular: Normal rate, regular rhythm, normal heart sounds and intact distal pulses.  Exam reveals no gallop and no friction rub.   No murmur heard. Pulmonary/Chest: Effort normal and breath sounds normal. No respiratory distress. She has no wheezes. She has no rales. She exhibits no tenderness.  Musculoskeletal: Normal range of motion. She exhibits no edema or tenderness.  Lymphadenopathy:    She has no cervical adenopathy.    Neurological: She is alert and oriented to person, place, and time. No cranial nerve deficit. She exhibits normal muscle tone. Coordination normal.  Skin: Skin is warm and dry. No rash noted. She is not diaphoretic. No erythema. No pallor.  Psychiatric: She has a normal mood and affect. She is withdrawn. Cognition and memory are impaired. She expresses impulsivity and inappropriate judgment. She is noncommunicative. She exhibits abnormal recent memory and abnormal remote memory.          Assessment & Plan:   Problem List Items Addressed This Visit      Unprioritized   Anemia    Repeat CBC with labs today.      Relevant Orders   CBC with Differential/Platelet (Completed)   Chronic diarrhea    Symptoms stable. Will recheck electrolytes today.      Relevant Orders   Comprehensive metabolic panel (Completed)   Dementia with behavioral disturbance - Primary    Symptomatically stable. Continues to live at [facility]. Will ask for hospital bed with rails to help prevent falls. Follow up 3 months and prn.          Return in about 3 months (around 07/26/2015) for Recheck.

## 2015-04-27 DIAGNOSIS — G309 Alzheimer's disease, unspecified: Secondary | ICD-10-CM | POA: Diagnosis not present

## 2015-07-05 ENCOUNTER — Encounter: Payer: Self-pay | Admitting: Sports Medicine

## 2015-07-05 ENCOUNTER — Ambulatory Visit: Payer: Medicare Other

## 2015-07-05 ENCOUNTER — Ambulatory Visit (INDEPENDENT_AMBULATORY_CARE_PROVIDER_SITE_OTHER): Payer: Medicare Other | Admitting: Sports Medicine

## 2015-07-05 DIAGNOSIS — M79676 Pain in unspecified toe(s): Secondary | ICD-10-CM | POA: Diagnosis not present

## 2015-07-05 DIAGNOSIS — B351 Tinea unguium: Secondary | ICD-10-CM

## 2015-07-05 NOTE — Progress Notes (Signed)
Patient ID: Kelli Matthews, female   DOB: 01/06/1941, 74 y.o.   MRN: [id number] Subjective: Kelli Matthews is a 74 y.o. female patient seen today in office with complaint of painful thickened and elongated toenails; unable to trim. Assisted by son who states that mom has dementia. Denies history of Diabetes, Neuropathy, or Vascular disease. No other pedal complaints at this time.   Patient currently lives at assisted living facility.   Patient Active Problem List   Diagnosis Date Noted  . Rash and nonspecific skin eruption 03/17/2015  . Retained ureteral stent 02/17/2015  . Hypokalemia 01/22/2015  . Elevated LFTs 01/22/2015  . Ankle fracture 01/21/2015  . HLD (hyperlipidemia) 01/21/2015  . Anemia 12/31/2013  . Chronic diarrhea 11/05/2013  . Acute pancreatitis 09/08/2013  . Personal history of colonic polyps 06/04/2013  . Sleep disturbance 01/26/2013  . Hypertension 09/27/2011  . Dementia with behavioral disturbance 09/27/2011   Current Outpatient Prescriptions on File Prior to Visit  Medication Sig Dispense Refill  . acetaminophen (TYLENOL) 325 MG tablet Take 650 mg by mouth every 4 (four) hours as needed for mild pain or fever.    Marland Kitchen. aspirin (ASPIRIN CHILDRENS) 81 MG chewable tablet Chew 1 tablet (81 mg total) by mouth daily. 30 tablet 6  . aspirin EC 81 MG tablet Take 81 mg by mouth daily.    . cholecalciferol (VITAMIN D) 1000 UNITS tablet TAKE ONE TABLET BY MOUTH EACH DAY. 30 tablet PRN  . citalopram (CELEXA) 20 MG tablet Take 1 tablet (20 mg total) by mouth daily. (Patient taking differently: Take 10 mg by mouth 2 (two) times daily. ) 30 tablet 0  . desonide (DESOWEN) 0.05 % cream Apply 1 application topically as needed (to face).     . donepezil (ARICEPT) 10 MG tablet TAKE ONE TABLET BY MOUTH EACH DAY AT BEDTIME. 30 tablet 11  . fluticasone (FLONASE) 50 MCG/ACT nasal spray Two sprays in each nostril daily. 16 g 6  . gentamicin cream (GARAMYCIN) 0.1 % APPLY TWICE DAILY TO SKIN  LESIONS ON UPPER BACK 30 g 1  . Loperamide HCl (IMODIUM PO) Take by mouth daily.    . memantine (NAMENDA) 10 MG tablet Take 10 mg by mouth 2 (two) times daily.    . metoprolol succinate (TOPROL-XL) 25 MG 24 hr tablet Take 1 tablet (25 mg total) by mouth daily. 30 tablet 6  . mirtazapine (REMERON) 15 MG tablet Take 30 mg by mouth at bedtime.     . Multiple Vitamins-Minerals (THERAVIM-M) TABS TAKE ONE TABLET BY MOUTH EACH DAY. 30 tablet 5  . QUEtiapine (SEROQUEL) 50 MG tablet Take 50 mg by mouth at bedtime.     No current facility-administered medications on file prior to visit.   Allergies  Allergen Reactions  . Amoxicillin-Pot Clavulanate Nausea And Vomiting  . Augmentin [Amoxicillin-Pot Clavulanate] Rash  . Latex Rash       Objective: Physical Exam  General: Well developed, nourished, no acute distress, awake, alert and oriented x1; almost non-verbal   Vascular: Dorsalis pedis artery 1/4 bilateral, Posterior tibial artery 1/4 bilateral, skin temperature warm to warm proximal to distal bilateral lower extremities, no varicosities, pedal hair present bilateral.  Neurological: Gross sensation present via light touch bilateral. Protective, epicritic, and vibratory sensation hard to discern due to patient non-verbal/mental status   Dermatological: Skin is warm, dry, and supple bilateral, Nails 1-10 are tender, long, thick, and discolored with mild subungal debris, no webspace macerations present bilateral, no open lesions present bilateral, no  callus/corns/hyperkeratotic tissue present bilateral. No signs of infection bilateral.  Musculoskeletal: No boney deformities noted bilateral. Muscular strength within normal limits without pain or limitation on range of motion. No pain with calf compression bilateral.  Assessment and Plan:  Problem List Items Addressed This Visit    None    Visit Diagnoses    Dermatophytosis of nail    -  Primary    Pain of toe, unspecified laterality           -Examined patient.  -Discussed treatment options for painful mycotic nails. -Mechanically debrided and reduced mycotic nails with sterile nail nipper and dremel nail file without incident. -Cont with good supportive shoes daily.  -Patient to return in 4 months for follow up evaluation or sooner if symptoms worsen.  Asencion Islam, DPM

## 2015-07-25 ENCOUNTER — Ambulatory Visit: Payer: Medicare Other | Admitting: Internal Medicine

## 2015-08-01 ENCOUNTER — Ambulatory Visit: Payer: Medicare Other | Admitting: Internal Medicine

## 2015-08-25 ENCOUNTER — Encounter: Payer: Self-pay | Admitting: Internal Medicine

## 2015-08-25 ENCOUNTER — Ambulatory Visit (INDEPENDENT_AMBULATORY_CARE_PROVIDER_SITE_OTHER): Payer: Medicare Other | Admitting: Internal Medicine

## 2015-08-25 VITALS — BP 125/76 | HR 67 | Temp 98.9°F | Ht 63.0 in | Wt 126.4 lb

## 2015-08-25 DIAGNOSIS — D649 Anemia, unspecified: Secondary | ICD-10-CM | POA: Diagnosis not present

## 2015-08-25 DIAGNOSIS — F0391 Unspecified dementia with behavioral disturbance: Secondary | ICD-10-CM | POA: Diagnosis not present

## 2015-08-25 DIAGNOSIS — F03918 Unspecified dementia, unspecified severity, with other behavioral disturbance: Secondary | ICD-10-CM

## 2015-08-25 NOTE — Assessment & Plan Note (Signed)
Progressive dementia, now with more agitation. We discussed potentially increasing the dose of the Seroquel to 75mg  at bedtime. Her daughter prefers to wait for now and [facility]

## 2015-08-25 NOTE — Progress Notes (Signed)
Pre visit review using our clinic review tool, if applicable. No additional management support is needed unless otherwise documented below in the visit note. 

## 2015-08-25 NOTE — Progress Notes (Signed)
Subjective:    Patient ID: Kelli FlemingRachel E Zucco, female    DOB: 11/22/1940, 74 y.o.   MRN: [id number]  HPI  74YO female with dementia presents for follow up.  History taken from daughter.  Recently more agitated. Particularly late afternoon. Sometimes, hitting herself on the legs. Seen by Hospice nurse. No changes made to medication. Planning to transition to [facility] for weekly monitoring in nursing facility.  Wt Readings from Last 3 Encounters:  08/25/15 126 lb 6 oz (57.323 kg)  03/14/15 125 lb 8 oz (56.926 kg)  03/04/15 122 lb (55.339 kg)   BP Readings from Last 3 Encounters:  08/25/15 125/76  04/25/15 139/71  03/14/15 130/72    Past Medical History  Diagnosis Date  . Dementia 2012  . Maxillary sinus fracture (HCC)     after fall 2013, with LOC  . Hypertension 2001    on medicaitons for at least 10 years  . Other esophagitis 2011  . Personal history of tobacco use, presenting hazards to health 2011  . H/O cystitis 2010  . Arthritis 2005  . Rectal mass 2010  . Alzheimer disease   . Chronic kidney disease   . Anemia   . Alzheimer disease   . Kidney stone   . Hypokalemia   . Hepatic cyst    Family History  Problem Relation Age of Onset  . Alzheimer's disease Mother   . Stroke Mother   . Ovarian cancer Sister   . Early death Sister [id number]  . Depression Son    Past Surgical History  Procedure Laterality Date  . Breast reduction surgery  2003  . Breast lumpectomy  1985    Benign per pt, right breast  . Colonoscopy  2004, 2010,2011  . Breast reduction surgery  2003or 2004    by Dr. Genevieve NorlanderAu  . Colon surgery  2010    polyps, Dr. Lemar LivingsByrnett, hemicolectomy  . Vein ligation and stripping  2005  . Wrist surgery  2005  . Upper gi endoscopy  2011  . Varicose veins  2011    laser surgery  . Cystoscopy/ureteroscopy/holmium laser/stent placement Left 01/04/2015    Procedure: CYSTOSCOPY/LEFTURETEROSCOPY/HOLMIUM LASER/LEFT STENT PLACEMENT/LEFT RETROGRADE;  Surgeon:  Vanna ScotlandAshley Brandon, MD;  Location: [facility];  Service: Urology;  Laterality: Left;  . Cystoscopy w/ ureteral stent removal Left 02/18/2015    Procedure: CYSTOSCOPY WITH STENT REMOVAL;  Surgeon: Vanna ScotlandAshley Brandon, MD;  Location: [facility] OR[facility];  Service: Urology;  Laterality: Left;   Social History   Social History  . Marital Status: Married    Spouse Name: N/A  . Number of Children: 2  . Years of Education: N/A   Occupational History  . Retired - Corporate investment bankerTeacher/ working at [employer]    Social History Main Topics  . Smoking status: Former Smoker -- 17 years    Types: Cigarettes    Quit date: 08/28/1995  . Smokeless tobacco: Never Used     Comment: quit in 1977  . Alcohol Use: No  . Drug Use: No  . Sexual Activity: No   Other Topics Concern  . None   Social History Narrative   Regular Exercise -  1 hour during work daily (at [employer])   Daily Caffeine Use:  2 - 4 daily     Review of Systems  Unable to perform ROS: Dementia       Objective:    BP 125/76 mmHg  Pulse 67  Temp(Src) 98.9 F (37.2 C) (Oral)  Ht 5\' 3"  (  1.6 m)  Wt 126 lb 6 oz (57.323 kg)  BMI 22.39 kg/m2  SpO2 100% Physical Exam  Constitutional: She is oriented to person, place, and time. She appears well-developed and well-nourished. No distress.  HENT:  Head: Normocephalic and atraumatic.  Right Ear: External ear normal.  Left Ear: External ear normal.  Nose: Nose normal.  Mouth/Throat: Oropharynx is clear and moist. No oropharyngeal exudate.  Eyes: Conjunctivae are normal. Pupils are equal, round, and reactive to light. Right eye exhibits no discharge. Left eye exhibits no discharge. No scleral icterus.  Neck: Normal range of motion. Neck supple. No tracheal deviation present. No thyromegaly present.  Cardiovascular: Normal rate, regular rhythm, normal heart sounds and intact distal pulses.  Exam reveals no gallop and no friction rub.   No murmur heard. Pulmonary/Chest: Effort normal and breath sounds normal. No  respiratory distress. She has no wheezes. She has no rales. She exhibits no tenderness.  Musculoskeletal: Normal range of motion. She exhibits no edema or tenderness.  Lymphadenopathy:    She has no cervical adenopathy.  Neurological: She is alert and oriented to person, place, and time. No cranial nerve deficit. She exhibits normal muscle tone. Coordination normal.  Skin: Skin is warm and dry. Ecchymosis noted. No rash noted. She is not diaphoretic. No erythema. No pallor.     Psychiatric: She has a normal mood and affect. Her speech is delayed. She is withdrawn. Cognition and memory are impaired. She expresses impulsivity and inappropriate judgment. She exhibits abnormal recent memory and abnormal remote memory.          Assessment & Plan:   Problem List Items Addressed This Visit      Unprioritized   Anemia    Lab Results  Component Value Date   HGB 8.7 Repeated and verified X2.* 04/25/2015   Progressive anemia. Previous ferritin low at 17, c/w IDA. Likely poor po intake and chronic GI loss. Attempted to add iron supplement in past. Not clear that she was taking this. Discussed with daughter potentially repeating labs today. She prefers to hold off for now.       Dementia with behavioral disturbance - Primary    Progressive dementia, now with more agitation. We discussed potentially increasing the dose of the Seroquel to  at bedtime. Her daughter prefers to wait for now and follow up with her new caregivers at [facility].           Return if symptoms worsen or fail to improve.

## 2015-08-25 NOTE — Assessment & Plan Note (Signed)
Lab Results  Component Value Date   HGB 8.7 Repeated and verified X2.* 04/25/2015   Progressive anemia. Previous ferritin low at 17, c/w IDA. Likely poor po intake and chronic GI loss. Attempted to add iron supplement in past. Not clear that she was taking this. Discussed with daughter potentially repeating labs today. She prefers to hold off for now.

## 2015-10-04 ENCOUNTER — Ambulatory Visit: Payer: Medicare Other | Admitting: Sports Medicine

## 2015-10-17 ENCOUNTER — Emergency Department
Admission: EM | Admit: 2015-10-17 | Discharge: 2015-10-17 | Disposition: A | Discharge status: Home / Self Care | Payer: Medicare Other | Attending: Emergency Medicine | Admitting: Emergency Medicine

## 2015-10-17 ENCOUNTER — Emergency Department: Payer: Medicare Other

## 2015-10-17 DIAGNOSIS — I129 Hypertensive chronic kidney disease with stage 1 through stage 4 chronic kidney disease, or unspecified chronic kidney disease: Secondary | ICD-10-CM | POA: Diagnosis not present

## 2015-10-17 DIAGNOSIS — F0281 Dementia in other diseases classified elsewhere with behavioral disturbance: Secondary | ICD-10-CM | POA: Diagnosis not present

## 2015-10-17 DIAGNOSIS — Z91040 Latex allergy status: Secondary | ICD-10-CM | POA: Insufficient documentation

## 2015-10-17 DIAGNOSIS — Z880 Allergy status to penicillin: Secondary | ICD-10-CM | POA: Diagnosis not present

## 2015-10-17 DIAGNOSIS — Z87891 Personal history of nicotine dependence: Secondary | ICD-10-CM | POA: Diagnosis not present

## 2015-10-17 DIAGNOSIS — Z7982 Long term (current) use of aspirin: Secondary | ICD-10-CM | POA: Insufficient documentation

## 2015-10-17 DIAGNOSIS — Z792 Long term (current) use of antibiotics: Secondary | ICD-10-CM | POA: Diagnosis not present

## 2015-10-17 DIAGNOSIS — R569 Unspecified convulsions: Secondary | ICD-10-CM

## 2015-10-17 DIAGNOSIS — R4189 Other symptoms and signs involving cognitive functions and awareness: Secondary | ICD-10-CM

## 2015-10-17 DIAGNOSIS — Z791 Long term (current) use of non-steroidal anti-inflammatories (NSAID): Secondary | ICD-10-CM | POA: Diagnosis not present

## 2015-10-17 DIAGNOSIS — N189 Chronic kidney disease, unspecified: Secondary | ICD-10-CM | POA: Diagnosis not present

## 2015-10-17 DIAGNOSIS — G309 Alzheimer's disease, unspecified: Secondary | ICD-10-CM | POA: Insufficient documentation

## 2015-10-17 LAB — URINALYSIS COMPLETE WITH MICROSCOPIC (ARMC ONLY)
BILIRUBIN URINE: NEGATIVE
Bacteria, UA: NONE SEEN
GLUCOSE, UA: NEGATIVE mg/dL
HGB URINE DIPSTICK: NEGATIVE
KETONES UR: NEGATIVE mg/dL
Nitrite: NEGATIVE
PH: 5 (ref 5.0–8.0)
Protein, ur: NEGATIVE mg/dL
Specific Gravity, Urine: 1.013 (ref 1.005–1.030)
Squamous Epithelial / LPF: NONE SEEN

## 2015-10-17 LAB — CBC
HCT: 33.6 % — ABNORMAL LOW (ref 35.0–47.0)
Hemoglobin: 11 g/dL — ABNORMAL LOW (ref 12.0–16.0)
MCH: 26.9 pg (ref 26.0–34.0)
MCHC: 32.7 g/dL (ref 32.0–36.0)
MCV: 82.2 fL (ref 80.0–100.0)
PLATELETS: 343 10*3/uL (ref 150–440)
RBC: 4.08 MIL/uL (ref 3.80–5.20)
RDW: 16 % — AB (ref 11.5–14.5)
WBC: 8.3 10*3/uL (ref 3.6–11.0)

## 2015-10-17 LAB — BASIC METABOLIC PANEL
Anion gap: 8 (ref 5–15)
BUN: 25 mg/dL — AB (ref 6–20)
CO2: 25 mmol/L (ref 22–32)
CREATININE: 0.77 mg/dL (ref 0.44–1.00)
Calcium: 8.8 mg/dL — ABNORMAL LOW (ref 8.9–10.3)
Chloride: 110 mmol/L (ref 101–111)
GFR calc Af Amer: >60 mL/min (ref >60)
GFR calc non Af Amer: >60 mL/min (ref >60)
Glucose, Bld: 85 mg/dL (ref 65–99)
Potassium: 4.1 mmol/L (ref 3.5–5.1)
SODIUM: 143 mmol/L (ref 135–145)

## 2015-10-17 LAB — TROPONIN I: Troponin I: <0.03 ng/mL (ref <0.031)

## 2015-10-17 NOTE — ED Notes (Signed)
EMS pt from [facility]-staff getting pt dressed and began having seizure like activity; history of seizures; small lac to bottom lip; EMS reports pt with difficulty following commands; staff reports pt is nonverbal at baseline

## 2015-10-17 NOTE — ED Provider Notes (Signed)
[facility] Emergency Department Provider Note  ____________________________________________  Time seen: Approximately 615 AM  I have reviewed the triage vital signs and the nursing notes.   HISTORY  Chief Complaint Seizures  Patient with history of dementia and nonverbal and unable to answer questions.  HPI Kelli Matthews is a 75 y.o. female who comes in from her nursing home today. The patient has a history of dementia and is unable to participate in the history. The history is obtained from the nurse who obtained it from EMS. EMS was called to [facility] for an unresponsive patient with seizure-like activity. The patient has a history of dementia and is typically unresponsive. EMS could not describe the type of seizure activity and the patient has had seizures in the past. The patient does arouse when talked to and she does attempt to follow some commands. The patient is unable to verbalize distress.   Past Medical History  Diagnosis Date  . Dementia 2012  . Maxillary sinus fracture (HCC)     after fall 2013, with LOC  . Hypertension 2001    on medicaitons for at least 10 years  . Other esophagitis 2011  . Personal history of tobacco use, presenting hazards to health 2011  . H/O cystitis 2010  . Arthritis 2005  . Rectal mass 2010  . Alzheimer disease   . Chronic kidney disease   . Anemia   . Alzheimer disease   . Kidney stone   . Hypokalemia   . Hepatic cyst     Patient Active Problem List   Diagnosis Date Noted  . Rash and nonspecific skin eruption 03/17/2015  . Retained ureteral stent 02/17/2015  . Hypokalemia 01/22/2015  . Elevated LFTs 01/22/2015  . Ankle fracture 01/21/2015  . HLD (hyperlipidemia) 01/21/2015  . Anemia 12/31/2013  . Chronic diarrhea 11/05/2013  . Personal history of colonic polyps 06/04/2013  . Sleep disturbance 01/26/2013  . Hypertension 09/27/2011  . Dementia with behavioral disturbance 09/27/2011    Past  Surgical History  Procedure Laterality Date  . Breast reduction surgery  2003  . Breast lumpectomy  1985    Benign per pt, right breast  . Colonoscopy  2004, 2010,2011  . Breast reduction surgery  2003or 2004    by Dr. Genevieve Norlander  . Colon surgery  2010    polyps, Dr. Lemar Livings, hemicolectomy  . Vein ligation and stripping  2005  . Wrist surgery  2005  . Upper gi endoscopy  2011  . Varicose veins  2011    laser surgery  . Cystoscopy/ureteroscopy/holmium laser/stent placement Left 01/04/2015    Procedure: CYSTOSCOPY/LEFTURETEROSCOPY/HOLMIUM LASER/LEFT STENT PLACEMENT/LEFT RETROGRADE;  Surgeon: Vanna Scotland, MD;  Location: [facility] OR[facility];  Service: Urology;  Laterality: Left;  . Cystoscopy w/ ureteral stent removal Left 02/18/2015    Procedure: CYSTOSCOPY WITH STENT REMOVAL;  Surgeon: Vanna Scotland, MD;  Location: [facility] OR[facility];  Service: Urology;  Laterality: Left;    Current Outpatient Rx  Name  Route  Sig  Dispense  Refill  . Skin Protectants, Misc. (EUCERIN) cream   Topical   Apply 1 application topically daily.         Marland Kitchen acetaminophen (TYLENOL) 325 MG tablet   Oral   Take 650 mg by mouth every 4 (four) hours as needed for mild pain or fever.         Marland Kitchen aspirin (ASPIRIN CHILDRENS) 81 MG chewable tablet   Oral   Chew 1 tablet (81 mg total) by mouth daily.  30 tablet   6   . cholecalciferol (VITAMIN D) 1000 UNITS tablet      TAKE ONE TABLET BY MOUTH EACH DAY.   30 tablet   PRN   . citalopram (CELEXA) 20 MG tablet   Oral   Take 1 tablet (20 mg total) by mouth daily.   30 tablet   0   . desonide (DESOWEN) 0.05 % cream   Topical   Apply 1 application topically as needed (to face).          . donepezil (ARICEPT) 10 MG tablet      TAKE ONE TABLET BY MOUTH EACH DAY AT BEDTIME.   30 tablet   11   . fluticasone (FLONASE) 50 MCG/ACT nasal spray      Two sprays in each nostril daily.   16 g   6   . gentamicin cream (GARAMYCIN) 0.1 %      APPLY TWICE DAILY TO SKIN LESIONS ON  UPPER BACK   30 g   1   . Loperamide HCl (IMODIUM PO)   Oral   Take by mouth daily.         . memantine (NAMENDA) 10 MG tablet   Oral   Take 10 mg by mouth 2 (two) times daily.         . metoprolol succinate (TOPROL-XL) 25 MG 24 hr tablet   Oral   Take 1 tablet (25 mg total) by mouth daily.   30 tablet   6   . mirtazapine (REMERON) 15 MG tablet   Oral   Take 30 mg by mouth at bedtime.          . Multiple Vitamins-Minerals (THERAVIM-M) TABS      TAKE ONE TABLET BY MOUTH EACH DAY.   30 tablet   5   . QUEtiapine (SEROQUEL) 50 MG tablet   Oral   Take 50 mg by mouth at bedtime.           Allergies Amoxicillin-pot clavulanate; Augmentin; and Latex  Family History  Problem Relation Age of Onset  . Alzheimer's disease Mother   . Stroke Mother   . Ovarian cancer Sister   . Early death Sister 64  . Depression Son     Social History Social History  Substance Use Topics  . Smoking status: Former Smoker -- 17 years    Types: Cigarettes    Quit date: 08/28/1995  . Smokeless tobacco: Never Used     Comment: quit in 1977  . Alcohol Use: No    Review of Systems Unable to assess due to patient being nonverbal and with dementia  ____________________________________________   PHYSICAL EXAM:  VITAL SIGNS: ED Triage Vitals  Enc Vitals Group     BP 10/17/15 0606 144/74 mmHg     Pulse Rate 10/17/15 0606 72     Resp 10/17/15 0606 18     Temp 10/17/15 0606 98.1 F (36.7 C)     Temp Source 10/17/15 0606 Axillary     SpO2 10/17/15 0606 96 %     Weight 10/17/15 0606 122 lb 2.2 oz (55.4 kg)     Height 10/17/15 0606  (1.549 m)     Head Cir --      Peak Flow --      Pain Score --      Pain Loc --      Pain Edu? --      Excl. in GC? --     Constitutional: Alert  Well appearing and in no acute distress. Eyes: Conjunctivae are normal. PERRL. EOMI. Head: Atraumatic. Nose: No congestion/rhinnorhea. Mouth/Throat: Mucous membranes are moist.  Oropharynx  non-erythematous. Cardiovascular: Normal rate, regular rhythm. Grossly normal heart sounds.  Good peripheral circulation. Respiratory: Normal respiratory effort.  No retractions. Lungs CTAB. Gastrointestinal: Soft and nontender. No distention. Positive bowel sounds Musculoskeletal: No lower extremity tenderness nor edema.   Neurologic:  Patient nonverbal at baseline he should not fully following commands, does squeeze hands and does look up but does not smile or follow any commands. She is unable to tell me if she has good sensation. She is moving all extremities. Skin:  Skin is warm, dry and intact.  Psychiatric: Mood and affect are normal.   ____________________________________________   LABS (all labs ordered are listed, but only abnormal results are displayed)  Labs Reviewed  BASIC METABOLIC PANEL - Abnormal; Notable for the following:    BUN 25 (*)    Calcium 8.8 (*)    All other components within normal limits  CBC - Abnormal; Notable for the following:    Hemoglobin 11.0 (*)    HCT 33.6 (*)    RDW 16.0 (*)    All other components within normal limits  URINALYSIS COMPLETEWITH MICROSCOPIC ([facility] ONLY)  TROPONIN I  CBG MONITORING, ED   ____________________________________________  EKG  ED ECG REPORT I, Rebecka Apley, the attending physician, personally viewed and interpreted this ECG.   Date: 10/17/2015  EKG Time: 610  Rate: 71  Rhythm: normal sinus rhythm  Axis: normal  Intervals:none  ST&T Change: none  ____________________________________________  RADIOLOGY  CT head: No acute abnormality, stable moderate to marked diffuse cerebral and cerebellar atrophy, stable mild chronic small vessel white matter ischemic changes in both frontal lobes. ____________________________________________   PROCEDURES  Procedure(s) performed: None  Critical Care performed: No  ____________________________________________   INITIAL IMPRESSION / ASSESSMENT AND PLAN /  ED COURSE  Pertinent labs & imaging results that were available during my care of the patient were reviewed by me and considered in my medical decision making (see chart for details).  This is 75 year old female who was sent in from her nursing home for possible seizure-like activity. The patient also had some unresponsive episode at her nursing home. We do not have a good story as the nursing home did not send any information nor is the patient verbal. According to the paperwork the patient is unresponsive and nonverbal at baseline. The patient also has a history of behavioral disturbances. We will check some blood work as well as a CT and some urine and then disposition the patient.  At this time I am still awaiting the results of the patient's urinalysis and troponin. If the patient's blood work looks unremarkable and she is still at her baseline she'll be discharged back to her nursing home. The patient's care was signed out to Dr. Shaune Pollack who will follow-up the results of the blood work. ____________________________________________   FINAL CLINICAL IMPRESSION(S) / ED DIAGNOSES  Final diagnoses:  Seizure (HCC)  Unresponsive episode      Rebecka Apley, MD 10/17/15 [phone]

## 2015-10-17 NOTE — Discharge Instructions (Signed)
No certain cause was found for the unresponsive episode, however the exam and evaluation are reassuring today in the emergency department. Discuss further possible workup for possible seizure activity with your primary care physician.  Return to the emergency department for any worsening condition including fever, altered mental status, pain, trouble breathing, weakness, numbness, or seizure activity.

## 2015-10-17 NOTE — ED Provider Notes (Signed)
[facility]  I accepted care from Dr. Zenda Alpers  ____________________________________________    LABS (pertinent positives/negatives)  Urinalysis trace leukocytes, 6-30 white blood cells no bacteria and negative nitrites Troponin less than 0.03   ____________________________________________    RADIOLOGY All xrays were viewed by me. Imaging interpreted by radiologist.  CT without contrast:   IMPRESSION: 1. No acute abnormality. 2. Stable moderate to marked diffuse cerebral and cerebellar atrophy. 3. Stable mild chronic small vessel white matter ischemic changes in both frontal lobes.  ____________________________________________   PROCEDURES  Procedure(s) performed: None  Critical Care performed: None  ____________________________________________   INITIAL IMPRESSION / ASSESSMENT AND PLAN / ED COURSE   Pertinent labs & imaging results that were available during my care of the patient were reviewed by me and considered in my medical decision making (see chart for details).  Patient's hospice nurse at the bedside I updated her.  Unclear etiology based on history for a possible fall versus syncope versus seizure. Per the hospice nurse the patient is at her baseline now which is awake and alert, but nonverbal.  Urinalysis negative. Troponin negative. CT the head negative.  Given that she is at baseline, and reassuring exam and evaluation, patient will be discharged home. Recommend PCP follow-up to discuss any further evaluation if there is continued concern for possible seizure as etiology of the altered mental status episode today.  CONSULTATIONS: None    Patient / Family / Caregiver informed of clinical course, medical decision-making process, and agree with plan.   I discussed return precautions, follow-up instructions, and discharged instructions with patient and/or family.     ____________________________________________   FINAL  CLINICAL IMPRESSION(S) / ED DIAGNOSES  Final diagnoses:  Seizure (HCC)  Unresponsive episode        Governor Rooks, MD 10/17/15 1037

## 2015-11-18 IMAGING — CR DG CHEST 1V PORT
1 series · 1 of 1 positions shown · non-contrast
Comparison: Chest radiograph performed 10/09/2013

CLINICAL DATA: Acute onset of diarrhea.  Initial encounter.

EXAM:
PORTABLE CHEST - 1 VIEW

[ap]
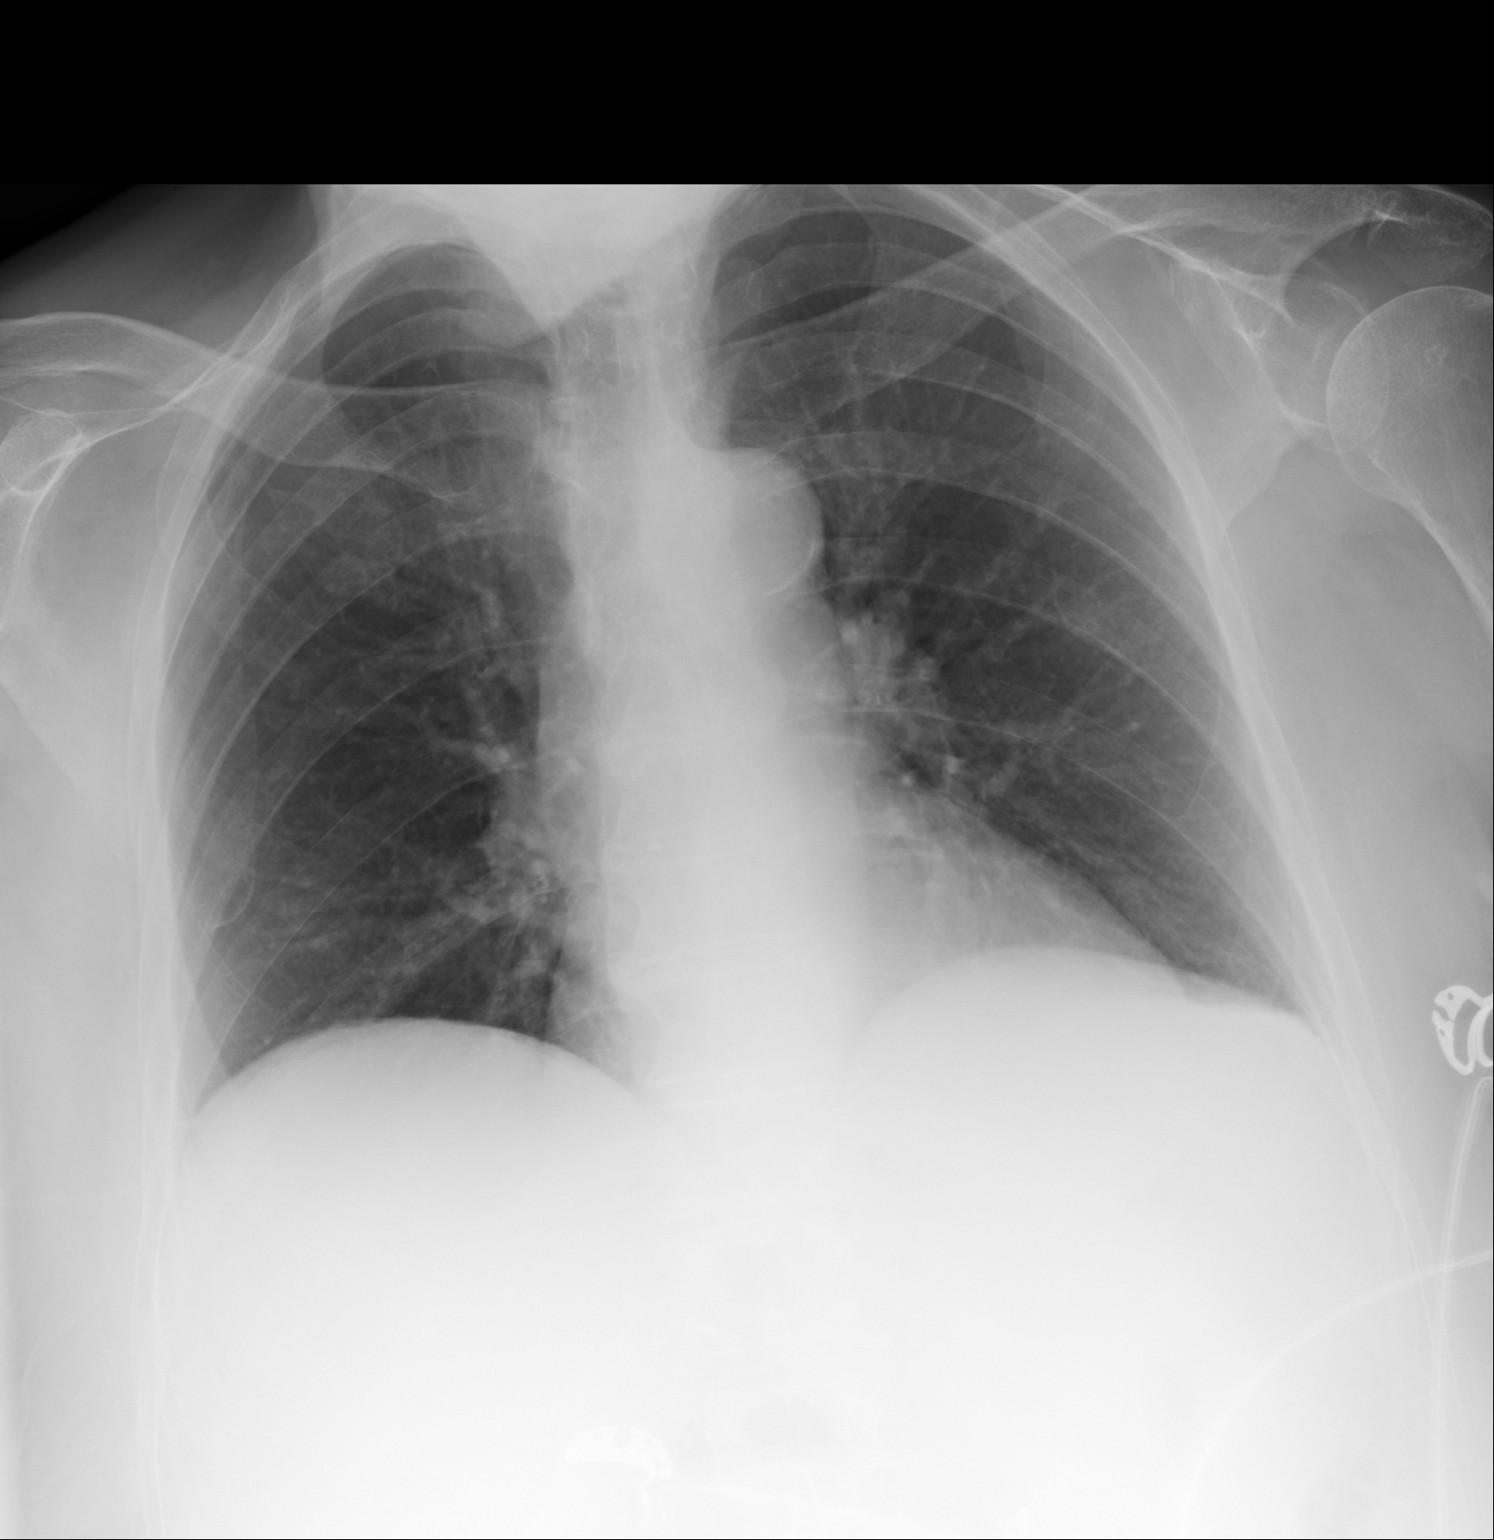

[1 of 1 positions shown; findings below may reference images not displayed]

FINDINGS: The lungs are well-aerated and clear. There is no evidence of focal
opacification, pleural effusion or pneumothorax.

The cardiomediastinal silhouette is borderline normal in size. No
acute osseous abnormalities are seen.
IMPRESSION: No acute cardiopulmonary process seen.

## 2015-12-14 IMAGING — CR DG ABDOMEN 1V
1 series · 1 of 1 positions shown · non-contrast
Comparison: CT scan 01/22/2015

CLINICAL DATA: Kidney stones, left ureteral stent for 3 weeks

EXAM:
ABDOMEN - 1 VIEW

[dg abd 1 view]
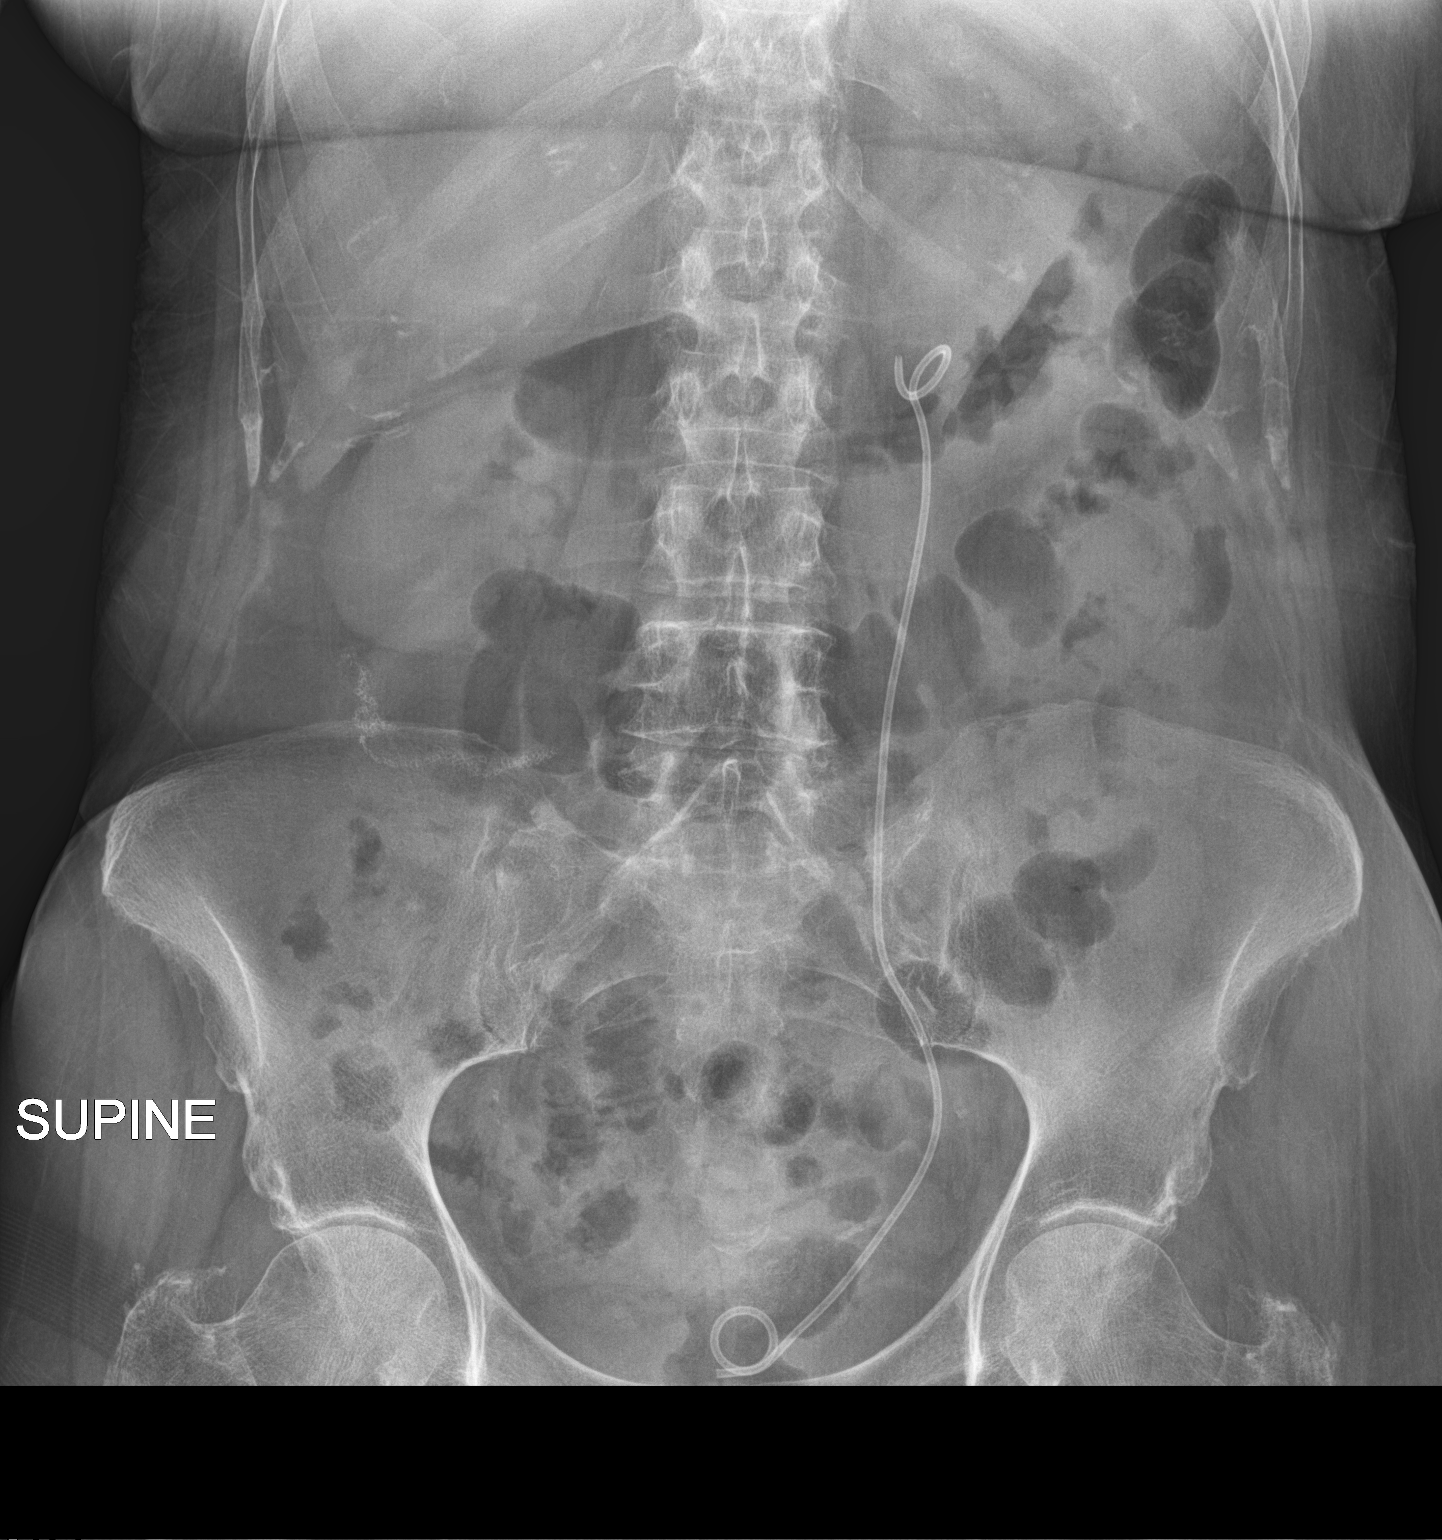

[1 of 1 positions shown; findings below may reference images not displayed]

FINDINGS: There is normal small bowel gas pattern. There is a left ureteral
stent in place. Surgical sutures are noted in mid right abdomen.
Left kidney is obscured by bowel gas. The previous left
nephrolithiasis not clearly identified as the kidney is obscured.
IMPRESSION: Normal small bowel gas pattern. Left ureteral stent in place. Left
kidney is obscured by bowel gas. The previous left renal stone is
not clearly identified.

## 2015-12-14 IMAGING — CR DG ABDOMEN 1V
1 series · 1 of 1 positions shown · non-contrast
Comparison: 02/17/2015

CLINICAL DATA: Retained left ureteral stent.  C difficile colitis.

EXAM:
ABDOMEN - 1 VIEW

[ap]
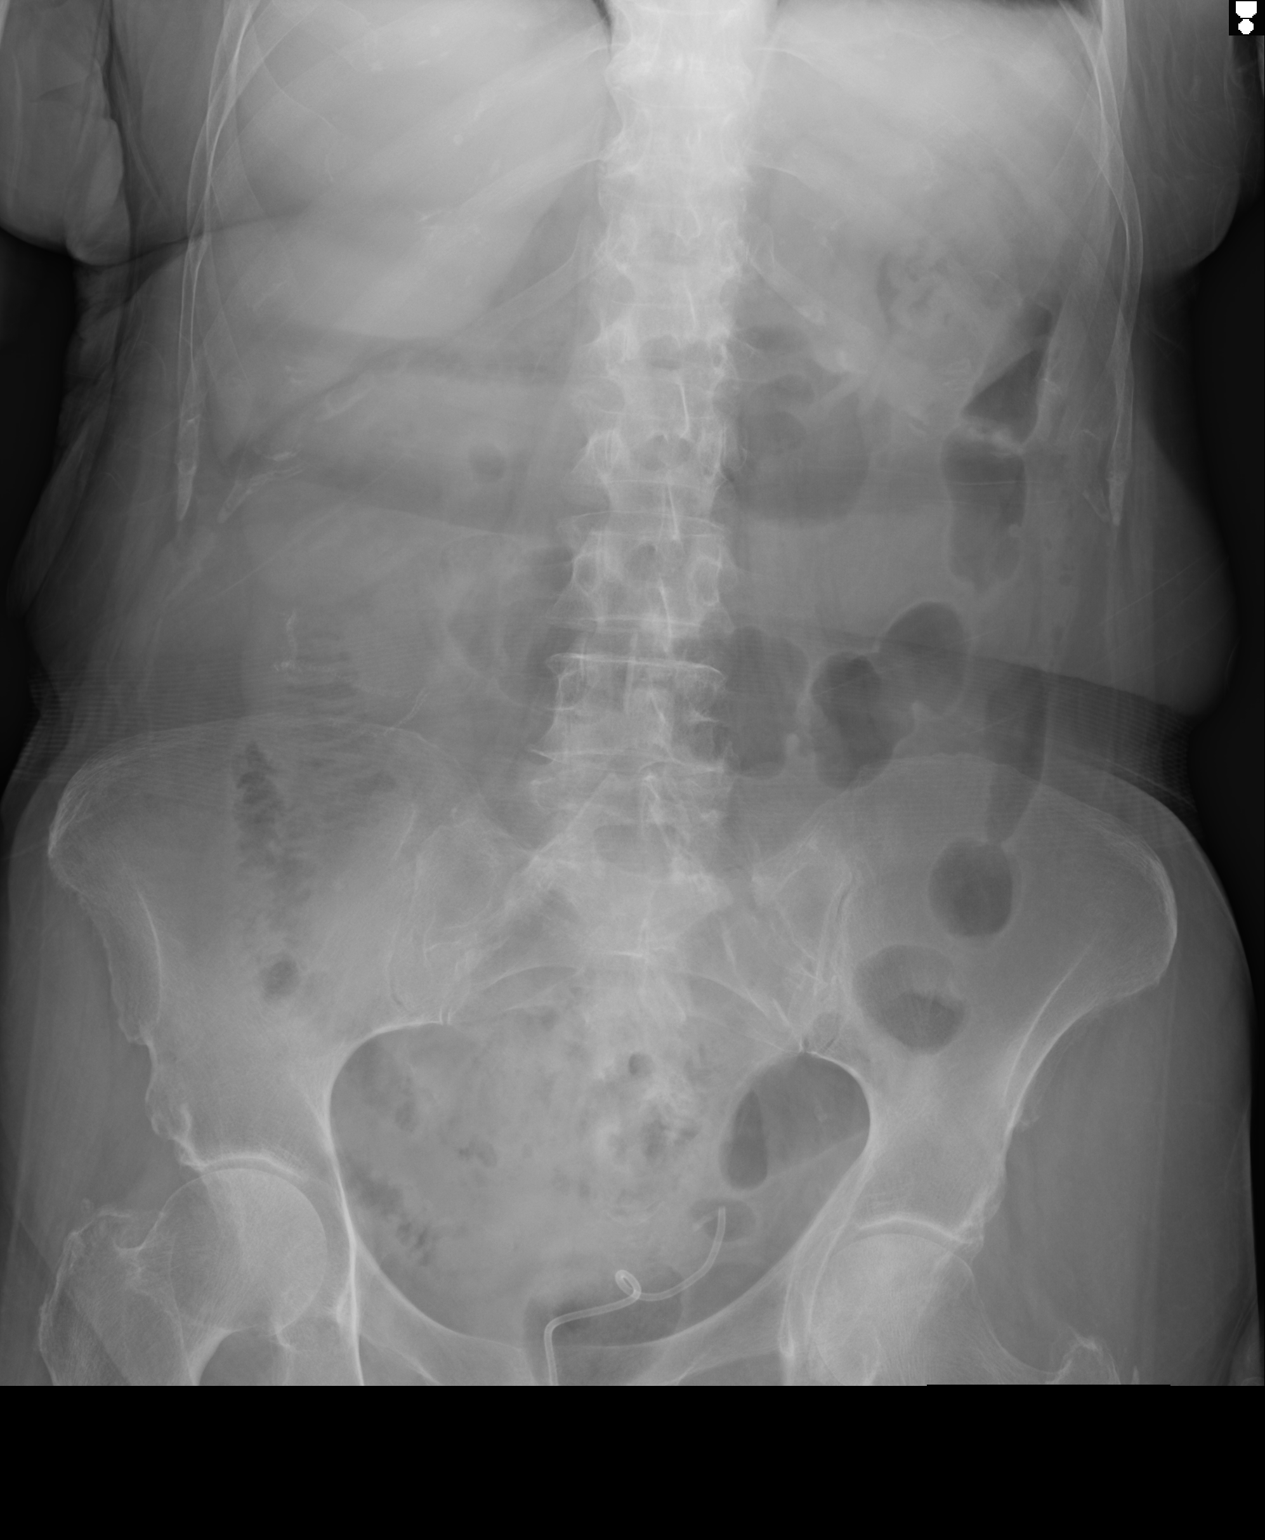

[1 of 1 positions shown; findings below may reference images not displayed]

FINDINGS: Previously seen left ureteral stent is now seen overlying the
urinary bladder and extending through the urethra.

Mild small bowel wall thickening noted in the right abdomen, but no
evidence of dilated bowel loops.
IMPRESSION: Retained left ureteral stent now seen overlying the urinary bladder
and extending through the urethra.

Mild small bowel wall thickening and right abdomen. No evidence of
dilated bowel loops.

## 2016-05-20 ENCOUNTER — Emergency Department: Payer: Medicare Other

## 2016-05-20 ENCOUNTER — Encounter: Payer: Self-pay | Admitting: Specialist

## 2016-05-20 ENCOUNTER — Inpatient Hospital Stay
Admission: EM | Admit: 2016-05-20 | Discharge: 2016-05-24 | DRG: 871 | Disposition: A | Discharge status: Hospice | Payer: Medicare Other | Attending: Internal Medicine | Admitting: Internal Medicine

## 2016-05-20 DIAGNOSIS — E860 Dehydration: Secondary | ICD-10-CM | POA: Diagnosis present

## 2016-05-20 DIAGNOSIS — B9620 Unspecified Escherichia coli [E. coli] as the cause of diseases classified elsewhere: Secondary | ICD-10-CM | POA: Diagnosis present

## 2016-05-20 DIAGNOSIS — K921 Melena: Secondary | ICD-10-CM | POA: Diagnosis present

## 2016-05-20 DIAGNOSIS — I248 Other forms of acute ischemic heart disease: Secondary | ICD-10-CM | POA: Diagnosis present

## 2016-05-20 DIAGNOSIS — E872 Acidosis: Secondary | ICD-10-CM | POA: Diagnosis present

## 2016-05-20 DIAGNOSIS — K7200 Acute and subacute hepatic failure without coma: Secondary | ICD-10-CM | POA: Diagnosis present

## 2016-05-20 DIAGNOSIS — N179 Acute kidney failure, unspecified: Secondary | ICD-10-CM | POA: Diagnosis present

## 2016-05-20 DIAGNOSIS — Z87440 Personal history of urinary (tract) infections: Secondary | ICD-10-CM

## 2016-05-20 DIAGNOSIS — G9341 Metabolic encephalopathy: Secondary | ICD-10-CM | POA: Diagnosis present

## 2016-05-20 DIAGNOSIS — R627 Adult failure to thrive: Secondary | ICD-10-CM | POA: Diagnosis present

## 2016-05-20 DIAGNOSIS — E870 Hyperosmolality and hypernatremia: Secondary | ICD-10-CM | POA: Diagnosis present

## 2016-05-20 DIAGNOSIS — Z515 Encounter for palliative care: Secondary | ICD-10-CM | POA: Diagnosis present

## 2016-05-20 DIAGNOSIS — B964 Proteus (mirabilis) (morganii) as the cause of diseases classified elsewhere: Secondary | ICD-10-CM | POA: Diagnosis present

## 2016-05-20 DIAGNOSIS — G309 Alzheimer's disease, unspecified: Secondary | ICD-10-CM | POA: Diagnosis present

## 2016-05-20 DIAGNOSIS — R319 Hematuria, unspecified: Secondary | ICD-10-CM | POA: Diagnosis not present

## 2016-05-20 DIAGNOSIS — F0280 Dementia in other diseases classified elsewhere without behavioral disturbance: Secondary | ICD-10-CM | POA: Diagnosis present

## 2016-05-20 DIAGNOSIS — N200 Calculus of kidney: Secondary | ICD-10-CM | POA: Diagnosis present

## 2016-05-20 DIAGNOSIS — Z66 Do not resuscitate: Secondary | ICD-10-CM | POA: Diagnosis present

## 2016-05-20 DIAGNOSIS — F0391 Unspecified dementia with behavioral disturbance: Secondary | ICD-10-CM | POA: Diagnosis not present

## 2016-05-20 DIAGNOSIS — Z87891 Personal history of nicotine dependence: Secondary | ICD-10-CM

## 2016-05-20 DIAGNOSIS — A419 Sepsis, unspecified organism: Secondary | ICD-10-CM | POA: Diagnosis present

## 2016-05-20 DIAGNOSIS — D649 Anemia, unspecified: Secondary | ICD-10-CM | POA: Diagnosis present

## 2016-05-20 DIAGNOSIS — N390 Urinary tract infection, site not specified: Secondary | ICD-10-CM | POA: Diagnosis present

## 2016-05-20 DIAGNOSIS — R4182 Altered mental status, unspecified: Secondary | ICD-10-CM

## 2016-05-20 DIAGNOSIS — I129 Hypertensive chronic kidney disease with stage 1 through stage 4 chronic kidney disease, or unspecified chronic kidney disease: Secondary | ICD-10-CM | POA: Diagnosis present

## 2016-05-20 DIAGNOSIS — N189 Chronic kidney disease, unspecified: Secondary | ICD-10-CM | POA: Diagnosis present

## 2016-05-20 DIAGNOSIS — M6282 Rhabdomyolysis: Secondary | ICD-10-CM | POA: Diagnosis present

## 2016-05-20 DIAGNOSIS — R6520 Severe sepsis without septic shock: Secondary | ICD-10-CM | POA: Diagnosis present

## 2016-05-20 DIAGNOSIS — E876 Hypokalemia: Secondary | ICD-10-CM | POA: Diagnosis not present

## 2016-05-20 DIAGNOSIS — Z789 Other specified health status: Secondary | ICD-10-CM | POA: Diagnosis not present

## 2016-05-20 LAB — URINALYSIS COMPLETE WITH MICROSCOPIC (ARMC ONLY)
Bilirubin Urine: NEGATIVE
Glucose, UA: NEGATIVE mg/dL
Ketones, ur: NEGATIVE mg/dL
Nitrite: NEGATIVE
PH: 5 (ref 5.0–8.0)
PROTEIN: 100 mg/dL — AB
SPECIFIC GRAVITY, URINE: 1.02 (ref 1.005–1.030)

## 2016-05-20 LAB — CBC WITH DIFFERENTIAL/PLATELET
BASOS ABS: 0 10*3/uL (ref 0–0.1)
Band Neutrophils: 1 %
Basophils Relative: 0 %
EOS PCT: 0 %
Eosinophils Absolute: 0 10*3/uL (ref 0–0.7)
HEMATOCRIT: 38 % (ref 35.0–47.0)
HEMOGLOBIN: 12.4 g/dL (ref 12.0–16.0)
Lymphocytes Relative: 7 %
Lymphs Abs: 1.6 10*3/uL (ref 1.0–3.6)
MCH: 26.9 pg (ref 26.0–34.0)
MCHC: 32.7 g/dL (ref 32.0–36.0)
MCV: 82.3 fL (ref 80.0–100.0)
MONOS PCT: 12 %
Monocytes Absolute: 2.7 10*3/uL — ABNORMAL HIGH (ref 0.2–0.9)
NEUTROS ABS: 18 10*3/uL — AB (ref 1.4–6.5)
Neutrophils Relative %: 80 %
Platelets: 171 10*3/uL (ref 150–440)
RBC: 4.62 MIL/uL (ref 3.80–5.20)
RDW: 17 % — AB (ref 11.5–14.5)
WBC: 22.3 10*3/uL — AB (ref 3.6–11.0)

## 2016-05-20 LAB — COMPREHENSIVE METABOLIC PANEL
ALT: 762 U/L — ABNORMAL HIGH (ref 14–54)
ANION GAP: 25 — AB (ref 5–15)
AST: 1067 U/L — ABNORMAL HIGH (ref 15–41)
Albumin: 3.2 g/dL — ABNORMAL LOW (ref 3.5–5.0)
Alkaline Phosphatase: 173 U/L — ABNORMAL HIGH (ref 38–126)
BUN: 99 mg/dL — ABNORMAL HIGH (ref 6–20)
CHLORIDE: 107 mmol/L (ref 101–111)
CO2: 14 mmol/L — AB (ref 22–32)
Calcium: 7.1 mg/dL — ABNORMAL LOW (ref 8.9–10.3)
Creatinine, Ser: 5.84 mg/dL — ABNORMAL HIGH (ref 0.44–1.00)
GFR, EST AFRICAN AMERICAN: 7 mL/min — AB (ref >60)
GFR, EST NON AFRICAN AMERICAN: 6 mL/min — AB (ref >60)
Glucose, Bld: 150 mg/dL — ABNORMAL HIGH (ref 65–99)
Potassium: 3.8 mmol/L (ref 3.5–5.1)
SODIUM: 146 mmol/L — AB (ref 135–145)
Total Bilirubin: 0.9 mg/dL (ref 0.3–1.2)
Total Protein: 6.9 g/dL (ref 6.5–8.1)

## 2016-05-20 LAB — MRSA PCR SCREENING: MRSA by PCR: NEGATIVE

## 2016-05-20 LAB — PROTIME-INR
INR: 1.27
Prothrombin Time: 16 seconds — ABNORMAL HIGH (ref 11.4–15.2)

## 2016-05-20 LAB — BLOOD GAS, ARTERIAL
ACID-BASE DEFICIT: 10.9 mmol/L — AB (ref 0.0–2.0)
Allens test (pass/fail): POSITIVE — AB
BICARBONATE: 14.3 mmol/L — AB (ref 20.0–28.0)
FIO2: 0.21
O2 Saturation: 89.9 %
PCO2 ART: 29 mmHg — AB (ref 32.0–48.0)
PH ART: 7.3 — AB (ref 7.350–7.450)
PO2 ART: 65 mmHg — AB (ref 83.0–108.0)
Patient temperature: 37

## 2016-05-20 LAB — APTT: APTT: 31 s (ref 24–36)

## 2016-05-20 LAB — TYPE AND SCREEN
ABO/RH(D): A POS
ANTIBODY SCREEN: NEGATIVE

## 2016-05-20 LAB — CK: CK TOTAL: 6027 U/L — AB (ref 38–234)

## 2016-05-20 LAB — TROPONIN I
TROPONIN I: 0.36 ng/mL — AB (ref <0.03)
Troponin I: 0.28 ng/mL (ref <0.03)
Troponin I: 0.3 ng/mL (ref <0.03)
Troponin I: 0.25 ng/mL (ref <0.03)

## 2016-05-20 LAB — LACTIC ACID, PLASMA
Lactic Acid, Venous: 8.2 mmol/L (ref 0.5–1.9)
Lactic Acid, Venous: 4.1 mmol/L (ref 0.5–1.9)

## 2016-05-20 MED ORDER — SODIUM CHLORIDE 0.9 % IV SOLN
Freq: Once | INTRAVENOUS | Status: AC
  Administered 2016-05-20: 12:00:00 via INTRAVENOUS

## 2016-05-20 MED ORDER — ONDANSETRON HCL 4 MG PO TABS: 4.0000 mg | ORAL_TABLET | Freq: Four times a day (QID) | ORAL | Status: DC | PRN

## 2016-05-20 MED ORDER — SODIUM CHLORIDE 0.9 % IV SOLN
1.0000 g | INTRAVENOUS | Status: DC
  Administered 2016-05-20 – 2016-05-22 (×3): 1 g via INTRAVENOUS
  Filled 2016-05-20 (×4): qty 1

## 2016-05-20 MED ORDER — SODIUM CHLORIDE 0.9 % IV SOLN
INTRAVENOUS | Status: DC
  Administered 2016-05-20 – 2016-05-21 (×2): via INTRAVENOUS

## 2016-05-20 MED ORDER — SODIUM CHLORIDE 0.9 % IV SOLN
Freq: Once | INTRAVENOUS | Status: AC
  Administered 2016-05-20: 16:00:00 via INTRAVENOUS

## 2016-05-20 MED ORDER — SODIUM CHLORIDE 0.9% FLUSH
3.0000 mL | Freq: Two times a day (BID) | INTRAVENOUS | Status: DC
  Administered 2016-05-20 – 2016-05-24 (×7): 3 mL via INTRAVENOUS

## 2016-05-20 MED ORDER — MEMANTINE HCL 10 MG PO TABS
10.0000 mg | ORAL_TABLET | Freq: Two times a day (BID) | ORAL | Status: DC
  Administered 2016-05-20 – 2016-05-22 (×5): 10 mg via ORAL
  Filled 2016-05-20 (×3): qty 1
  Filled 2016-05-20 (×2): qty 2
  Filled 2016-05-20: qty 1

## 2016-05-20 MED ORDER — MIRTAZAPINE 15 MG PO TABS
30.0000 mg | ORAL_TABLET | Freq: Every day | ORAL | Status: DC
  Administered 2016-05-20 – 2016-05-23 (×4): 30 mg via ORAL
  Filled 2016-05-20 (×4): qty 2

## 2016-05-20 MED ORDER — LEVOFLOXACIN IN D5W 500 MG/100ML IV SOLN
500.0000 mg | Freq: Once | INTRAVENOUS | Status: AC
  Administered 2016-05-20: 500 mg via INTRAVENOUS
  Filled 2016-05-20: qty 100

## 2016-05-20 MED ORDER — ACETAMINOPHEN 325 MG PO TABS
650.0000 mg | ORAL_TABLET | Freq: Four times a day (QID) | ORAL | Status: DC | PRN
  Administered 2016-05-21: 650 mg via ORAL
  Filled 2016-05-20 (×2): qty 2

## 2016-05-20 MED ORDER — DONEPEZIL HCL 5 MG PO TABS
10.0000 mg | ORAL_TABLET | Freq: Every day | ORAL | Status: DC
  Administered 2016-05-20 – 2016-05-22 (×3): 10 mg via ORAL
  Filled 2016-05-20 (×3): qty 2

## 2016-05-20 MED ORDER — ONDANSETRON HCL 4 MG/2ML IJ SOLN: 4.0000 mg | Freq: Four times a day (QID) | INTRAMUSCULAR | Status: DC | PRN

## 2016-05-20 MED ORDER — HEPARIN SODIUM (PORCINE) 5000 UNIT/ML IJ SOLN
5000.0000 [IU] | Freq: Three times a day (TID) | INTRAMUSCULAR | Status: DC
  Administered 2016-05-20 – 2016-05-23 (×8): 5000 [IU] via SUBCUTANEOUS
  Filled 2016-05-20 (×8): qty 1

## 2016-05-20 MED ORDER — DEXTROSE 5 % IV SOLN
2.0000 g | Freq: Once | INTRAVENOUS | Status: AC
  Administered 2016-05-20: 2 g via INTRAVENOUS
  Filled 2016-05-20: qty 2

## 2016-05-20 MED ORDER — ACETAMINOPHEN 650 MG RE SUPP: 650.0000 mg | Freq: Four times a day (QID) | RECTAL | Status: DC | PRN

## 2016-05-20 MED ORDER — QUETIAPINE FUMARATE 25 MG PO TABS
50.0000 mg | ORAL_TABLET | Freq: Every day | ORAL | Status: DC
  Administered 2016-05-20 – 2016-05-23 (×4): 50 mg via ORAL
  Filled 2016-05-20 (×4): qty 2

## 2016-05-20 NOTE — H&P (Signed)
[facility]   PATIENT NAME: Kelli Matthews    MR#:  [id number]  DATE OF BIRTH:  [birth year]  DATE OF ADMISSION:  05/20/2016  PRIMARY CARE PHYSICIAN: No PCP Per Patient   REQUESTING/REFERRING PHYSICIAN: Dr. Dorothea Glassman  CHIEF COMPLAINT:   Chief Complaint  Patient presents with  . Altered Mental Status    HISTORY OF PRESENT ILLNESS:  Kelli Matthews  is a 75 y.o. female with a known history of [name], chronic kidney disease, hypertension, history of nephrolithiasis, who presents to the hospital from a skilled nursing facility to altered mental status. Patient has underlying advanced dementia and therefore most of the history obtained from the son at bedside. As per the son the skilled nursing facility noted that the patient was not eating and drinking well for the past couple days. Her urinalysis was positive for UTI and treated the patient with oral Macrobid but she was not improving. Today she was more lethargic and altered therefore sent to the ER for further evaluation. In the emergency room patient was noted to be in acute kidney injury with acute rhabdomyolysis and also noted to be severely septic and therefore hospice services were contacted further treatment and evaluation.  PAST MEDICAL HISTORY:   Past Medical History:  Diagnosis Date  . Alzheimer disease   . Alzheimer disease   . Anemia   . Arthritis 2005  . Chronic kidney disease   . Dementia 2012  . H/O cystitis 2010  . Hepatic cyst   . Hypertension 2001   on medicaitons for at least 10 years  . Hypokalemia   . Kidney stone   . Maxillary sinus fracture (HCC)    after fall 2013, with LOC  . Other esophagitis 2011  . Personal history of tobacco use, presenting hazards to health 2011  . Rectal mass 2010    PAST SURGICAL HISTORY:   Past Surgical History:  Procedure Laterality Date  . BREAST LUMPECTOMY  1985   Benign per pt, right breast  . BREAST REDUCTION  SURGERY  2003  . BREAST REDUCTION SURGERY  2003or 2004   by Dr. Genevieve Norlander  . COLON SURGERY  2010   polyps, Dr. Lemar Livings, hemicolectomy  . COLONOSCOPY  2004, 2010,2011  . CYSTOSCOPY W/ URETERAL STENT REMOVAL Left 02/18/2015   Procedure: CYSTOSCOPY WITH STENT REMOVAL;  Surgeon: Vanna Scotland, MD;  Location: [facility] OR[facility];  Service: Urology;  Laterality: Left;  . CYSTOSCOPY/URETEROSCOPY/HOLMIUM LASER/STENT PLACEMENT Left 01/04/2015   Procedure: CYSTOSCOPY/LEFTURETEROSCOPY/HOLMIUM LASER/LEFT STENT PLACEMENT/LEFT RETROGRADE;  Surgeon: Vanna Scotland, MD;  Location: [facility] OR[facility];  Service: Urology;  Laterality: Left;  . UPPER GI ENDOSCOPY  2011  . varicose veins  2011   laser surgery  . VEIN LIGATION AND STRIPPING  2005  . WRIST SURGERY  2005    SOCIAL HISTORY:   Social History  Substance Use Topics  . Smoking status: Former Smoker    Years: 17.00    Types: Cigarettes    Quit date: 08/28/1995  . Smokeless tobacco: Never Used     Comment: quit in 1977  . Alcohol use No    FAMILY HISTORY:   Family History  Problem Relation Age of Onset  . Ovarian cancer Sister   . Early death Sister 32  . Alzheimer's disease Mother   . Stroke Mother   . Depression Son     DRUG ALLERGIES:   Allergies  Allergen Reactions  . Amoxicillin-Pot Clavulanate Nausea And Vomiting  . Augmentin [Amoxicillin-Pot Clavulanate]  Rash  . Latex Rash    REVIEW OF SYSTEMS:   Review of Systems  Unable to perform ROS: Dementia    MEDICATIONS AT HOME:   Prior to Admission medications   Medication Sig Start Date End Date Taking? Authorizing Provider  acetaminophen (TYLENOL) 325 MG tablet Take 650 mg by mouth every 4 (four) hours as needed for mild pain or fever.   Yes Historical Provider, MD  aspirin (ASPIRIN CHILDRENS) 81 MG chewable tablet Chew 1 tablet (81 mg total) by mouth daily. 03/24/15  Yes Shelia MediaJennifer A Walker, MD  cholecalciferol (VITAMIN D) 1000 UNITS tablet TAKE ONE TABLET BY MOUTH EACH DAY. 05/13/14  Yes Shelia MediaJennifer A  Walker, MD  citalopram (CELEXA) 20 MG tablet Take 1 tablet (20 mg total) by mouth daily. 02/04/15  Yes Shaune PollackQing Chen, MD  desonide (DESOWEN) 0.05 % cream Apply 1 application topically as needed (to face).  08/01/12  Yes Historical Provider, MD  donepezil (ARICEPT) 10 MG tablet TAKE ONE TABLET BY MOUTH EACH DAY AT BEDTIME. 07/06/13  Yes Shelia MediaJennifer A Walker, MD  fluticasone (FLONASE) 50 MCG/ACT nasal spray Two sprays in each nostril daily. 09/09/12  Yes Raquel Conni ElliotM Rey, NP  ibuprofen (ADVIL,MOTRIN) 400 MG tablet Take 400 mg by mouth 3 (three) times daily.   Yes Historical Provider, MD  Lactobacillus Rhamnosus, GG, (CULTURELLE) CAPS Take 1 capsule by mouth daily. 05/19/16 05/28/16 Yes Historical Provider, MD  Loperamide HCl (IMODIUM PO) Take 2 mg by mouth daily.    Yes Historical Provider, MD  memantine (NAMENDA) 10 MG tablet Take 10 mg by mouth 2 (two) times daily.   Yes Historical Provider, MD  metoprolol succinate (TOPROL-XL) 25 MG 24 hr tablet Take 1 tablet (25 mg total) by mouth daily. 03/25/12  Yes Shelia MediaJennifer A Walker, MD  mirtazapine (REMERON) 15 MG tablet Take 30 mg by mouth at bedtime.  12/10/12  Yes Historical Provider, MD  Multiple Vitamins-Minerals (THERAVIM-M) TABS TAKE ONE TABLET BY MOUTH EACH DAY. 05/28/13  Yes Shelia MediaJennifer A Walker, MD  nitrofurantoin, macrocrystal-monohydrate, (MACROBID) 100 MG capsule Take 100 mg by mouth 2 (two) times daily. 05/19/16 05/25/16 Yes Historical Provider, MD  QUEtiapine (SEROQUEL) 50 MG tablet Take 50 mg by mouth at bedtime.   Yes Historical Provider, MD  Skin Protectants, Misc. (EUCERIN) cream Apply 1 application topically daily.   Yes Historical Provider, MD  gentamicin cream (GARAMYCIN) 0.1 % APPLY TWICE DAILY TO SKIN LESIONS ON UPPER BACK    Shelia MediaJennifer A Walker, MD      VITAL SIGNS:  Blood pressure (!) 137/58, pulse (!) 111, temperature (!) 96.8 F (36 C), temperature source Axillary, resp. rate 20, height 5\' 6"  (1.676 m), weight 63.5 kg (140 lb), SpO2 96 %.  PHYSICAL  EXAMINATION:  Physical Exam  GENERAL:  75 y.o.-year-old patient lying in the bed non-verbal in NAD.  EYES: Pupils equal, round, reactive to light and accommodation. No scleral icterus. Extraocular muscles intact.  HEENT: Head atraumatic, normocephalic. Oropharynx and nasopharynx clear. No oropharyngeal erythema, Dry oral mucosa  NECK:  Supple, no jugular venous distention. No thyroid enlargement, no tenderness.  LUNGS: Normal breath sounds bilaterally, no wheezing, rales, rhonchi. No use of accessory muscles of respiration.  CARDIOVASCULAR: S1, S2, Tachy. No murmurs, rubs, gallops, clicks.  ABDOMEN: Soft, nontender, nondistended. Bowel sounds present. No organomegaly or mass.  EXTREMITIES: No pedal edema, cyanosis, or clubbing. + 2 pedal & radial pulses b/l.   NEUROLOGIC: Cranial nerves II through XII are intact. No focal Motor or sensory deficits appreciated  b/l. Globally weak.   PSYCHIATRIC: The patient is alert and oriented x 1. Flat affect.  SKIN: No obvious rash, lesion, or ulcer.   LABORATORY PANEL:   CBC  Recent Labs Lab 05/20/16 1111  WBC 22.3*  HGB 12.4  HCT 38.0  PLT 171   ------------------------------------------------------------------------------------------------------------------  Chemistries   Recent Labs Lab 05/20/16 1111  NA 146*  K 3.8  CL 107  CO2 14*  GLUCOSE 150*  BUN 99*  CREATININE 5.84*  CALCIUM 7.1*  AST 1,067*  ALT 762*  ALKPHOS 173*  BILITOT 0.9   ------------------------------------------------------------------------------------------------------------------  Cardiac Enzymes  Recent Labs Lab 05/20/16 1111  TROPONINI 0.36*   ------------------------------------------------------------------------------------------------------------------  RADIOLOGY:  Ct Head Wo Contrast  Result Date: 05/20/2016 CLINICAL DATA:  Worsening mental status.  Nursing home patient. EXAM: CT HEAD WITHOUT CONTRAST TECHNIQUE: Contiguous axial images were  obtained from the base of the skull through the vertex without intravenous contrast. COMPARISON:  10/17/2015 FINDINGS: Brain: Cerebral atrophy. Mild low density in the periventricular white matter likely related to small vessel disease. Ventriculomegaly is similar over multiple prior exams and favored to be related to atrophy. No infarct, mass lesion, intra-axial, or extra-axial fluid collection. No hemorrhage. Vascular: No hyperdense vessel or unexpected calcification. Skull: No significant soft tissue swelling.  No skull fracture. Sinuses/Orbits: Normal imaged orbits and globes. Hypoplastic right frontal sinus. Other paranasal sinuses and mastoid air cells clear. Cerumen in the external ear canals. Other: None IMPRESSION: 1.  No acute intracranial abnormality. 2.  Cerebral atrophy and small vessel ischemic change. 3. Chronic ventriculomegaly which is favored to be related to cerebral atrophy. Electronically Signed   By: Jeronimo Greaves M.D.   On: 05/20/2016 11:28   US Abdomen Complete  Result Date: 05/20/2016 CLINICAL DATA:  Sepsis. Elevated liver function tests. Flank bruising. EXAM: ABDOMEN ULTRASOUND COMPLETE COMPARISON:  01/22/2015 and abdomen and pelvis CT dated 11/25/2014. FINDINGS: Gallbladder: No gallstones or wall thickening visualized. No sonographic Murphy sign noted by sonographer. Common bile duct: Diameter: 9.7 mm proximally. Previously 9 mm. No visible stones. Liver: Multiple cysts. IVC: No abnormality visualized. Pancreas: Visualized portion unremarkable. Spleen: Size and appearance within normal limits. Right Kidney: Length: 10.3 cm. Normal echogenicity. Interval 1.9 cm calculus in the midportion of the kidney. This appears to be within the previously demonstrated prominent extrarenal pelvis. No calyceal dilatation. Left Kidney: Length: 11.1 cm. 1.0 cm upper pole cyst. Normal echogenicity. No hydronephrosis. The previously demonstrated lower pole calculus is not currently visualized. Abdominal  aorta: No aneurysm visualized. Other findings: None. IMPRESSION: 1. Interval 1.9 cm calculus in the previously demonstrated a prominent extrarenal pelvis on the right. 2. The previously seen lower pole left renal calculus is no longer demonstrated. 3. Previously demonstrated liver cysts and mildly dilated common duct. Electronically Signed   By: Beckie Salts M.D.   On: 05/20/2016 14:43   Dg Chest Portable 1 View  Result Date: 05/20/2016 CLINICAL DATA:  Sepsis EXAM: PORTABLE CHEST 1 VIEW COMPARISON:  Jan 22, 2015 FINDINGS: There is slight bibasilar lung atelectatic change. The lungs elsewhere are clear. Heart size and pulmonary vascularity are normal. No adenopathy. There is atherosclerotic calcification in the aortic arch region. IMPRESSION: Slight bibasilar atelectasis. Lungs elsewhere clear. There is aortic atherosclerosis. Electronically Signed   By: Bretta Bang III M.D.   On: 05/20/2016 15:10     IMPRESSION AND PLAN:   75 year old female with past medical history of advanced dementia, hypertension, history of nephrolithiasis, chronic kidney disease, chronic anemia who presents to  the hospital due to altered mental status.   1. Altered mental status-this is metabolic encephalopathy secondary to the sepsis/UTI and acute rhabdomyolysis. -CT head is negative for any acute pathology. -treat the underlying sepsis with IV fluids, IV antibiotics. We'll follow clinically.  2. Sepsis-patient meets criteria given her elevated lactic acid, tachycardia and abnormal urinalysis. -source is a UTI. I will treat the patient with IV antibiotics, with meropenem, follow hemodynamics. -Follow blood, urine cultures.  3. Urinary tract infection-this is of patient's sepsis. Place on IV meropenem, follow urine cultures.  4. Acute kidney injury-secondary to sepsis and severe dehydration. -We'll aggressively hydrate with IV fluids, follow BUN and creatinine and urine output. I will get a nephrology  consult. -Abdominal ultrasound showing no evidence of hydronephrosis  5. Acute rhabdomyolysis-secondary to dehydration and sepsis. -Aggressively hydrated with IV fluids, follow serial CKs.  6. Abnormal LFTs-I suspect this is acute hepatitis secondary to underlying infection patient is hemodynamically stable and therefore IS shock liver. -Follow serial LFTs.  7. Elevated troponin-supply demand ischemia from the underlying sepsis. -Observe on telemetry, cycle her cardiac markers.  8. Dementia-continue Aricept, Namenda.   All the records are reviewed and case discussed with ED provider. Management plans discussed with the patient, family and they are in agreement.  CODE STATUS: DO NOT RESUSCITATE  TOTAL TIME TAKING CARE OF THIS PATIENT: 50 minutes.    Houston Siren M.D on 05/20/2016 at 3:18 PM  Between 7am to 6pm - Pager - [phone]  After 6pm go to [email or web address] - password [email or web address] [facility]  [facility]  Office  [phone]  CC: Primary care physician; No PCP Per Patient

## 2016-05-20 NOTE — ED Notes (Signed)
Pt with CT. 

## 2016-05-20 NOTE — ED Notes (Signed)
Per son, pt had fever Friday and urinalysis showed UTI in which she was given bactrim. Switched her to macrobid this week.

## 2016-05-20 NOTE — Progress Notes (Signed)
Pharmacy Antibiotic Note  Kelli Matthews is a 75 y.o. female admitted on 05/20/2016 with sepsis/ UTI.  Pharmacy has been consulted for meropenem dosing.  Plan: Pt received ceftriaxone and levofloxacin x1 in the ED. Will now be changed to meropenem 1g IV Q24h per patient renal function of 7.8 ml/min. Continue to monitor renal function. Follow-up on cultures.   Height: 5\' 6"  (167.6 cm) Weight: 140 lb (63.5 kg) IBW/kg (Calculated) : 59.3  Temp (24hrs), Avg:96.8 F (36 C), Min:96.8 F (36 C), Max:96.8 F (36 C)   Recent Labs Lab 05/20/16 1111 05/20/16 1411  WBC 22.3*  --   CREATININE 5.84*  --   LATICACIDVEN 8.2* 4.1*    Estimated Creatinine Clearance: 7.8 mL/min (by C-G formula based on SCr of 5.84 mg/dL (H)).    Allergies  Allergen Reactions  . Amoxicillin-Pot Clavulanate Nausea And Vomiting  . Augmentin [Amoxicillin-Pot Clavulanate] Rash  . Latex Rash    Antimicrobials this admission: Ceftriaxone 9/24 >> 9/24 Levofloxacin 9/24 >> 9/24 Meropenem 9/24 >>  Dose adjustments this admission: Meropenem 1g Q24h  Microbiology results: 9/24 BCx: in process 9/24 UCx: in process 9/24 MRSA PCR: in process  Thank you for allowing pharmacy to be a part of this patient's care.  Delsa BernKelly m Fuhrmann, PharmD 05/20/2016 4:52 PM

## 2016-05-20 NOTE — ED Notes (Signed)
Per admitting MD, told to wait/hold on foley

## 2016-05-20 NOTE — ED Notes (Signed)
Spoke with Kelli Matthews , daughter, [place] [phone]

## 2016-05-20 NOTE — ED Provider Notes (Signed)
[facility] Emergency Department Provider Note   ____________________________________________   First MD Initiated Contact with Patient 05/20/16 1109     (approximate)  I have reviewed the triage vital signs and the nursing notes.   HISTORY  Chief Complaint Altered Mental Status  History limited by severe dementia  HPI Kelli Matthews is a 75 y.o. female history obtained from son. He reports patient was last at her baseline mental status without Thursday. She was then diagnosed with UTI put on Bactrim but she vomited this was changed to [facility] today she was noticed to not be tapping she usually She Feels Good and She Is Not Tapping so They Brought Her in..   Past Medical History:  Diagnosis Date  . Alzheimer disease   . Alzheimer disease   . Anemia   . Arthritis 2005  . Chronic kidney disease   . Dementia 2012  . H/O cystitis 2010  . Hepatic cyst   . Hypertension 2001   on medicaitons for at least 10 years  . Hypokalemia   . Kidney stone   . Maxillary sinus fracture (HCC)    after fall 2013, with LOC  . Other esophagitis 2011  . Personal history of tobacco use, presenting hazards to health 2011  . Rectal mass 2010    Patient Active Problem List   Diagnosis Date Noted  . Rash and nonspecific skin eruption 03/17/2015  . Retained ureteral stent 02/17/2015  . Hypokalemia 01/22/2015  . Elevated LFTs 01/22/2015  . Ankle fracture 01/21/2015  . HLD (hyperlipidemia) 01/21/2015  . Anemia 12/31/2013  . Chronic diarrhea 11/05/2013  . Personal history of colonic polyps 06/04/2013  . Sleep disturbance 01/26/2013  . Hypertension 09/27/2011  . Dementia with behavioral disturbance 09/27/2011    Past Surgical History:  Procedure Laterality Date  . BREAST LUMPECTOMY  1985   Benign per pt, right breast  . BREAST REDUCTION SURGERY  2003  . BREAST REDUCTION SURGERY  2003or 2004   by Dr. Genevieve Norlander  . COLON SURGERY  2010   polyps, Dr. Lemar Livings,  hemicolectomy  . COLONOSCOPY  2004, 2010,2011  . CYSTOSCOPY W/ URETERAL STENT REMOVAL Left 02/18/2015   Procedure: CYSTOSCOPY WITH STENT REMOVAL;  Surgeon: Vanna Scotland, MD;  Location: [facility] ORS;  Service: Urology;  Laterality: Left;  . CYSTOSCOPY/URETEROSCOPY/HOLMIUM LASER/STENT PLACEMENT Left 01/04/2015   Procedure: CYSTOSCOPY/LEFTURETEROSCOPY/HOLMIUM LASER/LEFT STENT PLACEMENT/LEFT RETROGRADE;  Surgeon: Vanna Scotland, MD;  Location: [facility] ORS;  Service: Urology;  Laterality: Left;  . UPPER GI ENDOSCOPY  2011  . varicose veins  2011   laser surgery  . VEIN LIGATION AND STRIPPING  2005  . WRIST SURGERY  2005    Prior to Admission medications   Medication Sig Start Date End Date Taking? Authorizing Provider  acetaminophen (TYLENOL) 325 MG tablet Take 650 mg by mouth every 4 (four) hours as needed for mild pain or fever.   Yes Historical Provider, MD  aspirin (ASPIRIN CHILDRENS) 81 MG chewable tablet Chew 1 tablet (81 mg total) by mouth daily. 03/24/15  Yes Shelia Media, MD  cholecalciferol (VITAMIN D) 1000 UNITS tablet TAKE ONE TABLET BY MOUTH EACH DAY. 05/13/14  Yes Shelia Media, MD  citalopram (CELEXA) 20 MG tablet Take 1 tablet (20 mg total) by mouth daily. 02/04/15  Yes Shaune Pollack, MD  desonide (DESOWEN) 0.05 % cream Apply 1 application topically as needed (to face).  08/01/12  Yes Historical Provider, MD  donepezil (ARICEPT) 10 MG tablet TAKE ONE TABLET BY MOUTH  EACH DAY AT BEDTIME. 07/06/13  Yes Shelia MediaJennifer A Walker, MD  fluticasone [facility](FLONASE) 50 MCG/ACT nasal spray Two sprays in each nostril daily. 09/09/12  Yes Raquel Conni ElliotM Rey, NP  ibuprofen (ADVIL,MOTRIN) 400 MG tablet Take 400 mg by mouth 3 (three) times daily.   Yes Historical Provider, MD  Lactobacillus Rhamnosus, GG, (CULTURELLE) CAPS Take 1 capsule by mouth daily. 05/19/16 05/28/16 Yes Historical Provider, MD  Loperamide HCl (IMODIUM PO) Take 2 mg by mouth daily.    Yes Historical Provider, MD  memantine (NAMENDA) 10 MG tablet Take  10 mg by mouth 2 (two) times daily.   Yes Historical Provider, MD  metoprolol succinate (TOPROL-XL) 25 MG 24 hr tablet Take 1 tablet (25 mg total) by mouth daily. 03/25/12  Yes Shelia MediaJennifer A Walker, MD  mirtazapine (REMERON) 15 MG tablet Take 30 mg by mouth at bedtime.  12/10/12  Yes Historical Provider, MD  Multiple Vitamins-Minerals (THERAVIM-M) TABS TAKE ONE TABLET BY MOUTH EACH DAY. 05/28/13  Yes Shelia MediaJennifer A Walker, MD  nitrofurantoin, macrocrystal-monohydrate, (MACROBID) 100 MG capsule Take 100 mg by mouth 2 (two) times daily. 05/19/16 05/25/16 Yes Historical Provider, MD  QUEtiapine (SEROQUEL) 50 MG tablet Take 50 mg by mouth at bedtime.   Yes Historical Provider, MD  Skin Protectants, Misc. (EUCERIN) cream Apply 1 application topically daily.   Yes Historical Provider, MD  gentamicin cream (GARAMYCIN) 0.1 % APPLY TWICE DAILY TO SKIN LESIONS ON UPPER BACK    Shelia MediaJennifer A Walker, MD    Allergies Amoxicillin-pot clavulanate; Augmentin [amoxicillin-pot clavulanate]; and Latex  Family History  Problem Relation Age of Onset  . Ovarian cancer Sister   . Early death Sister [id number]  . Alzheimer's disease Mother   . Stroke Mother   . Depression Son     Social History Social History  Substance Use Topics  . Smoking status: Former Smoker    Years: 17.00    Types: Cigarettes    Quit date: 08/28/1995  . Smokeless tobacco: Never Used     Comment: quit in 1977  . Alcohol use No    Review of Systems Constitutional: No fever/chills Eyes: No visual changes. ENT: No sore throat. Cardiovascular: Denies chest pain. Respiratory: Denies shortness of breath. Gastrointestinal: No abdominal pain.  No nausea, no vomiting.  No diarrhea.  No constipation. Genitourinary: Negative for dysuria. Musculoskeletal: Negative for back pain. Skin: Negative for rash. Neurological: Negative for headaches, focal weakness or numbness.  10-point ROS otherwise  negative.  ____________________________________________   PHYSICAL EXAM:  VITAL SIGNS: ED Triage Vitals [05/20/16 1103]  Enc Vitals Group     BP 125/65     Pulse Rate (!) 104     Resp 18     Temp (!) 96.8 F (36 C)     Temp Source Axillary     SpO2      Weight      Height      Head Circumference      Peak Flow      Pain Score      Pain Loc      Pain Edu?      Excl. in GC?    Constitutional: ED but arousable. Eyes: Conjunctivae are normal. PERRL. EOMI. Head: Atraumatic. Nose: No congestion/rhinnorhea. Mouth/Throat: Mucous membranes are moist.  Oropharynx non-erythematous. Neck: No stridor. Cardiovascular: Normal rate, irregular rhythm. Grossly normal heart sounds.  Good peripheral circulation. Respiratory: Normal respiratory effort.  No retractions. Lungs CTAB. Gastrointestinal: Soft and nontender. No distention. No abdominal bruits. No CVA tenderness.  Rectal: Rhythm stool Musculoskeletal: No lower extremity tenderness nor edema.  No joint effusions. Patient has a bruise on the left flank just below the CVA area patient also has a bruise on the left posterior upper arm. Neurologic:  Normal speech and language. No gross focal neurologic deficits are appreciated. No gait instability. Skin:  Skin is warm, dry and intact. No rash noted. Psychiatric: Mood and affect are normal. Speech and behavior are normal.  ____________________________________________   LABS (all labs ordered are listed, but only abnormal results are displayed)  Labs Reviewed  LACTIC ACID, PLASMA - Abnormal; Notable for the following:       Result Value   Lactic Acid, Venous 8.2 (*)    All other components within normal limits  LACTIC ACID, PLASMA - Abnormal; Notable for the following:    Lactic Acid, Venous 4.1 (*)    All other components within normal limits  CBC WITH DIFFERENTIAL/PLATELET - Abnormal; Notable for the following:    WBC 22.3 (*)    RDW 17.0 (*)    Neutro Abs 18.0 (*)     Monocytes Absolute 2.7 (*)    All other components within normal limits  URINALYSIS COMPLETEWITH MICROSCOPIC ([facility] ONLY) - Abnormal; Notable for the following:    Color, Urine AMBER (*)    APPearance CLOUDY (*)    Hgb urine dipstick 3+ (*)    Protein, ur 100 (*)    Leukocytes, UA 3+ (*)    Bacteria, UA MANY (*)    Squamous Epithelial / LPF 0-5 (*)    All other components within normal limits  COMPREHENSIVE METABOLIC PANEL - Abnormal; Notable for the following:    Sodium 146 (*)    CO2 14 (*)    Glucose, Bld 150 (*)    BUN 99 (*)    Creatinine, Ser 5.84 (*)    Calcium 7.1 (*)    Albumin 3.2 (*)    AST 1,067 (*)    ALT 762 (*)    Alkaline Phosphatase 173 (*)    GFR calc non Af Amer 6 (*)    GFR calc Af Amer 7 (*)    Anion gap 25 (*)    All other components within normal limits  TROPONIN I - Abnormal; Notable for the following:    Troponin I 0.36 (*)    All other components within normal limits  PROTIME-INR - Abnormal; Notable for the following:    Prothrombin Time 16.0 (*)    All other components within normal limits  CK - Abnormal; Notable for the following:    Total CK 6,027 (*)    All other components within normal limits  BLOOD GAS, ARTERIAL - Abnormal; Notable for the following:    pH, Arterial 7.30 (*)    pCO2 arterial 29 (*)    pO2, Arterial 65 (*)    Bicarbonate 14.3 (*)    Acid-base deficit 10.9 (*)    Allens test (pass/fail) POSITIVE (*)    All other components within normal limits  URINE CULTURE  CULTURE, BLOOD (ROUTINE X 2)  CULTURE, BLOOD (ROUTINE X 2)  APTT  I-STAT CG4 LACTIC ACID, ED  I-STAT CG4 LACTIC ACID, ED  TYPE AND SCREEN   ____________________________________________  EKG  EKG read and interpreted by me shows sinus tachycardia rate of 111 there are occasional PVCs. There are also occasional PACs. ____________________________________________  RADIOLOGY   ____________________________________________   PROCEDURES  Procedure(s)  performed:   Procedures  Critical Care performed:  ______ critical care  1-1/2 hours. This includes talking to the son several times in talking to the daughter on the phone. Also reviewing the studies and talking to the hospitalist several times. ______________________________________   INITIAL IMPRESSION / ASSESSMENT AND PLAN / ED COURSE  Pertinent labs & imaging results that were available during my care of the patient were reviewed by me and considered in my medical decision making (see chart for details).    Clinical Course     ____________________________________________   FINAL CLINICAL IMPRESSION(S) / ED DIAGNOSES  Final diagnoses:  Altered mental status, unspecified altered mental status type  Sepsis, due to unspecified organism [facility])  Urinary tract infection with hematuria, site unspecified  Melena  Non-traumatic rhabdomyolysis      NEW MEDICATIONS STARTED DURING THIS VISIT:  New Prescriptions   No medications on file     Note:  This document was prepared using Dragon voice recognition software and may include unintentional dictation errors.    Arnaldo Natal, MD 05/20/16 [phone]

## 2016-05-20 NOTE — ED Triage Notes (Signed)
[facility]. Pt typically taps when she is feeling good and has not been tapping since.

## 2016-05-20 NOTE — ED Notes (Signed)
Pt with ultrasound 

## 2016-05-21 LAB — COMPREHENSIVE METABOLIC PANEL
ALT: 342 U/L — ABNORMAL HIGH (ref 14–54)
ANION GAP: 10 (ref 5–15)
AST: 358 U/L — ABNORMAL HIGH (ref 15–41)
Albumin: 2.1 g/dL — ABNORMAL LOW (ref 3.5–5.0)
Alkaline Phosphatase: 101 U/L (ref 38–126)
BILIRUBIN TOTAL: 0.7 mg/dL (ref 0.3–1.2)
BUN: 101 mg/dL — ABNORMAL HIGH (ref 6–20)
CO2: 16 mmol/L — ABNORMAL LOW (ref 22–32)
Calcium: 6.1 mg/dL — CL (ref 8.9–10.3)
Chloride: 122 mmol/L — ABNORMAL HIGH (ref 101–111)
Creatinine, Ser: 5.2 mg/dL — ABNORMAL HIGH (ref 0.44–1.00)
GFR calc Af Amer: 8 mL/min — ABNORMAL LOW (ref >60)
GFR, EST NON AFRICAN AMERICAN: 7 mL/min — AB (ref >60)
Glucose, Bld: 114 mg/dL — ABNORMAL HIGH (ref 65–99)
POTASSIUM: 3.1 mmol/L — AB (ref 3.5–5.1)
Sodium: 148 mmol/L — ABNORMAL HIGH (ref 135–145)
TOTAL PROTEIN: 4.7 g/dL — AB (ref 6.5–8.1)

## 2016-05-21 LAB — CK: CK TOTAL: 3903 U/L — AB (ref 38–234)

## 2016-05-21 LAB — CBC
HEMATOCRIT: 29.7 % — AB (ref 35.0–47.0)
Hemoglobin: 9.7 g/dL — ABNORMAL LOW (ref 12.0–16.0)
MCH: 26.5 pg (ref 26.0–34.0)
MCHC: 32.7 g/dL (ref 32.0–36.0)
MCV: 80.9 fL (ref 80.0–100.0)
PLATELETS: 104 10*3/uL — AB (ref 150–440)
RBC: 3.68 MIL/uL — ABNORMAL LOW (ref 3.80–5.20)
RDW: 16.5 % — AB (ref 11.5–14.5)
WBC: 17.2 10*3/uL — ABNORMAL HIGH (ref 3.6–11.0)

## 2016-05-21 LAB — GLUCOSE, CAPILLARY
GLUCOSE-CAPILLARY: 99 mg/dL (ref 65–99)
GLUCOSE-CAPILLARY: 105 mg/dL — AB (ref 65–99)

## 2016-05-21 LAB — TROPONIN I: TROPONIN I: 0.21 ng/mL — AB (ref <0.03)

## 2016-05-21 MED ORDER — SODIUM BICARBONATE 8.4 % IV SOLN
INTRAVENOUS | Status: DC
  Administered 2016-05-21 – 2016-05-23 (×5): via INTRAVENOUS
  Filled 2016-05-21 (×12): qty 100

## 2016-05-21 MED ORDER — POTASSIUM CHLORIDE 10 MEQ/100ML IV SOLN
10.0000 meq | INTRAVENOUS | Status: AC
  Administered 2016-05-21 – 2016-05-22 (×5): 10 meq via INTRAVENOUS
  Filled 2016-05-21 (×5): qty 100

## 2016-05-21 NOTE — Progress Notes (Signed)
[facility] patient taken off monitor, CCMD notified, VSS, no issues this time, pt did did have a small amount of urine in her catheter upon transfer.  More that writer had seen until this point, foley had been almost empty each time checked for output.  Chart and IV meds sent with patient, transferred by NT.

## 2016-05-21 NOTE — Care Management (Signed)
Patient is admitted to icu for acute renal failure and rhabdomyolysis with severe sepsis. She is a [facility] [address]  Patient has [name] and usually interacts verbally with one word answers such as yes-no- or sometimes does not speak at all.  She is ambulatory and able to feed herself.  She is a DNR.  Consult present for CSW. Patient was not receiving any home health.  Per margaret, patient will have to be reassessed prior to return. Have requested PT consult from attending

## 2016-05-21 NOTE — Progress Notes (Signed)
Pharmacy Consult for Electrolyte Monitoring Indication: Hypokalemia  Allergies  Allergen Reactions  . Amoxicillin-Pot Clavulanate Nausea And Vomiting  . Augmentin [Amoxicillin-Pot Clavulanate] Rash  . Latex Rash    Patient Measurements: Height: 5\' 3"  (160 cm) Weight: 134 lb 7.7 oz (61 kg) IBW/kg (Calculated) : 52.4    Vital Signs: Temp: 98.6 F (37 C) (09/25 1200) Temp Source: Axillary (09/25 1200) BP: 101/52 (09/25 1300) Pulse Rate: 90 (09/25 1300) Intake/Output from previous day: 09/24 0701 - 09/25 0700 In: 5619.3 [I.V.:5369.3; IV Piggyback:250] Out: 79 [Urine:75; Stool:4] Intake/Output from this shift: Total I/O In: 589.6 [I.V.:589.6] Out: 0   Labs:  Recent Labs  05/20/16 1111 05/21/16 0251  WBC 22.3* 17.2*  HGB 12.4 9.7*  HCT 38.0 29.7*  PLT 171 104*  APTT 31  --   INR 1.27  --      Recent Labs  05/20/16 1111 05/21/16 0251  NA 146* 148*  K 3.8 3.1*  CL 107 122*  CO2 14* 16*  GLUCOSE 150* 114*  BUN 99* 101*  CREATININE 5.84* 5.20*  CALCIUM 7.1* 6.1*  PROT 6.9 4.7*  ALBUMIN 3.2* 2.1*  AST 1,067* 358*  ALT 762* 342*  ALKPHOS 173* 101  BILITOT 0.9 0.7   Estimated Creatinine Clearance: 7.7 mL/min (by C-G formula based on SCr of 5.2 mg/dL (H)).    Recent Labs  05/20/16 1632  GLUCAP 99    Medical History: Past Medical History:  Diagnosis Date  . Alzheimer disease   . Alzheimer disease   . Anemia   . Arthritis 2005  . Chronic kidney disease   . Dementia 2012  . H/O cystitis 2010  . Hepatic cyst   . Hypertension 2001   on medicaitons for at least 10 years  . Hypokalemia   . Kidney stone   . Maxillary sinus fracture (HCC)    after fall 2013, with LOC  . Other esophagitis 2011  . Personal history of tobacco use, presenting hazards to health 2011  . Rectal mass 2010    Medications:  Scheduled:  . donepezil  10 mg Oral QHS  . heparin  5,000 Units Subcutaneous Q8H  . memantine  10 mg Oral BID  . meropenem (MERREM) IV  1 g  Intravenous Q24H  . mirtazapine  30 mg Oral QHS  . potassium chloride  10 mEq Intravenous Q1 Hr x 5  . QUEtiapine  50 mg Oral QHS  . sodium chloride flush  3 mL Intravenous Q12H    Assessment:  Pharmacy consulted to assist in managing electrolytes in this 75 y/o F with  ARF due to sepsis and rhabdomyolysis.   Plan:  Will replace K with 5 runs IV. Will need to be slightly more aggressive due to bicarb drip. Will f/u AM labs.   Kelli Matthews, Kelli Matthews 05/21/2016,2:45 PM

## 2016-05-21 NOTE — Progress Notes (Signed)
[facility] -  at [facility]                                                                                                                                                                                            Patient Demographics   Kelli Matthews, is a 75 y.o. female, DOB - 08/12/1941, WUJ:[id number]  Admit date - 05/20/2016   Admitting Physician Houston Siren, MD  Outpatient Primary MD for the patient is No PCP Per Patient   LOS - 1  Subjective: Pt With advanced dementia and unable to provide any review of systems    Review of Systems:   CONSTITUTIONAL: Unable to provide due to dementia Vitals:   Vitals:   05/21/16 1000 05/21/16 1058 05/21/16 1100 05/21/16 1200  BP: (!) 143/83  (!) 135/98   Pulse: 91 91 99   Resp: (!) 21 (!) 21 19   Temp:    98.6 F (37 C)  TempSrc:    Axillary  SpO2: 98% 99% 97%   Weight:      Height:        Wt Readings from Last 3 Encounters:  05/20/16 134 lb 7.7 oz (61 kg)  10/17/15 122 lb 2.2 oz (55.4 kg)  08/25/15 126 lb 6 oz (57.3 kg)     Intake/Output Summary (Last 24 hours) at 05/21/16 1257 Last data filed at 05/21/16 1200  Gross per 24 hour  Intake          6208.86 ml  Output               79 ml  Net          6129.86 ml    Physical Exam:   GENERAL: Pleasant-appearing in no apparent distress.  HEAD, EYES, EARS, NOSE AND THROAT: Atraumatic, normocephalic. . Pupils equal and reactive to light. Sclerae anicteric. No conjunctival injection. No oro-pharyngeal erythema.  NECK: Supple. There is no jugular venous distention. No bruits, no lymphadenopathy, no thyromegaly.  HEART: Regular rate and rhythm,. No murmurs, no rubs, no clicks.  LUNGS: Clear to auscultation bilaterally. No rales or rhonchi. No wheezes.  ABDOMEN: Soft, flat, nontender, nondistended. Has good bowel sounds. No hepatosplenomegaly appreciated.  EXTREMITIES: No evidence of any cyanosis, clubbing, or peripheral edema.  +2 pedal and  radial pulses bilaterally.  NEUROLOGIC: The patient is alert, not oriented to place person or time with no focal motor or sensory deficits appreciated bilaterally.  SKIN: Moist and warm with no rashes appreciated.  Psych: Not anxious, depressed LN: No inguinal LN enlargement    Antibiotics   Anti-infectives    Start  Dose/Rate Route Frequency Ordered Stop   05/20/16 2200  meropenem (MERREM) 1 g in sodium chloride 0.9 % 100 mL IVPB     1 g 200 mL/hr over 30 Minutes Intravenous Every 24 hours 05/20/16 1651     05/20/16 1215  cefTRIAXone (ROCEPHIN) 2 g in dextrose 5 % 50 mL IVPB     2 g 100 mL/hr over 30 Minutes Intravenous  Once 05/20/16 1205 05/20/16 1509   05/20/16 1215  levofloxacin (LEVAQUIN) IVPB 500 mg     500 mg 100 mL/hr over 60 Minutes Intravenous  Once 05/20/16 1206 05/20/16 1408      Medications   Scheduled Meds: . donepezil  10 mg Oral QHS  . heparin  5,000 Units Subcutaneous Q8H  . memantine  10 mg Oral BID  . meropenem (MERREM) IV  1 g Intravenous Q24H  . mirtazapine  30 mg Oral QHS  . QUEtiapine  50 mg Oral QHS  . sodium chloride flush  3 mL Intravenous Q12H   Continuous Infusions: .  sodium bicarbonate  infusion 1000 mL 125 mL/hr at 05/21/16 1200   PRN Meds:.acetaminophen **OR** acetaminophen, ondansetron **OR** ondansetron (ZOFRAN) IV   Data Review:   Micro Results Recent Results (from the past 240 hour(s))  MRSA PCR Screening     Status: None   Collection Time: 05/20/16  4:39 PM  Result Value Ref Range Status   MRSA by PCR NEGATIVE NEGATIVE Final    Comment:        The GeneXpert MRSA Assay (FDA approved for NASAL specimens only), is one component of a comprehensive MRSA colonization surveillance program. It is not intended to diagnose MRSA infection nor to guide or monitor treatment for MRSA infections.     Radiology Reports Ct Head Wo Contrast  Result Date: 05/20/2016 CLINICAL DATA:  Worsening mental status.  Nursing home patient.  EXAM: CT HEAD WITHOUT CONTRAST TECHNIQUE: Contiguous axial images were obtained from the base of the skull through the vertex without intravenous contrast. COMPARISON:  10/17/2015 FINDINGS: Brain: Cerebral atrophy. Mild low density in the periventricular white matter likely related to small vessel disease. Ventriculomegaly is similar over multiple prior exams and favored to be related to atrophy. No infarct, mass lesion, intra-axial, or extra-axial fluid collection. No hemorrhage. Vascular: No hyperdense vessel or unexpected calcification. Skull: No significant soft tissue swelling.  No skull fracture. Sinuses/Orbits: Normal imaged orbits and globes. Hypoplastic right frontal sinus. Other paranasal sinuses and mastoid air cells clear. Cerumen in the external ear canals. Other: None IMPRESSION: 1.  No acute intracranial abnormality. 2.  Cerebral atrophy and small vessel ischemic change. 3. Chronic ventriculomegaly which is favored to be related to cerebral atrophy. Electronically Signed   By: Jeronimo Greaves M.D.   On: 05/20/2016 11:28   US Abdomen Complete  Result Date: 05/20/2016 CLINICAL DATA:  Sepsis. Elevated liver function tests. Flank bruising. EXAM: ABDOMEN ULTRASOUND COMPLETE COMPARISON:  01/22/2015 and abdomen and pelvis CT dated 11/25/2014. FINDINGS: Gallbladder: No gallstones or wall thickening visualized. No sonographic Murphy sign noted by sonographer. Common bile duct: Diameter: 9.7 mm proximally. Previously 9 mm. No visible stones. Liver: Multiple cysts. IVC: No abnormality visualized. Pancreas: Visualized portion unremarkable. Spleen: Size and appearance within normal limits. Right Kidney: Length: 10.3 cm. Normal echogenicity. Interval 1.9 cm calculus in the midportion of the kidney. This appears to be within the previously demonstrated prominent extrarenal pelvis. No calyceal dilatation. Left Kidney: Length: 11.1 cm. 1.0 cm upper pole cyst. Normal echogenicity. No hydronephrosis. The previously  demonstrated lower pole calculus is not currently visualized. Abdominal aorta: No aneurysm visualized. Other findings: None. IMPRESSION: 1. Interval 1.9 cm calculus in the previously demonstrated a prominent extrarenal pelvis on the right. 2. The previously seen lower pole left renal calculus is no longer demonstrated. 3. Previously demonstrated liver cysts and mildly dilated common duct. Electronically Signed   By: Beckie Salts M.D.   On: 05/20/2016 14:43   Dg Chest Portable 1 View  Result Date: 05/20/2016 CLINICAL DATA:  Sepsis EXAM: PORTABLE CHEST 1 VIEW COMPARISON:  Jan 22, 2015 FINDINGS: There is slight bibasilar lung atelectatic change. The lungs elsewhere are clear. Heart size and pulmonary vascularity are normal. No adenopathy. There is atherosclerotic calcification in the aortic arch region. IMPRESSION: Slight bibasilar atelectasis. Lungs elsewhere clear. There is aortic atherosclerosis. Electronically Signed   By: Bretta Bang III M.D.   On: 05/20/2016 15:10     CBC  Recent Labs Lab 05/20/16 1111 05/21/16 0251  WBC 22.3* 17.2*  HGB 12.4 9.7*  HCT 38.0 29.7*  PLT 171 104*  MCV 82.3 80.9  MCH 26.9 26.5  MCHC 32.7 32.7  RDW 17.0* 16.5*  LYMPHSABS 1.6  --   MONOABS 2.7*  --   EOSABS 0.0  --   BASOSABS 0.0  --     Chemistries   Recent Labs Lab 05/20/16 1111 05/21/16 0251  NA 146* 148*  K 3.8 3.1*  CL 107 122*  CO2 14* 16*  GLUCOSE 150* 114*  BUN 99* 101*  CREATININE 5.84* 5.20*  CALCIUM 7.1* 6.1*  AST 1,067* 358*  ALT 762* 342*  ALKPHOS 173* 101  BILITOT 0.9 0.7   ------------------------------------------------------------------------------------------------------------------ estimated creatinine clearance is 7.7 mL/min (by C-G formula based on SCr of 5.2 mg/dL (H)). ------------------------------------------------------------------------------------------------------------------ No results for input(s): HGBA1C in the last 72  hours. ------------------------------------------------------------------------------------------------------------------ No results for input(s): CHOL, HDL, LDLCALC, TRIG, CHOLHDL, LDLDIRECT in the last 72 hours. ------------------------------------------------------------------------------------------------------------------ No results for input(s): TSH, T4TOTAL, T3FREE, THYROIDAB in the last 72 hours.  Invalid input(s): FREET3 ------------------------------------------------------------------------------------------------------------------ No results for input(s): VITAMINB12, FOLATE, FERRITIN, TIBC, IRON, RETICCTPCT in the last 72 hours.  Coagulation profile  Recent Labs Lab 05/20/16 1111  INR 1.27    No results for input(s): DDIMER in the last 72 hours.  Cardiac Enzymes  Recent Labs Lab 05/20/16 1917 05/20/16 2301 05/21/16 0251  TROPONINI 0.30* 0.25* 0.21*   ------------------------------------------------------------------------------------------------------------------ Invalid input(s): POCBNP    Assessment & Plan   75 year old female with past medical history of advanced dementia, hypertension, history of nephrolithiasis, chronic kidney disease, chronic anemia who presents to the hospital due to altered mental status.   1. Acute encephalopathy suspect due to sepsis due to UTI as well as acute kidney injury Baseline mental status unknown.  2. Sepsis-patient meets criteria given her elevated lactic acid, tachycardia and abnormal urinalysis. -source is a UTI. Continue IV meropenem   4. Acute kidney injury-secondary to sepsis and severe dehydration.  Appreciate nephrology input they have discussed with patient's daughter regarding dialysis they will think about that in light of her dementia  5. Acute rhabdomyolysis-secondary to dehydration and sepsis. Repeat CPK in the morning  6. Abnormal LFTs-suspect due to shock liver will repeat LFTs in the  morning  7. Elevated troponin-supply demand ischemia from the underlying sepsis. -Observe on telemetry, cycle her cardiac markers.  8. Dementia-continue Aricept, Namenda.        Code Status Orders        Start     Ordered   05/20/16  1638  Do not attempt resuscitation (DNR)  Continuous    Question Answer Comment  In the event of cardiac or respiratory ARREST Do not call a "code blue"   In the event of cardiac or respiratory ARREST Do not perform Intubation, CPR, defibrillation or ACLS   In the event of cardiac or respiratory ARREST Use medication by any route, position, wound care, and other measures to relive pain and suffering. May use oxygen, suction and manual treatment of airway obstruction as needed for comfort.      05/20/16 1637    Code Status History    Date Active Date Inactive Code Status Order ID Comments User Context   02/17/2015  2:11 PM 02/18/2015 10:31 PM Full Code [id number]  Vanna ScotlandAshley Brandon, MD Inpatient   01/23/2015  1:00 AM 02/04/2015  7:07 PM DNR [id number]  Crissie FiguresEdavally N Reddy, MD Inpatient    Advance Directive Documentation   Flowsheet Row Most Recent Value  Type of Advance Directive  Healthcare Power of Attorney, Living will  Pre-existing out of facility DNR order (yellow form or pink MOST form)  Yellow form placed in chart (order not valid for inpatient use)  "MOST" Form in Place?  No data           ConsultsNephrology   DVT Prophylaxis  SCDs  Lab Results  Component Value Date   PLT 104 (L) 05/21/2016     Time Spent in minutes   35min Greater than 50% of time spent in care coordination and counseling patient regarding the condition and plan of care.   Auburn BilberryPATEL, Judee Hennick M.D on 05/21/2016 at 12:57 PM  Between 7am to 6pm - Pager - [phone]  After 6pm go to [email or web address] - password [email or web address] [email or web address]  [facility]   Office  [phone]

## 2016-05-21 NOTE — Progress Notes (Signed)
Pharmacy Antibiotic Note  Kelli Matthews is a 75 y.o. female admitted on 05/20/2016 with sepsis/ UTI and subsequent AKI. Pharmacy has been consulted for meropenem dosing.  Plan: Pt received ceftriaxone and levofloxacin x1 in the ED. Now receiving meropenem 1g IV Q24h per patient renal function of 7.7 ml/min. Renal function not better today, continuing to follow. Still waiting for cultures.    Height: 5\' 3"  (160 cm) Weight: 134 lb 7.7 oz (61 kg) IBW/kg (Calculated) : 52.4  Temp (24hrs), Avg:98.1 F (36.7 C), Min:97.6 F (36.4 C), Max:98.7 F (37.1 C)   Recent Labs Lab 05/20/16 1111 05/20/16 1411 05/21/16 0251  WBC 22.3*  --  17.2*  CREATININE 5.84*  --  5.20*  LATICACIDVEN 8.2* 4.1*  --     Estimated Creatinine Clearance: 7.7 mL/min (by C-G formula based on SCr of 5.2 mg/dL (H)).    Allergies  Allergen Reactions  . Amoxicillin-Pot Clavulanate Nausea And Vomiting  . Augmentin [Amoxicillin-Pot Clavulanate] Rash  . Latex Rash    Antimicrobials this admission: Ceftriaxone 9/24 >> once Levofloxacin 9/24 >> once Meropenem 9/24 >>   Microbiology results: 9/24 BCx: Sent 9/24 UCx: Sent  9/24 MRSA PCR: negative  Thank you for allowing pharmacy to be a part of this patient's care.  Horris LatinoHolly Tremaine Earwood, PharmD Pharmacy Resident 05/21/2016 12:28 PM

## 2016-05-21 NOTE — Consult Note (Signed)
[facility] CONSULT NOTE    Date: 05/21/2016                  Patient Name:  Kelli Matthews  MRN: [id number]  DOB: Jan 18, 1941  Age / Sex: 75 y.o., female         PCP: No PCP Per Patient                 Service Requesting Consult: Dr. Auburn Bilberry                 Reason for Consult: Acute renal failure            History of Present Illness: Patient is a 75 y.o. female with a PMHx of severe also murmurs dementia, anemia, hypertension, nephrolithiasis with recent intervention, who was admitted to [facility] on 05/20/2016 for evaluation of altered mental status. We used to provide dialysis care to the patient's deceased husband. Patient has been at a memory care unit for her underlying [name]. She has recently been treated for nephrolithiasis by Dr. Apolinar Junes. Upon presentatiatient was fo, rhabdomyolysis, and sepsis.  Urinalysis showed 3+ leukocytes and too numerous to count RBCs. She is currently arousable but unable to provide any history. I had a long discussion with the patient's daughter regarding the potential for renal replacement therapy. After significant consideration the patient's daughter has decided against renal replacement therapy which is certainly reasonable.  Renal ultrasound was negative for obstruction and she is currently maintained on meropenems well as IV  Fluid hydration.   Medications: Outpatient medications: Prescriptions Prior to Admission  Medication Sig Dispense Refill Last Dose  . acetaminophen (TYLENOL) 325 MG tablet Take 650 mg by mouth every 4 (four) hours as needed for mild pain or fever.   prn at prn  . aspirin (ASPIRIN CHILDRENS) 81 MG chewable tablet Chew 1 tablet (81 mg total) by mouth daily. 30 tablet 6 05/20/2016 at 0800  . cholecalciferol (VITAMIN D) 1000 UNITS tablet TAKE ONE TABLET BY MOUTH EACH DAY. 30 tablet PRN 05/20/2016 at 0800  . citalopram (CELEXA) 20 MG tablet Take 1 tablet (20 mg total) by mouth daily. 30 tablet  0 05/20/2016 at 0800  . desonide (DESOWEN) 0.05 % cream Apply 1 application topically as needed (to face).    prn at prn  . donepezil (ARICEPT) 10 MG tablet TAKE ONE TABLET BY MOUTH EACH DAY AT BEDTIME. 30 tablet 11 05/19/2016 at 1900  . fluticasone (FLONASE) 50 MCG/ACT nasal spray Two sprays in each nostril daily. 16 g 6 05/20/2016 at 0800  . ibuprofen (ADVIL,MOTRIN) 400 MG tablet Take 400 mg by mouth 3 (three) times daily.   05/19/2016 at 2000  . Lactobacillus Rhamnosus, GG, (CULTURELLE) CAPS Take 1 capsule by mouth daily.   05/19/2016 at 0800  . Loperamide HCl (IMODIUM PO) Take 2 mg by mouth daily.    05/20/2016 at 0800  . memantine (NAMENDA) 10 MG tablet Take 10 mg by mouth 2 (two) times daily.   05/20/2016 at 0800  . metoprolol succinate (TOPROL-XL) 25 MG 24 hr tablet Take 1 tablet (25 mg total) by mouth daily. 30 tablet 6 05/20/2016 at 0800  . mirtazapine (REMERON) 15 MG tablet Take 30 mg by mouth at bedtime.    05/19/2016 at 1900  . Multiple Vitamins-Minerals (THERAVIM-M) TABS TAKE ONE TABLET BY MOUTH EACH DAY. 30 tablet 5 05/20/2016 at 0800  . nitrofurantoin, macrocrystal-monohydrate, (MACROBID) 100 MG capsule Take 100 mg by mouth 2 (two) times daily.  05/20/2016 at 0800  . QUEtiapine (SEROQUEL) 50 MG tablet Take 50 mg by mouth at bedtime.   05/19/2016 at 1900  . Skin Protectants, Misc. (EUCERIN) cream Apply 1 application topically daily.   prn at prn  . gentamicin cream (GARAMYCIN) 0.1 % APPLY TWICE DAILY TO SKIN LESIONS ON UPPER BACK 30 g 1 10/16/2015 at 2000    Current medications: Current Facility-Administered Medications  Medication Dose Route Frequency Provider Last Rate Last Dose  . 0.9 %  sodium chloride infusion   Intravenous Continuous Houston Siren, MD 125 mL/hr at 05/21/16 0700    . acetaminophen (TYLENOL) tablet 650 mg  650 mg Oral Q6H PRN Houston Siren, MD       Or  . acetaminophen (TYLENOL) suppository 650 mg  650 mg Rectal Q6H PRN Houston Siren, MD      . donepezil (ARICEPT)  tablet 10 mg  10 mg Oral QHS Houston Siren, MD   10 mg at 05/20/16 2132  . heparin injection 5,000 Units  5,000 Units Subcutaneous Q8H Houston Siren, MD   5,000 Units at 05/21/16 [phone]  . memantine (NAMENDA) tablet 10 mg  10 mg Oral BID Houston Siren, MD   10 mg at 05/20/16 2132  . meropenem (MERREM) 1 g in sodium chloride 0.9 % 100 mL IVPB  1 g Intravenous Q24H Delsa Bern, RPH   1 g at 05/20/16 2131  . mirtazapine (REMERON) tablet 30 mg  30 mg Oral QHS Houston Siren, MD   30 mg at 05/20/16 2132  . ondansetron (ZOFRAN) tablet 4 mg  4 mg Oral Q6H PRN Houston Siren, MD       Or  . ondansetron (ZOFRAN) injection 4 mg  4 mg Intravenous Q6H PRN Houston Siren, MD      . QUEtiapine (SEROQUEL) tablet 50 mg  50 mg Oral QHS Houston Siren, MD   50 mg at 05/20/16 2131  . sodium chloride flush (NS) 0.9 % injection 3 mL  3 mL Intravenous Q12H Houston Siren, MD   3 mL at 05/20/16 2131      Allergies: Allergies  Allergen Reactions  . Amoxicillin-Pot Clavulanate Nausea And Vomiting  . Augmentin [Amoxicillin-Pot Clavulanate] Rash  . Latex Rash      Past Medical History: Past Medical History:  Diagnosis Date  . Alzheimer disease   . Alzheimer disease   . Anemia   . Arthritis 2005  . Chronic kidney disease   . Dementia 2012  . H/O cystitis 2010  . Hepatic cyst   . Hypertension 2001   on medicaitons for at least 10 years  . Hypokalemia   . Kidney stone   . Maxillary sinus fracture (HCC)    after fall 2013, with LOC  . Other esophagitis 2011  . Personal history of tobacco use, presenting hazards to health 2011  . Rectal mass 2010     Past Surgical History: Past Surgical History:  Procedure Laterality Date  . BREAST LUMPECTOMY  1985   Benign per pt, right breast  . BREAST REDUCTION SURGERY  2003  . BREAST REDUCTION SURGERY  2003or 2004   by Dr. Genevieve Norlander  . COLON SURGERY  2010   polyps, Dr. Lemar Livings, hemicolectomy  . COLONOSCOPY  2004, 2010,2011  . CYSTOSCOPY W/ URETERAL  STENT REMOVAL Left 02/18/2015   Procedure: CYSTOSCOPY WITH STENT REMOVAL;  Surgeon: Vanna Scotland, MD;  Location: [facility] ORS;  Service: Urology;  Laterality: Left;  .  CYSTOSCOPY/URETEROSCOPY/HOLMIUM LASER/STENT PLACEMENT Left 01/04/2015   Procedure: CYSTOSCOPY/LEFTURETEROSCOPY/HOLMIUM LASER/LEFT STENT PLACEMENT/LEFT RETROGRADE;  Surgeon: Vanna Scotland, MD;  Location: [facility] ORS;  Service: Urology;  Laterality: Left;  . UPPER GI ENDOSCOPY  2011  . varicose veins  2011   laser surgery  . VEIN LIGATION AND STRIPPING  2005  . WRIST SURGERY  2005     Family History: Family History  Problem Relation Age of Onset  . Ovarian cancer Sister   . Early death Sister 57  . Alzheimer's disease Mother   . Stroke Mother   . Depression Son      Social History: Social History   Social History  . Marital status: Married    Spouse name: N/A  . Number of children: 2  . Years of education: N/A   Occupational History  . Retired - Corporate investment banker working at [employer] History Main Topics  . Smoking status: Former Smoker    Years: 17.00    Types: Cigarettes    Quit date: 08/28/1995  . Smokeless tobacco: Never Used     Comment: quit in 1977  . Alcohol use No  . Drug use: No  . Sexual activity: No   Other Topics Concern  . Not on file   Social History Narrative   Regular Exercise -  1 hour during work daily (at [facility])   Daily Caffeine Use:  2 - 4 daily      Review of Systems: As per HPI  Vital Signs: Blood pressure 134/75, pulse 96, temperature 98.7 F (37.1 C), temperature source Axillary, resp. rate (!) 26, height 5\' 3"  (1.6 m), weight 61 kg (134 lb 7.7 oz), SpO2 99 %.  Weight trends: Filed Weights   05/20/16 1359 05/20/16 1645  Weight: 63.5 kg (140 lb) 61 kg (134 lb 7.7 oz)    Physical Exam: General: Critically ill appearing  Head: Normocephalic, atraumatic.  Eyes: Anicteric, EOMI  Nose: Mucous membranes dry not inflammed, nonerythematous.  Throat: Oropharynx  nonerythematous, no exudate appreciated. OMs very dry  Neck: Supple, trachea midline.  Lungs:  Normal respiratory effort. Clear to auscultation BL without crackles or wheezes.  Heart: S1S2 no rubs  Abdomen:  Mild lower abdominal tenderness  Extremities: No pretibial edema.  Neurologic: A&O X3, Motor strength is 5/5 in the all 4 extremities  Skin: No visible rashes, scars.    Lab results: Basic Metabolic Panel:  Recent Labs Lab 05/20/16 1111 05/21/16 0251  NA 146* 148*  K 3.8 3.1*  CL 107 122*  CO2 14* 16*  GLUCOSE 150* 114*  BUN 99* 101*  CREATININE 5.84* 5.20*  CALCIUM 7.1* 6.1*    Liver Function Tests:  Recent Labs Lab 05/20/16 1111 05/21/16 0251  AST 1,067* 358*  ALT 762* 342*  ALKPHOS 173* 101  BILITOT 0.9 0.7  PROT 6.9 4.7*  ALBUMIN 3.2* 2.1*   No results for input(s): LIPASE, AMYLASE in the last 168 hours. No results for input(s): AMMONIA in the last 168 hours.  CBC:  Recent Labs Lab 05/20/16 1111 05/21/16 0251  WBC 22.3* 17.2*  NEUTROABS 18.0*  --   HGB 12.4 9.7*  HCT 38.0 29.7*  MCV 82.3 80.9  PLT 171 104*    Cardiac Enzymes:  Recent Labs Lab 05/20/16 1111 05/20/16 1528 05/20/16 1917 05/20/16 2301 05/21/16 0251  CKTOTAL 6,027*  --   --   --  3,903*  TROPONINI 0.36* 0.28* 0.30* 0.25* 0.21*    BNP: Invalid input(s): POCBNP  CBG: No  results for input(s): GLUCAP in the last 168 hours.  Microbiology: Results for orders placed or performed during the hospital encounter of 05/20/16  MRSA PCR Screening     Status: None   Collection Time: 05/20/16  4:39 PM  Result Value Ref Range Status   MRSA by PCR NEGATIVE NEGATIVE Final    Comment:        The GeneXpert MRSA Assay (FDA approved for NASAL specimens only), is one component of a comprehensive MRSA colonization surveillance program. It is not intended to diagnose MRSA infection nor to guide or monitor treatment for MRSA infections.     Coagulation Studies:  Recent Labs   05/20/16 1111  LABPROT 16.0*  INR 1.27    Urinalysis:  Recent Labs  05/20/16 1155  COLORURINE AMBER*  LABSPEC 1.020  PHURINE 5.0  GLUCOSEU NEGATIVE  HGBUR 3+*  BILIRUBINUR NEGATIVE  KETONESUR NEGATIVE  PROTEINUR 100*  NITRITE NEGATIVE  LEUKOCYTESUR 3+*      Imaging: Ct Head Wo Contrast  Result Date: 05/20/2016 CLINICAL DATA:  Worsening mental status.  Nursing home patient. EXAM: CT HEAD WITHOUT CONTRAST TECHNIQUE: Contiguous axial images were obtained from the base of the skull through the vertex without intravenous contrast. COMPARISON:  10/17/2015 FINDINGS: Brain: Cerebral atrophy. Mild low density in the periventricular white matter likely related to small vessel disease. Ventriculomegaly is similar over multiple prior exams and favored to be related to atrophy. No infarct, mass lesion, intra-axial, or extra-axial fluid collection. No hemorrhage. Vascular: No hyperdense vessel or unexpected calcification. Skull: No significant soft tissue swelling.  No skull fracture. Sinuses/Orbits: Normal imaged orbits and globes. Hypoplastic right frontal sinus. Other paranasal sinuses and mastoid air cells clear. Cerumen in the external ear canals. Other: None IMPRESSION: 1.  No acute intracranial abnormality. 2.  Cerebral atrophy and small vessel ischemic change. 3. Chronic ventriculomegaly which is favored to be related to cerebral atrophy. Electronically Signed   By: Jeronimo GreavesKyle  Talbot M.D.   On: 05/20/2016 11:28   [place] Abdomen Complete  Result Date: 05/20/2016 CLINICAL DATA:  Sepsis. Elevated liver function tests. Flank bruising. EXAM: ABDOMEN ULTRASOUND COMPLETE COMPARISON:  01/22/2015 and abdomen and pelvis CT dated 11/25/2014. FINDINGS: Gallbladder: No gallstones or wall thickening visualized. No sonographic Murphy sign noted by sonographer. Common bile duct: Diameter: 9.7 mm proximally. Previously 9 mm. No visible stones. Liver: Multiple cysts. IVC: No abnormality visualized. Pancreas:  Visualized portion unremarkable. Spleen: Size and appearance within normal limits. Right Kidney: Length: 10.3 cm. Normal echogenicity. Interval 1.9 cm calculus in the midportion of the kidney. This appears to be within the previously demonstrated prominent extrarenal pelvis. No calyceal dilatation. Left Kidney: Length: 11.1 cm. 1.0 cm upper pole cyst. Normal echogenicity. No hydronephrosis. The previously demonstrated lower pole calculus is not currently visualized. Abdominal aorta: No aneurysm visualized. Other findings: None. IMPRESSION: 1. Interval 1.9 cm calculus in the previously demonstrated a prominent extrarenal pelvis on the right. 2. The previously seen lower pole left renal calculus is no longer demonstrated. 3. Previously demonstrated liver cysts and mildly dilated common duct. Electronically Signed   By: Beckie SaltsSteven  Reid M.D.   On: 05/20/2016 14:43   Dg Chest Portable 1 View  Result Date: 05/20/2016 CLINICAL DATA:  Sepsis EXAM: PORTABLE CHEST 1 VIEW COMPARISON:  Jan 22, 2015 FINDINGS: There is slight bibasilar lung atelectatic change. The lungs elsewhere are clear. Heart size and pulmonary vascularity are normal. No adenopathy. There is atherosclerotic calcification in the aortic arch region. IMPRESSION: Slight bibasilar atelectasis. Lungs elsewhere clear.  There is aortic atherosclerosis. Electronically Signed   By: Bretta Bang III M.D.   On: 05/20/2016 15:10      Assessment & Plan: Pt is a 75 y.o. female with a PMHx of severe also murmurs dementia, anemia, hypertension, nephrolithiasis with recent intervention, who was admitted to [facility] on 05/20/2016 for evaluation of altered mental status. We used to provide dialysis care to the patient's deceased husband. Patient has been at a memory care unit for her underlying [name].   1. Acute renal failure secondary to sepsis/rhabdomyolysis.  2. Hypernatreamia. 3.  Hypokalemia. 4.  Nephrolithiasis. 5.  Metabolic acidosis. 6.   Rhabdomyolysis.  Plan: The patient presents with a very severe illness at the moment.  She certainly appears to have some element of volume depletion and hypernatremia. She has very little urine output as well. Because of her acute renal failure likely secondary to sepsis as well as rhabdomyolysis.Patient has history of  Nephrolithiasis and had recent intervention via urology. There is a stone in the right extrarenal pelvis however  there is no obstruction. We will proceed with IV fluid hydration but we will switch the patient to IV bicarbonate instead. I had a long discussion with the patient's daughter. She has decided against renal placement therapy at this time. Patient is high risk for further deterioration given sepsis, rhabdomyolysis, acute renal failure.Continue supportive For now.  Thanks for consultation.

## 2016-05-21 NOTE — Progress Notes (Signed)
Pt has been nonverbal, and mostly slept all night. Continue on IVF, and IV ABX. [facility]. No signs or symptoms of pain and/or discomfort. Bun/creatitine still elevated. Hgb down to 9.7. Had two stools last pm. Last BM this AM did not have any visible blood present.

## 2016-05-22 LAB — PHOSPHORUS: Phosphorus: 4.1 mg/dL (ref 2.5–4.6)

## 2016-05-22 LAB — BASIC METABOLIC PANEL
Anion gap: 10 (ref 5–15)
BUN: 107 mg/dL — AB (ref 6–20)
CHLORIDE: 119 mmol/L — AB (ref 101–111)
CO2: 20 mmol/L — AB (ref 22–32)
CREATININE: 5.98 mg/dL — AB (ref 0.44–1.00)
Calcium: 7.1 mg/dL — ABNORMAL LOW (ref 8.9–10.3)
GFR calc Af Amer: 7 mL/min — ABNORMAL LOW (ref >60)
GFR calc non Af Amer: 6 mL/min — ABNORMAL LOW (ref >60)
GLUCOSE: 109 mg/dL — AB (ref 65–99)
POTASSIUM: 3.6 mmol/L (ref 3.5–5.1)
SODIUM: 149 mmol/L — AB (ref 135–145)

## 2016-05-22 LAB — HEPATIC FUNCTION PANEL
ALK PHOS: 92 U/L (ref 38–126)
ALT: 215 U/L — AB (ref 14–54)
AST: 143 U/L — ABNORMAL HIGH (ref 15–41)
Albumin: 2.1 g/dL — ABNORMAL LOW (ref 3.5–5.0)
BILIRUBIN TOTAL: 0.6 mg/dL (ref 0.3–1.2)
Bilirubin, Direct: <0.1 mg/dL — ABNORMAL LOW (ref 0.1–0.5)
Total Protein: 5 g/dL — ABNORMAL LOW (ref 6.5–8.1)

## 2016-05-22 LAB — CBC
HEMATOCRIT: 30 % — AB (ref 35.0–47.0)
HEMOGLOBIN: 10 g/dL — AB (ref 12.0–16.0)
MCH: 26.8 pg (ref 26.0–34.0)
MCHC: 33.2 g/dL (ref 32.0–36.0)
MCV: 80.7 fL (ref 80.0–100.0)
Platelets: 116 10*3/uL — ABNORMAL LOW (ref 150–440)
RBC: 3.72 MIL/uL — ABNORMAL LOW (ref 3.80–5.20)
RDW: 16.5 % — ABNORMAL HIGH (ref 11.5–14.5)
WBC: 16.7 10*3/uL — ABNORMAL HIGH (ref 3.6–11.0)

## 2016-05-22 LAB — GLUCOSE, CAPILLARY: Glucose-Capillary: 106 mg/dL — ABNORMAL HIGH (ref 65–99)

## 2016-05-22 LAB — MAGNESIUM: Magnesium: 2.1 mg/dL (ref 1.7–2.4)

## 2016-05-22 MED ORDER — CITALOPRAM HYDROBROMIDE 20 MG PO TABS
10.0000 mg | ORAL_TABLET | Freq: Every day | ORAL | Status: DC
  Administered 2016-05-22 – 2016-05-24 (×3): 10 mg via ORAL
  Filled 2016-05-22 (×4): qty 1

## 2016-05-22 NOTE — Clinical Social Work Note (Signed)
Clinical Social Work Assessment  Patient Details  Name: Odessa FlemingRachel E Leppanen MRN: [id number] Date of Birth: 03/02/1941  Date of referral:  05/22/16               Reason for consult:  Facility Placement                Permission sought to share information with:   (patient with dementia and is in memory care) Permission granted to share information::     Name::        Agency::     Relationship::     Contact Information:     Housing/Transportation Living arrangements for the past 2 months:  Assisted Living Facility Source of Information:  Adult Children Patient Interpreter Needed:  None Criminal Activity/Legal Involvement Pertinent to Current Situation/Hospitalization:  No - Comment as needed Significant Relationships:  Adult Children Lives with:  Facility Resident Do you feel safe going back to the place where you live?  Yes Need for family participation in patient care:  No (Coment)  Care giving concerns:  [facility]   Social Worker assessment / plan:  CSW spoke with patient's son: Ihor GullyWilliam Perdue: [phone] this afternoon via phone. Patient has a daughter with whom the physician spoke with earlier today: Louisa SecondLaurie Perdue: [phone]. Mr. Vedia Pereyraerdue confirmed patient has been at [facility] for [facility] years and that she did have hospice following her for about a year and they discharged her on Labor Day of this year. He stated he could not remember the hospice agency. Mr. Vedia Pereyraerdue stated that he is currently out of town but that his sister is in town and that she can be contacted by staff as well. Patient's daughter requested Palliative Care and physician has ordered this. CSW will await for Palliative recommendations. CSW has left a message for CSX CorporationLinda Busick at [facility].   Employment status:  Retired Database administratornsurance information:  Managed Medicare PT Recommendations:  Not assessed at this time Information / Referral to community resources:      Patient/Family's Response to care:  Patient's son expressed appreciation for CSW assistance.  Patient/Family's Understanding of and Emotional Response to Diagnosis, Current Treatment, and Prognosis:  Patient's son is aware of patient's condition and expressed concern that once hospice discharged his mother, she declined rapidly. CSW provided supportive listening.  Emotional Assessment Appearance:    Attitude/Demeanor/Rapport:  Unable to Assess Affect (typically observed):  Unable to Assess Orientation:    Alcohol / Substance use:  Not Applicable Psych involvement (Current and /or in the community):  No (Comment)  Discharge Needs  Concerns to be addressed:  Care Coordination Readmission within the last 30 days:  No Current discharge risk:  None Barriers to Discharge:  No Barriers Identified   York SpanielMonica Ashlynne Shetterly, LCSW 05/22/2016, 2:05 PM

## 2016-05-22 NOTE — Progress Notes (Signed)
[facility]  ROUNDING NOTE   Subjective:  Renal function slightly worse today. BUN up to 107 with a creatinine of 5.98. Family all at bedside. Patient is awake and minimally verbal. Urine output of slightly over 1 L over the preceding 24 hours.   Objective:  Vital signs in last 24 hours:  Temp:  [97.4 F (36.3 C)-98.3 F (36.8 C)] 97.4 F (36.3 C) (09/26 1223) Pulse Rate:  [81-82] 82 (09/26 1223) Resp:  [16-24] 16 (09/26 1223) BP: (128-169)/(62-97) 128/65 (09/26 1223) SpO2:  [96 %-98 %] 97 % (09/26 1223)  Weight change:  Filed Weights   05/20/16 1359 05/20/16 1645  Weight: 63.5 kg (140 lb) 61 kg (134 lb 7.7 oz)    Intake/Output: I/O last 3 completed shifts: In: 3987.6 [I.V.:3315.6; IV Piggyback:672] Out: 1275 [Urine:1275]   Intake/Output this shift:  Total I/O In: 240 [P.O.:240] Out: 350 [Urine:350]  Physical Exam: General: No acute distress  Head: Normocephalic, atraumatic. Moist oral mucosal membranes  Eyes: Anicteric  Neck: Supple, trachea midline  Lungs:  Clear to auscultation, normal effort  Heart: S1S2 no rubs  Abdomen:  Soft, nontender, bowel sounds present  Extremities: no peripheral edema.  Neurologic: Awake, does say yes to a few questions  Skin: No lesions       Basic Metabolic Panel:  Recent Labs Lab 05/20/16 1111 05/21/16 0251 05/22/16 0434  NA 146* 148* 149*  K 3.8 3.1* 3.6  CL 107 122* 119*  CO2 14* 16* 20*  GLUCOSE 150* 114* 109*  BUN 99* 101* 107*  CREATININE 5.84* 5.20* 5.98*  CALCIUM 7.1* 6.1* 7.1*  MG  --   --  2.1  PHOS  --   --  4.1    Liver Function Tests:  Recent Labs Lab 05/20/16 1111 05/21/16 0251 05/22/16 0434  AST 1,067* 358* 143*  ALT 762* 342* 215*  ALKPHOS 173* 101 92  BILITOT 0.9 0.7 0.6  PROT 6.9 4.7* 5.0*  ALBUMIN 3.2* 2.1* 2.1*   No results for input(s): LIPASE, AMYLASE in the last 168 hours. No results for input(s): AMMONIA in the last 168 hours.  CBC:  Recent Labs Lab  05/20/16 1111 05/21/16 0251 05/22/16 0434  WBC 22.3* 17.2* 16.7*  NEUTROABS 18.0*  --   --   HGB 12.4 9.7* 10.0*  HCT 38.0 29.7* 30.0*  MCV 82.3 80.9 80.7  PLT 171 104* 116*    Cardiac Enzymes:  Recent Labs Lab 05/20/16 1111 05/20/16 1528 05/20/16 1917 05/20/16 2301 05/21/16 0251  CKTOTAL 6,027*  --   --   --  3,903*  TROPONINI 0.36* 0.28* 0.30* 0.25* 0.21*    BNP: Invalid input(s): POCBNP  CBG:  Recent Labs Lab 05/20/16 1632 05/21/16 2043 05/22/16 0744  GLUCAP 99 105* 106*    Microbiology: Results for orders placed or performed during the hospital encounter of 05/20/16  Culture, blood (routine x 2)     Status: None (Preliminary result)   Collection Time: 05/20/16 11:11 AM  Result Value Ref Range Status   Specimen Description BLOOD LEFT ARM  Final   Special Requests BOTTLES DRAWN AEROBIC AND ANAEROBIC  10CC  Final   Culture NO GROWTH 2 DAYS  Final   Report Status PENDING  Incomplete  Urine culture     Status: Abnormal (Preliminary result)   Collection Time: 05/20/16 11:55 AM  Result Value Ref Range Status   Specimen Description URINE, CATHETERIZED  Final   Special Requests Normal  Final   Culture (A)  Final    >=  100,000 COLONIES/mL PROTEUS MIRABILIS >=100,000 COLONIES/mL ESCHERICHIA COLI SUSCEPTIBILITIES TO FOLLOW Performed at [facility]    Report Status PENDING  Incomplete  Culture, blood (routine x 2)     Status: None (Preliminary result)   Collection Time: 05/20/16 12:15 PM  Result Value Ref Range Status   Specimen Description BLOOD RIGHT FOREARM  Final   Special Requests   Final    BOTTLES DRAWN AEROBIC AND ANAEROBIC  AER 3CC ANA 5CC   Culture NO GROWTH 2 DAYS  Final   Report Status PENDING  Incomplete  MRSA PCR Screening     Status: None   Collection Time: 05/20/16  4:39 PM  Result Value Ref Range Status   MRSA by PCR NEGATIVE NEGATIVE Final    Comment:        The GeneXpert MRSA Assay (FDA approved for NASAL specimens only), is  one component of a comprehensive MRSA colonization surveillance program. It is not intended to diagnose MRSA infection nor to guide or monitor treatment for MRSA infections.     Coagulation Studies:  Recent Labs  05/20/16 1111  LABPROT 16.0*  INR 1.27    Urinalysis:  Recent Labs  05/20/16 1155  COLORURINE AMBER*  LABSPEC 1.020  PHURINE 5.0  GLUCOSEU NEGATIVE  HGBUR 3+*  BILIRUBINUR NEGATIVE  KETONESUR NEGATIVE  PROTEINUR 100*  NITRITE NEGATIVE  LEUKOCYTESUR 3+*      Imaging: No results found.   Medications:   .  sodium bicarbonate  infusion 1000 mL 125 mL/hr at 05/22/16 0247   . citalopram  10 mg Oral Daily  . donepezil  10 mg Oral QHS  . heparin  5,000 Units Subcutaneous Q8H  . memantine  10 mg Oral BID  . meropenem (MERREM) IV  1 g Intravenous Q24H  . mirtazapine  30 mg Oral QHS  . QUEtiapine  50 mg Oral QHS  . sodium chloride flush  3 mL Intravenous Q12H   acetaminophen **OR** acetaminophen, ondansetron **OR** ondansetron (ZOFRAN) IV  Assessment/ Plan:  75 y.o. female with a PMHx of severe [name], anemia, hypertension, nephrolithiasis with recent intervention, who was [facility] 05/20/2016 for evaluation of altered mental status. We used to provide dialysis care to the patient's deceased husband. Patient has been at a memory care unit for her underlying [name].   1. Acute renal failure secondary to sepsis/rhabdomyolysis.  2. Hypernatreamia. 3.  Hypokalemia. 4.  Nephrolithiasis. 5.  Metabolic acidosis. 6.  Rhabdomyolysis.  Plan: Renal function has not significantly improved.  In fact BUN and creatinine are both slightly higher.  Urine output was 1 L over the preceding 24 hours.  The family has already decided against dialysis which certainly appears to be reasonable given severe underlying [name].  Palliative care consultation would be appropriate at this time.   LOS: 2 Devynn Scheff,  Mccauley Diehl 9/26/20173:04 PM

## 2016-05-22 NOTE — Progress Notes (Signed)
[facility] [place] at [facility]                                                                                                                                                                                            Patient Demographics   Kelli Matthews, is a 75 y.o. female, DOB - November 17, 1940, [id number]:[id number]  Admit date - 05/20/2016   Admitting Physician Houston Siren, MD  Outpatient Primary MD for the patient is No PCP Per Patient   LOS - 2  Subjective: Patient's daughter at bedside patient's symptoms to be back to her baseline dementia Renal function without much improvement liver function trending down   Review of Systems:   CONSTITUTIONAL: Unable to provide due to dementia Vitals:   Vitals:   05/21/16 1754 05/21/16 2046 05/22/16 0632 05/22/16 1223  BP: (!) 128/97 (!) 148/62 (!) 169/86 128/65  Pulse: 81 82 81 82  Resp: (!) 24 18 18 16   Temp: 98.3 F (36.8 C) 98 F (36.7 C) 98.3 F (36.8 C) 97.4 F (36.3 C)  TempSrc: Axillary Oral Oral   SpO2: 98% 98% 96% 97%  Weight:      Height:        Wt Readings from Last 3 Encounters:  05/20/16 134 lb 7.7 oz (61 kg)  10/17/15 122 lb 2.2 oz (55.4 kg)  08/25/15 126 lb 6 oz (57.3 kg)     Intake/Output Summary (Last 24 hours) at 05/22/16 1245 Last data filed at 05/22/16 1000  Gross per 24 hour  Intake             1798 ml  Output             1350 ml  Net              448 ml    Physical Exam:   GENERAL: Pleasant-appearing in no apparent distress.  HEAD, EYES, EARS, NOSE AND THROAT: Atraumatic, normocephalic. . Pupils equal and reactive to light. Sclerae anicteric. No conjunctival injection. No oro-pharyngeal erythema.  NECK: Supple. There is no jugular venous distention. No bruits, no lymphadenopathy, no thyromegaly.  HEART: Regular rate and rhythm,. No murmurs, no rubs, no clicks.  LUNGS: Clear to auscultation bilaterally. No rales or rhonchi. No wheezes.  ABDOMEN: Soft, flat, nontender,  nondistended. Has good bowel sounds. No hepatosplenomegaly appreciated.  EXTREMITIES: No evidence of any cyanosis, clubbing, or peripheral edema.  +2 pedal and radial pulses bilaterally.  NEUROLOGIC: The patient is alert, not oriented to place person or time with no focal motor or sensory deficits appreciated bilaterally.  SKIN: Moist and warm with no rashes  appreciated.  Psych: Not anxious, depressed LN: No inguinal LN enlargement    Antibiotics   Anti-infectives    Start     Dose/Rate Route Frequency Ordered Stop   05/20/16 2200  meropenem (MERREM) 1 g in sodium chloride 0.9 % 100 mL IVPB     1 g 200 mL/hr over 30 Minutes Intravenous Every 24 hours 05/20/16 1651     05/20/16 1215  cefTRIAXone (ROCEPHIN) 2 g in dextrose 5 % 50 mL IVPB     2 g 100 mL/hr over 30 Minutes Intravenous  Once 05/20/16 1205 05/20/16 1509   05/20/16 1215  levofloxacin (LEVAQUIN) IVPB 500 mg     500 mg 100 mL/hr over 60 Minutes Intravenous  Once 05/20/16 1206 05/20/16 1408      Medications   Scheduled Meds: . donepezil  10 mg Oral QHS  . heparin  5,000 Units Subcutaneous Q8H  . memantine  10 mg Oral BID  . meropenem (MERREM) IV  1 g Intravenous Q24H  . mirtazapine  30 mg Oral QHS  . QUEtiapine  50 mg Oral QHS  . sodium chloride flush  3 mL Intravenous Q12H   Continuous Infusions: .  sodium bicarbonate  infusion 1000 mL 125 mL/hr at 05/22/16 0247   PRN Meds:.acetaminophen **OR** acetaminophen, ondansetron **OR** ondansetron (ZOFRAN) IV   Data Review:   Micro Results Recent Results (from the past 240 hour(s))  Culture, blood (routine x 2)     Status: None (Preliminary result)   Collection Time: 05/20/16 11:11 AM  Result Value Ref Range Status   Specimen Description BLOOD LEFT ARM  Final   Special Requests BOTTLES DRAWN AEROBIC AND ANAEROBIC  10CC  Final   Culture NO GROWTH 2 DAYS  Final   Report Status PENDING  Incomplete  Urine culture     Status: Abnormal (Preliminary result)   Collection  Time: 05/20/16 11:55 AM  Result Value Ref Range Status   Specimen Description URINE, CATHETERIZED  Final   Special Requests Normal  Final   Culture (A)  Final    >=100,000 COLONIES/mL PROTEUS MIRABILIS >=100,000 COLONIES/mL ESCHERICHIA COLI SUSCEPTIBILITIES TO FOLLOW Performed at [facility]    Report Status PENDING  Incomplete  Culture, blood (routine x 2)     Status: None (Preliminary result)   Collection Time: 05/20/16 12:15 PM  Result Value Ref Range Status   Specimen Description BLOOD RIGHT FOREARM  Final   Special Requests   Final    BOTTLES DRAWN AEROBIC AND ANAEROBIC  AER 3CC ANA 5CC   Culture NO GROWTH 2 DAYS  Final   Report Status PENDING  Incomplete  MRSA PCR Screening     Status: None   Collection Time: 05/20/16  4:39 PM  Result Value Ref Range Status   MRSA by PCR NEGATIVE NEGATIVE Final    Comment:        The GeneXpert MRSA Assay (FDA approved for NASAL specimens only), is one component of a comprehensive MRSA colonization surveillance program. It is not intended to diagnose MRSA infection nor to guide or monitor treatment for MRSA infections.     Radiology Reports Ct Head Wo Contrast  Result Date: 05/20/2016 CLINICAL DATA:  Worsening mental status.  Nursing home patient. EXAM: CT HEAD WITHOUT CONTRAST TECHNIQUE: Contiguous axial images were obtained from the base of the skull through the vertex without intravenous contrast. COMPARISON:  10/17/2015 FINDINGS: Brain: Cerebral atrophy. Mild low density in the periventricular white matter likely related to small vessel disease. Ventriculomegaly  is similar over multiple prior exams and favored to be related to atrophy. No infarct, mass lesion, intra-axial, or extra-axial fluid collection. No hemorrhage. Vascular: No hyperdense vessel or unexpected calcification. Skull: No significant soft tissue swelling.  No skull fracture. Sinuses/Orbits: Normal imaged orbits and globes. Hypoplastic right frontal sinus. Other  paranasal sinuses and mastoid air cells clear. Cerumen in the external ear canals. Other: None IMPRESSION: 1.  No acute intracranial abnormality. 2.  Cerebral atrophy and small vessel ischemic change. 3. Chronic ventriculomegaly which is favored to be related to cerebral atrophy. Electronically Signed   By: Jeronimo Greaves M.D.   On: 05/20/2016 11:28   US Abdomen Complete  Result Date: 05/20/2016 CLINICAL DATA:  Sepsis. Elevated liver function tests. Flank bruising. EXAM: ABDOMEN ULTRASOUND COMPLETE COMPARISON:  01/22/2015 and abdomen and pelvis CT dated 11/25/2014. FINDINGS: Gallbladder: No gallstones or wall thickening visualized. No sonographic Murphy sign noted by sonographer. Common bile duct: Diameter: 9.7 mm proximally. Previously 9 mm. No visible stones. Liver: Multiple cysts. IVC: No abnormality visualized. Pancreas: Visualized portion unremarkable. Spleen: Size and appearance within normal limits. Right Kidney: Length: 10.3 cm. Normal echogenicity. Interval 1.9 cm calculus in the midportion of the kidney. This appears to be within the previously demonstrated prominent extrarenal pelvis. No calyceal dilatation. Left Kidney: Length: 11.1 cm. 1.0 cm upper pole cyst. Normal echogenicity. No hydronephrosis. The previously demonstrated lower pole calculus is not currently visualized. Abdominal aorta: No aneurysm visualized. Other findings: None. IMPRESSION: 1. Interval 1.9 cm calculus in the previously demonstrated a prominent extrarenal pelvis on the right. 2. The previously seen lower pole left renal calculus is no longer demonstrated. 3. Previously demonstrated liver cysts and mildly dilated common duct. Electronically Signed   By: Beckie Salts M.D.   On: 05/20/2016 14:43   Dg Chest Portable 1 View  Result Date: 05/20/2016 CLINICAL DATA:  Sepsis EXAM: PORTABLE CHEST 1 VIEW COMPARISON:  Jan 22, 2015 FINDINGS: There is slight bibasilar lung atelectatic change. The lungs elsewhere are clear. Heart size and  pulmonary vascularity are normal. No adenopathy. There is atherosclerotic calcification in the aortic arch region. IMPRESSION: Slight bibasilar atelectasis. Lungs elsewhere clear. There is aortic atherosclerosis. Electronically Signed   By: Bretta Bang III M.D.   On: 05/20/2016 15:10     CBC  Recent Labs Lab 05/20/16 1111 05/21/16 0251 05/22/16 0434  WBC 22.3* 17.2* 16.7*  HGB 12.4 9.7* 10.0*  HCT 38.0 29.7* 30.0*  PLT 171 104* 116*  MCV 82.3 80.9 80.7  MCH 26.9 26.5 26.8  MCHC 32.7 32.7 33.2  RDW 17.0* 16.5* 16.5*  LYMPHSABS 1.6  --   --   MONOABS 2.7*  --   --   EOSABS 0.0  --   --   BASOSABS 0.0  --   --     Chemistries   Recent Labs Lab 05/20/16 1111 05/21/16 0251 05/22/16 0434  NA 146* 148* 149*  K 3.8 3.1* 3.6  CL 107 122* 119*  CO2 14* 16* 20*  GLUCOSE 150* 114* 109*  BUN 99* 101* 107*  CREATININE 5.84* 5.20* 5.98*  CALCIUM 7.1* 6.1* 7.1*  MG  --   --  2.1  AST 1,067* 358* 143*  ALT 762* 342* 215*  ALKPHOS 173* 101 92  BILITOT 0.9 0.7 0.6   ------------------------------------------------------------------------------------------------------------------ estimated creatinine clearance is 6.7 mL/min (by C-G formula based on SCr of 5.98 mg/dL (H)). ------------------------------------------------------------------------------------------------------------------ No results for input(s): HGBA1C in the last 72 hours. ------------------------------------------------------------------------------------------------------------------ No results for input(s): CHOL,  HDL, LDLCALC, TRIG, CHOLHDL, LDLDIRECT in the last 72 hours. ------------------------------------------------------------------------------------------------------------------ No results for input(s): TSH, T4TOTAL, T3FREE, THYROIDAB in the last 72 hours.  Invalid input(s): FREET3 ------------------------------------------------------------------------------------------------------------------ No  results for input(s): VITAMINB12, FOLATE, FERRITIN, TIBC, IRON, RETICCTPCT in the last 72 hours.  Coagulation profile  Recent Labs Lab 05/20/16 1111  INR 1.27    No results for input(s): DDIMER in the last 72 hours.  Cardiac Enzymes  Recent Labs Lab 05/20/16 1917 05/20/16 2301 05/21/16 0251  TROPONINI 0.30* 0.25* 0.21*   ------------------------------------------------------------------------------------------------------------------ Invalid input(s): POCBNP    Assessment & Plan   75 year old female with past medical history of advanced dementia, hypertension, history of nephrolithiasis, chronic kidney disease, chronic anemia who presents to the hospital due to altered mental status.   1. Acute encephalopathy suspect due to sepsis due to UTI as well as acute kidney injury Currently at baseline   2. Sepsis-due to UTI urine culture with Escherichia coli and Proteus Continue meropenem   4. Acute kidney injury-secondary to sepsis and severe dehydration.  Appreciate nephrology input    5. Acute rhabdomyolysis-secondary to dehydration and sepsis. IMPROVING  6. Abnormal LFTs-suspect due to shock liver will repeat LFTs in the morning  7. Elevated troponin-supply demand ischemia from the underlying sepsis. -Observe on telemetry, cycle her cardiac markers.  8. Dementia-continue Aricept, Namenda.  9. CODE STATUS DO NOT RESUSCITATE I discussed with her daughter regarding patient's prognosis she reports the patient was under hospice services still last month and then they released her. Now she is back with similar type of pain she would like to talk palliative care to discuss future goals I presented to her most form may be useful for the future. She still would like to talk to palliative care.        Code Status Orders        Start     Ordered   05/20/16 1638  Do not attempt resuscitation (DNR)  Continuous    Question Answer Comment  In the event of  cardiac or respiratory ARREST Do not call a "code blue"   In the event of cardiac or respiratory ARREST Do not perform Intubation, CPR, defibrillation or ACLS   In the event of cardiac or respiratory ARREST Use medication by any route, position, wound care, and other measures to relive pain and suffering. May use oxygen, suction and manual treatment of airway obstruction as needed for comfort.      05/20/16 1637    Code Status History    Date Active Date Inactive Code Status Order ID Comments User Context   02/17/2015  2:11 PM 02/18/2015 10:31 PM Full Code [id number]  Vanna Scotland, MD Inpatient   01/23/2015  1:00 AM 02/04/2015  7:07 PM DNR [id number]  Crissie Figures, MD Inpatient    Advance Directive Documentation   Flowsheet Row Most Recent Value  Type of Advance Directive  Healthcare Power of Attorney, Living will  Pre-existing out of facility DNR order (yellow form or pink MOST form)  Yellow form placed in chart (order not valid for inpatient use)  "MOST" Form in Place?  No data           ConsultsNephrology   DVT Prophylaxis  SCDs  Lab Results  Component Value Date   PLT 116 (L) 05/22/2016     Time Spent in minutes   Greater than 50% of time spent in care coordination and counseling patient regarding the condition and plan of care.   Auburn Bilberry M.D  on 05/22/2016 at 12:45 PM  Between 7am to 6pm - Pager - [phone]  After 6pm go to [email or web address] - password [email or web address] [facility]  [facility]   Office  [phone]

## 2016-05-22 NOTE — Progress Notes (Signed)
Pharmacy Consult for Electrolyte Monitoring Indication: Hypokalemia  Allergies  Allergen Reactions  . Amoxicillin-Pot Clavulanate Nausea And Vomiting  . Augmentin [Amoxicillin-Pot Clavulanate] Rash  . Latex Rash    Patient Measurements: Height: 5\' 3"  (160 cm) Weight: 134 lb 7.7 oz (61 kg) IBW/kg (Calculated) : 52.4    Vital Signs: Temp: 98.3 F (36.8 C) (09/26 0632) Temp Source: Oral (09/26 47820632) BP: 169/86 (09/26 95620632) Pulse Rate: 81 (09/26 13080632) Intake/Output from previous day: 09/25 0701 - 09/26 0700 In: 2387.6 [I.V.:1815.6; IV Piggyback:572] Out: 1000 [Urine:1000] Intake/Output from this shift: No intake/output data recorded.  Labs:  Recent Labs  05/20/16 1111 05/21/16 0251 05/22/16 0434  WBC 22.3* 17.2* 16.7*  HGB 12.4 9.7* 10.0*  HCT 38.0 29.7* 30.0*  PLT 171 104* 116*  APTT 31  --   --   INR 1.27  --   --      Recent Labs  05/20/16 1111 05/21/16 0251 05/22/16 0434  NA 146* 148* 149*  K 3.8 3.1* 3.6  CL 107 122* 119*  CO2 14* 16* 20*  GLUCOSE 150* 114* 109*  BUN 99* 101* 107*  CREATININE 5.84* 5.20* 5.98*  CALCIUM 7.1* 6.1* 7.1*  MG  --   --  2.1  PHOS  --   --  4.1  PROT 6.9 4.7*  --   ALBUMIN 3.2* 2.1*  --   AST 1,067* 358*  --   ALT 762* 342*  --   ALKPHOS 173* 101  --   BILITOT 0.9 0.7  --    Estimated Creatinine Clearance: 6.7 mL/min (by C-G formula based on SCr of 5.98 mg/dL (H)).    Recent Labs  05/20/16 1632 05/21/16 2043 05/22/16 0744  GLUCAP 99 105* 106*    Medical History: Past Medical History:  Diagnosis Date  . Alzheimer disease   . Alzheimer disease   . Anemia   . Arthritis 2005  . Chronic kidney disease   . Dementia 2012  . H/O cystitis 2010  . Hepatic cyst   . Hypertension 2001   on medicaitons for at least 10 years  . Hypokalemia   . Kidney stone   . Maxillary sinus fracture (HCC)    after fall 2013, with LOC  . Other esophagitis 2011  . Personal history of tobacco use, presenting hazards to health  2011  . Rectal mass 2010    Medications:  Scheduled:  . donepezil  10 mg Oral QHS  . heparin  5,000 Units Subcutaneous Q8H  . memantine  10 mg Oral BID  . meropenem (MERREM) IV  1 g Intravenous Q24H  . mirtazapine  30 mg Oral QHS  . QUEtiapine  50 mg Oral QHS  . sodium chloride flush  3 mL Intravenous Q12H    Assessment:  Pharmacy consulted to assist in managing electrolytes in this 75 y/o F with  ARF due to sepsis and rhabdomyolysis.   Plan:  K=3.6, Mg=2.1 phos=4.1 Recheck K in the AM.  Endrit Gittins D Syrus Nakama 05/22/2016,7:59 AM

## 2016-05-23 DIAGNOSIS — Z515 Encounter for palliative care: Secondary | ICD-10-CM

## 2016-05-23 DIAGNOSIS — F0391 Unspecified dementia with behavioral disturbance: Secondary | ICD-10-CM

## 2016-05-23 DIAGNOSIS — R319 Hematuria, unspecified: Secondary | ICD-10-CM

## 2016-05-23 DIAGNOSIS — N390 Urinary tract infection, site not specified: Secondary | ICD-10-CM

## 2016-05-23 DIAGNOSIS — Z66 Do not resuscitate: Secondary | ICD-10-CM

## 2016-05-23 DIAGNOSIS — R627 Adult failure to thrive: Secondary | ICD-10-CM

## 2016-05-23 DIAGNOSIS — Z789 Other specified health status: Secondary | ICD-10-CM

## 2016-05-23 LAB — URINE CULTURE
Culture: >=100000 — AB
Special Requests: NORMAL

## 2016-05-23 LAB — COMPREHENSIVE METABOLIC PANEL
ALBUMIN: 2 g/dL — AB (ref 3.5–5.0)
ALK PHOS: 85 U/L (ref 38–126)
ALT: 125 U/L — AB (ref 14–54)
AST: 73 U/L — AB (ref 15–41)
Anion gap: 10 (ref 5–15)
BUN: 93 mg/dL — AB (ref 6–20)
CALCIUM: 7.2 mg/dL — AB (ref 8.9–10.3)
CHLORIDE: 109 mmol/L (ref 101–111)
CO2: 25 mmol/L (ref 22–32)
CREATININE: 5.61 mg/dL — AB (ref 0.44–1.00)
GFR calc Af Amer: 8 mL/min — ABNORMAL LOW (ref >60)
GFR calc non Af Amer: 7 mL/min — ABNORMAL LOW (ref >60)
GLUCOSE: 99 mg/dL (ref 65–99)
Potassium: 2.7 mmol/L — CL (ref 3.5–5.1)
SODIUM: 144 mmol/L (ref 135–145)
Total Bilirubin: 0.6 mg/dL (ref 0.3–1.2)
Total Protein: 4.6 g/dL — ABNORMAL LOW (ref 6.5–8.1)

## 2016-05-23 LAB — C DIFFICILE QUICK SCREEN W PCR REFLEX
C DIFFICILE (CDIFF) TOXIN: NEGATIVE
C Diff antigen: NEGATIVE
C Diff interpretation: NOT DETECTED

## 2016-05-23 MED ORDER — LORAZEPAM 1 MG PO TABS: 1.0000 mg | ORAL_TABLET | ORAL | Status: DC | PRN

## 2016-05-23 MED ORDER — POTASSIUM CHLORIDE 10 MEQ/100ML IV SOLN
10.0000 meq | INTRAVENOUS | Status: DC
  Administered 2016-05-23 (×3): 10 meq via INTRAVENOUS
  Filled 2016-05-23 (×4): qty 100

## 2016-05-23 MED ORDER — DEXTROSE 5 % IV SOLN
1.0000 g | INTRAVENOUS | Status: DC
  Filled 2016-05-23: qty 10

## 2016-05-23 MED ORDER — LOPERAMIDE HCL 2 MG PO CAPS
2.0000 mg | ORAL_CAPSULE | ORAL | Status: DC | PRN
  Administered 2016-05-23: 2 mg via ORAL
  Filled 2016-05-23: qty 1

## 2016-05-23 MED ORDER — POTASSIUM CHLORIDE 20 MEQ PO PACK: 40.0000 meq | PACK | ORAL | Status: DC

## 2016-05-23 MED ORDER — SODIUM CHLORIDE 0.9 % IV SOLN
INTRAVENOUS | Status: DC
  Administered 2016-05-23: 11:00:00 via INTRAVENOUS

## 2016-05-23 MED ORDER — MORPHINE SULFATE (CONCENTRATE) 10 MG/0.5ML PO SOLN: 5.0000 mg | ORAL | Status: DC | PRN

## 2016-05-23 MED ORDER — ZINC OXIDE 11.3 % EX CREA
TOPICAL_CREAM | Freq: Two times a day (BID) | CUTANEOUS | Status: DC
  Administered 2016-05-23 – 2016-05-24 (×4): via TOPICAL
  Filled 2016-05-23: qty 56

## 2016-05-23 NOTE — Consult Note (Signed)
Consultation Note Date: 05/23/2016   Patient Name: Kelli Matthews  DOB: 1941-08-18  MRN: [id number]  Age / Sex: 75 y.o., female  PCP: No Pcp Per Patient Referring Physician: Auburn Bilberry, MD  Reason for Consultation: Establishing goals of care and Psychosocial/spiritual support  HPI/Patient Profile: 75 y.o. female   admitted on 05/20/2016 with  known history of [name], chronic kidney disease, hypertension, history of nephrolithiasis, who presents to the hospital from a skilled nursing facility/memory unit to altered mental status.   Admitted with UTI, rhabdomyolysis, abnormal LFTs, AKI admitted for treatment  Per family she has had continued physical, functional and cognitive decline over the past several years.  Family faces advanced directive decisions and anticipatory care needs    Clinical Assessment and Goals of Care:  This NP Lorinda Creed reviewed medical records, received report from team, assessed the patient and then meet at the patient's bedside along with her daughter Kelli Matthews to discuss diagnosis, prognosis, GOC, EOL wishes disposition and options.  A detailed discussion was had today regarding advanced directives.  Concepts specific to code status, artifical feeding and hydration, continued IV antibiotics and rehospitalization was had.  The difference between a aggressive medical intervention path  and a palliative comfort care path for this patient at this time was had.  Values and goals of care important to patient and family were attempted to be elicited.  MOST form introduced  Concept of Hospice and Palliative Care were discussed  Natural trajectory and expectations at EOL were discussed.  Questions and concerns addressed.   Family encouraged to call with questions or concerns.  PMT will continue to support holistically.    SUMMARY OF RECOMMENDATIONS    Code  Status/Advance Care Planning:  DNR   Symptom Management:   Pain/Dyspnea: Roxanol 5 mg po/sl every 1 hr prn  Agitation: Ativan 1 mg po every 4 hrs prn  Palliative Prophylaxis:   Aspiration, Bowel Regimen, Frequent Pain Assessment and Oral Care  Additional Recommendations (Limitations, Scope, Preferences):  Focus of care is comfort and dignity   Avoid Hospitalization, Minimize Medications, No Artificial Feeding, No Blood Transfusions, No Diagnostics, No Glucose Monitoring, No IV Antibiotics and No IV Fluids   Diet as tolerated, offer sips and bites  Medications for symptom management    Psycho-social/Spiritual:   Desire for further Chaplaincy support:no  Additional Recommendations: Education on Hospice  Prognosis:   < 2 weeks--Evaluate in the morning for disposition options  Discharge Planning: To Be Determined      Primary Diagnoses: Present on Admission: . Sepsis (HCC)   I have reviewed the medical record, interviewed the patient and family, and examined the patient. The following aspects are pertinent.  Past Medical History:  Diagnosis Date  . Alzheimer disease   . Alzheimer disease   . Anemia   . Arthritis 2005  . Chronic kidney disease   . Dementia 2012  . H/O cystitis 2010  . Hepatic cyst   . Hypertension 2001   on medicaitons for at least 10 years  .  Hypokalemia   . Kidney stone   . Maxillary sinus fracture (HCC)    after fall 2013, with LOC  . Other esophagitis 2011  . Personal history of tobacco use, presenting hazards to health 2011  . Rectal mass 2010   Social History   Social History  . Marital status: Married    Spouse name: N/A  . Number of children: 2  . Years of education: N/A   Occupational History  . Retired - Corporate investment bankerTeacher/ working at [employer]    Social History Main Topics  . Smoking status: Former Smoker    Years: 17.00    Types: Cigarettes    Quit date: 08/28/1995  . Smokeless tobacco: Never Used     Comment: quit in  1977  . Alcohol use No  . Drug use: No  . Sexual activity: No   Other Topics Concern  . None   Social History Narrative   Regular Exercise -  1 hour during work daily (at [employer]   Daily Caffeine Use:  2 - 4 daily    Family History  Problem Relation Age of Onset  . Ovarian cancer Sister   . Early death Sister [id number]  . Alzheimer's disease Mother   . Stroke Mother   . Depression Son    Scheduled Meds: . citalopram  10 mg Oral Daily  . donepezil  10 mg Oral QHS  . heparin  5,000 Units Subcutaneous Q8H  . memantine  10 mg Oral BID  . meropenem (MERREM) IV  1 g Intravenous Q24H  . mirtazapine  30 mg Oral QHS  . potassium chloride  10 mEq Intravenous Q1 Hr x 4  . QUEtiapine  50 mg Oral QHS  . sodium chloride flush  3 mL Intravenous Q12H  . zinc oxide   Topical BID   Continuous Infusions: .  sodium bicarbonate  infusion 1000 mL 125 mL/hr at 05/23/16 0058   PRN Meds:.acetaminophen **OR** acetaminophen, loperamide, ondansetron **OR** ondansetron (ZOFRAN) IV Medications Prior to Admission:  Prior to Admission medications   Medication Sig Start Date End Date Taking? Authorizing Provider  acetaminophen (TYLENOL) 325 MG tablet Take 650 mg by mouth every 4 (four) hours as needed for mild pain or fever.   Yes Historical Provider, MD  aspirin (ASPIRIN CHILDRENS) 81 MG chewable tablet Chew 1 tablet (81 mg total) by mouth daily. 03/24/15  Yes Shelia MediaJennifer A Walker, MD  cholecalciferol (VITAMIN D) 1000 UNITS tablet TAKE ONE TABLET BY MOUTH EACH DAY. 05/13/14  Yes Shelia MediaJennifer A Walker, MD  citalopram (CELEXA) 20 MG tablet Take 1 tablet (20 mg total) by mouth daily. 02/04/15  Yes Shaune PollackQing Chen, MD  desonide (DESOWEN) 0.05 % cream Apply 1 application topically as needed (to face).  08/01/12  Yes Historical Provider, MD  donepezil (ARICEPT) 10 MG tablet TAKE ONE TABLET BY MOUTH EACH DAY AT BEDTIME. 07/06/13  Yes Shelia MediaJennifer A Walker, MD  fluticasone (FLONASE) 50 MCG/ACT nasal spray Two sprays in each nostril  daily. 09/09/12  Yes Raquel Conni ElliotM Rey, NP  ibuprofen (ADVIL,MOTRIN) 400 MG tablet Take 400 mg by mouth 3 (three) times daily.   Yes Historical Provider, MD  Lactobacillus Rhamnosus, GG, (CULTURELLE) CAPS Take 1 capsule by mouth daily. 05/19/16 05/28/16 Yes Historical Provider, MD  Loperamide HCl (IMODIUM PO) Take 2 mg by mouth daily.    Yes Historical Provider, MD  memantine (NAMENDA) 10 MG tablet Take 10 mg by mouth 2 (two) times daily.   Yes Historical Provider, MD  metoprolol  succinate (TOPROL-XL) 25 MG 24 hr tablet Take 1 tablet (25 mg total) by mouth daily. 03/25/12  Yes Shelia Media, MD  mirtazapine (REMERON) 15 MG tablet Take 30 mg by mouth at bedtime.  12/10/12  Yes Historical Provider, MD  Multiple Vitamins-Minerals (THERAVIM-M) TABS TAKE ONE TABLET BY MOUTH EACH DAY. 05/28/13  Yes Shelia Media, MD  nitrofurantoin, macrocrystal-monohydrate, (MACROBID) 100 MG capsule Take 100 mg by mouth 2 (two) times daily. 05/19/16 05/25/16 Yes Historical Provider, MD  QUEtiapine (SEROQUEL) 50 MG tablet Take 50 mg by mouth at bedtime.   Yes Historical Provider, MD  Skin Protectants, Misc. (EUCERIN) cream Apply 1 application topically daily.   Yes Historical Provider, MD  gentamicin cream (GARAMYCIN) 0.1 % APPLY TWICE DAILY TO SKIN LESIONS ON UPPER BACK    Shelia Media, MD   Allergies  Allergen Reactions  . Amoxicillin-Pot Clavulanate Nausea And Vomiting  . Augmentin [Amoxicillin-Pot Clavulanate] Rash  . Latex Rash   Review of Systems  Unable to perform ROS: Dementia    Physical Exam  Constitutional: She appears lethargic. She appears cachectic. She appears ill.  Cardiovascular: Normal rate, regular rhythm and normal heart sounds.   Pulmonary/Chest: She has decreased breath sounds in the right lower field and the left lower field.  Musculoskeletal:  -generalized weakness and atrophy  Neurological: She appears lethargic.  Skin: Skin is warm and dry.    Vital Signs: BP (!) 143/67 (BP  Location: Left Arm)   Pulse 80   Temp 98.2 F (36.8 C) (Oral)   Resp 16   Ht 5\' 3"  (1.6 m)   Wt 61 kg (134 lb 7.7 oz)   SpO2 98%   BMI 23.82 kg/m  Pain Assessment: Faces POSS *See Group Information*: 2-Acceptable,Slightly drowsy, easily aroused     SpO2: SpO2: 98 % O2 Device:SpO2: 98 % O2 Flow Rate: .   IO: Intake/output summary:  Intake/Output Summary (Last 24 hours) at 05/23/16 0721 Last data filed at 05/23/16 5409  Gross per 24 hour  Intake          2948.82 ml  Output             1050 ml  Net          1898.82 ml    LBM: Last BM Date: 05/22/16 Baseline Weight: Weight: 63.5 kg (140 lb) Most recent weight: Weight: 61 kg (134 lb 7.7 oz)      Palliative Assessment/Data: 30 % at best   Discussed with Dr Allena Katz  Time In: 0930 Time Out: 1045 Time Total: 75 min Greater than 50%  of this time was spent counseling and coordinating care related to the above assessment and plan.  Signed by: Lorinda Creed, NP   Please contact Palliative Medicine Team phone at [phone] for questions and concerns.  For individual provider: See Loretha Stapler

## 2016-05-23 NOTE — Progress Notes (Signed)
[facility]  ROUNDING NOTE   Subjective:  Renal function slightly better today. UOP 1 liter over the preceding 24 hours. Discussed care with the patient's daughter today. It appears that they have opted for hospice care which appears to be appropriate.   Objective:  Vital signs in last 24 hours:  Temp:  [98.2 F (36.8 C)-99 F (37.2 C)] 99 F (37.2 C) (09/27 1309) Pulse Rate:  [72-80] 76 (09/27 1309) Resp:  [16] 16 (09/27 1309) BP: (115-143)/(48-67) 115/54 (09/27 1309) SpO2:  [96 %-98 %] 96 % (09/27 1309)  Weight change:  Filed Weights   05/20/16 1359 05/20/16 1645  Weight: 63.5 kg (140 lb) 61 kg (134 lb 7.7 oz)    Intake/Output: I/O last 3 completed shifts: In: 4746.8 [P.O.:241; I.V.:3281.4; Other:550; IV Piggyback:674.4] Out: 2050 [Urine:2050]   Intake/Output this shift:  Total I/O In: 828 [I.V.:537; IV Piggyback:291] Out: 350 [Urine:350]  Physical Exam: General: No acute distress  Head: Normocephalic, atraumatic. Moist oral mucosal membranes  Eyes: Anicteric  Neck: Supple, trachea midline  Lungs:  Clear to auscultation, normal effort  Heart: S1S2 no rubs  Abdomen:  Soft, nontender, bowel sounds present  Extremities: no peripheral edema.  Neurologic: Awake, but not answering questions.   Skin: No lesions       Basic Metabolic Panel:  Recent Labs Lab 05/20/16 1111 05/21/16 0251 05/22/16 0434 05/23/16 0455  NA 146* 148* 149* 144  K 3.8 3.1* 3.6 2.7*  CL 107 122* 119* 109  CO2 14* 16* 20* 25  GLUCOSE 150* 114* 109* 99  BUN 99* 101* 107* 93*  CREATININE 5.84* 5.20* 5.98* 5.61*  CALCIUM 7.1* 6.1* 7.1* 7.2*  MG  --   --  2.1  --   PHOS  --   --  4.1  --     Liver Function Tests:  Recent Labs Lab 05/20/16 1111 05/21/16 0251 05/22/16 0434 05/23/16 0455  AST 1,067* 358* 143* 73*  ALT 762* 342* 215* 125*  ALKPHOS 173* 101 92 85  BILITOT 0.9 0.7 0.6 0.6  PROT 6.9 4.7* 5.0* 4.6*  ALBUMIN 3.2* 2.1* 2.1* 2.0*   No results for  input(s): LIPASE, AMYLASE in the last 168 hours. No results for input(s): AMMONIA in the last 168 hours.  CBC:  Recent Labs Lab 05/20/16 1111 05/21/16 0251 05/22/16 0434  WBC 22.3* 17.2* 16.7*  NEUTROABS 18.0*  --   --   HGB 12.4 9.7* 10.0*  HCT 38.0 29.7* 30.0*  MCV 82.3 80.9 80.7  PLT 171 104* 116*    Cardiac Enzymes:  Recent Labs Lab 05/20/16 1111 05/20/16 1528 05/20/16 1917 05/20/16 2301 05/21/16 0251  CKTOTAL 6,027*  --   --   --  3,903*  TROPONINI 0.36* 0.28* 0.30* 0.25* 0.21*    BNP: Invalid input(s): POCBNP  CBG:  Recent Labs Lab 05/20/16 1632 05/21/16 2043 05/22/16 0744  GLUCAP 99 105* 106*    Microbiology: Results for orders placed or performed during the hospital encounter of 05/20/16  Culture, blood (routine x 2)     Status: None (Preliminary result)   Collection Time: 05/20/16 11:11 AM  Result Value Ref Range Status   Specimen Description BLOOD LEFT ARM  Final   Special Requests BOTTLES DRAWN AEROBIC AND ANAEROBIC  10CC  Final   Culture NO GROWTH 3 DAYS  Final   Report Status PENDING  Incomplete  Urine culture     Status: Abnormal   Collection Time: 05/20/16 11:55 AM  Result Value Ref Range Status  Specimen Description URINE, CATHETERIZED  Final   Special Requests Normal  Final   Culture (A)  Final    >=100,000 COLONIES/mL PROTEUS MIRABILIS >=100,000 COLONIES/mL ESCHERICHIA COLI    Report Status 05/23/2016 FINAL  Final   Organism ID, Bacteria PROTEUS MIRABILIS (A)  Final   Organism ID, Bacteria ESCHERICHIA COLI (A)  Final      Susceptibility   Escherichia coli - MIC*    AMPICILLIN >=32 RESISTANT Resistant     CEFAZOLIN 16 SENSITIVE Sensitive     CEFTRIAXONE <=1 SENSITIVE Sensitive     CIPROFLOXACIN <=0.25 SENSITIVE Sensitive     GENTAMICIN <=1 SENSITIVE Sensitive     IMIPENEM <=0.25 SENSITIVE Sensitive     NITROFURANTOIN <=16 SENSITIVE Sensitive     TRIMETH/SULFA <=20 SENSITIVE Sensitive     AMPICILLIN/SULBACTAM 16 INTERMEDIATE  Intermediate     PIP/TAZO <=4 SENSITIVE Sensitive     Extended ESBL NEGATIVE Sensitive     * >=100,000 COLONIES/mL ESCHERICHIA COLI   Proteus mirabilis - MIC*    AMPICILLIN <=2 SENSITIVE Sensitive     CEFAZOLIN <=4 SENSITIVE Sensitive     CEFTRIAXONE <=1 SENSITIVE Sensitive     CIPROFLOXACIN <=0.25 SENSITIVE Sensitive     GENTAMICIN <=1 SENSITIVE Sensitive     IMIPENEM 2 SENSITIVE Sensitive     NITROFURANTOIN 128 RESISTANT Resistant     TRIMETH/SULFA <=20 SENSITIVE Sensitive     AMPICILLIN/SULBACTAM <=2 SENSITIVE Sensitive     PIP/TAZO <=4 SENSITIVE Sensitive     * >=100,000 COLONIES/mL PROTEUS MIRABILIS  Culture, blood (routine x 2)     Status: None (Preliminary result)   Collection Time: 05/20/16 12:15 PM  Result Value Ref Range Status   Specimen Description BLOOD RIGHT FOREARM  Final   Special Requests   Final    BOTTLES DRAWN AEROBIC AND ANAEROBIC  AER 3CC ANA 5CC   Culture NO GROWTH 3 DAYS  Final   Report Status PENDING  Incomplete  MRSA PCR Screening     Status: None   Collection Time: 05/20/16  4:39 PM  Result Value Ref Range Status   MRSA by PCR NEGATIVE NEGATIVE Final    Comment:        The GeneXpert MRSA Assay (FDA approved for NASAL specimens only), is one component of a comprehensive MRSA colonization surveillance program. It is not intended to diagnose MRSA infection nor to guide or monitor treatment for MRSA infections.   C difficile quick scan w PCR reflex     Status: None   Collection Time: 05/23/16 10:38 AM  Result Value Ref Range Status   C Diff antigen NEGATIVE NEGATIVE Final   C Diff toxin NEGATIVE NEGATIVE Final   C Diff interpretation No C. difficile detected.  Final    Coagulation Studies: No results for input(s): LABPROT, INR in the last 72 hours.  Urinalysis: No results for input(s): COLORURINE, LABSPEC, PHURINE, GLUCOSEU, HGBUR, BILIRUBINUR, KETONESUR, PROTEINUR, UROBILINOGEN, NITRITE, LEUKOCYTESUR in the last 72 hours.  Invalid input(s):  APPERANCEUR    Imaging: No results found.   Medications:   . sodium chloride 10 mL/hr at 05/23/16 1035   . citalopram  10 mg Oral Daily  . mirtazapine  30 mg Oral QHS  . QUEtiapine  50 mg Oral QHS  . sodium chloride flush  3 mL Intravenous Q12H  . zinc oxide   Topical BID   acetaminophen **OR** acetaminophen, loperamide, LORazepam, morphine CONCENTRATE, [DISCONTINUED] ondansetron **OR** ondansetron (ZOFRAN) IV  Assessment/ Plan:  75 y.o. female with a PMHx  of severe [name], anemia, hypertension, nephrolithiasis with recent intervention, who [facility]/2016 for evaluation of altered mental status. We used to provide dialysis care to the patient's deceased husband. Patient has been at a memory care unit for her underlying [name].   1. Acute renal failure secondary to sepsis/rhabdomyolysis.  2. Hypernatreamia. 3.  Hypokalemia. 4.  Nephrolithiasis. 5.  Metabolic acidosis. 6.  Rhabdomyolysis.  Plan: Patient has very advanced [name].  She has developed severe acute renal failure.  Unfortunately treating the acute renal clear would not likely change the underlying [name] which places her at risk for infections and acute renal failure.  Agree with proceeding with comfort and hospice care.  Ok to stop IVFs upon discharge.  We wish the patient a peaceful transition.    LOS: 3 Loyd Salvador 9/27/20172:31 PM

## 2016-05-23 NOTE — Progress Notes (Signed)
[facility]                                                                                                                                                                                            Patient Demographics   Kelli Matthews, is a 75 y.o. female, DOB - March 19, 1941, ZOX:[id number]  Admit date - 05/20/2016   Admitting Physician Houston Siren, MD  Outpatient Primary MD for the patient is No PCP Per Patient   LOS - 3  Subjective: Patient's daughter at bedside patient's symptoms to be back to her baseline dementia Renal function without much improvement liver function trending down   Review of Systems:   CONSTITUTIONAL: Unable to provide due to dementia Vitals:   Vitals:   05/22/16 0632 05/22/16 1223 05/22/16 2047 05/23/16 0455  BP: (!) 169/86 128/65 (!) 134/48 (!) 143/67  Pulse: 81 82 72 80  Resp: 18 16 16 16   Temp: 98.3 F (36.8 C) 97.4 F (36.3 C) 98.6 F (37 C) 98.2 F (36.8 C)  TempSrc: Oral  Oral Oral  SpO2: 96% 97% 97% 98%  Weight:      Height:        Wt Readings from Last 3 Encounters:  05/20/16 134 lb 7.7 oz (61 kg)  10/17/15 122 lb 2.2 oz (55.4 kg)  08/25/15 126 lb 6 oz (57.3 kg)     Intake/Output Summary (Last 24 hours) at 05/23/16 1155 Last data filed at 05/23/16 0900  Gross per 24 hour  Intake          2948.82 ml  Output              700 ml  Net          2248.82 ml    Physical Exam:   GENERAL: Pleasant-appearing in no apparent distress.  HEAD, EYES, EARS, NOSE AND THROAT: Atraumatic, normocephalic. . Pupils equal and reactive to light. Sclerae anicteric. No conjunctival injection. No oro-pharyngeal erythema.  NECK: Supple. There is no jugular venous distention. No bruits, no lymphadenopathy, no thyromegaly.  HEART: Regular rate and rhythm,. No murmurs, no rubs, no clicks.  LUNGS: Clear to auscultation bilaterally. No rales or rhonchi. No wheezes.  ABDOMEN: Soft, flat, nontender, nondistended.  Has good bowel sounds. No hepatosplenomegaly appreciated.  EXTREMITIES: No evidence of any cyanosis, clubbing, or peripheral edema.  +2 pedal and radial pulses bilaterally.  NEUROLOGIC: The patient is alert, not oriented to place person or time with no focal motor or sensory deficits appreciated bilaterally.  SKIN: Moist and warm with no rashes appreciated.  Psych: Not anxious, depressed LN:  No inguinal LN enlargement    Antibiotics   Anti-infectives    Start     Dose/Rate Route Frequency Ordered Stop   05/23/16 1000  cefTRIAXone (ROCEPHIN) 1 g in dextrose 5 % 50 mL IVPB  Status:  Discontinued     1 g 100 mL/hr over 30 Minutes Intravenous Every 24 hours 05/23/16 0902 05/23/16 0941   05/20/16 2200  meropenem (MERREM) 1 g in sodium chloride 0.9 % 100 mL IVPB  Status:  Discontinued     1 g 200 mL/hr over 30 Minutes Intravenous Every 24 hours 05/20/16 1651 05/23/16 0902   05/20/16 1215  cefTRIAXone (ROCEPHIN) 2 g in dextrose 5 % 50 mL IVPB     2 g 100 mL/hr over 30 Minutes Intravenous  Once 05/20/16 1205 05/20/16 1509   05/20/16 1215  levofloxacin (LEVAQUIN) IVPB 500 mg     500 mg 100 mL/hr over 60 Minutes Intravenous  Once 05/20/16 1206 05/20/16 1408      Medications   Scheduled Meds: . citalopram  10 mg Oral Daily  . mirtazapine  30 mg Oral QHS  . QUEtiapine  50 mg Oral QHS  . sodium chloride flush  3 mL Intravenous Q12H  . zinc oxide   Topical BID   Continuous Infusions: . sodium chloride 10 mL/hr at 05/23/16 1035   PRN Meds:.acetaminophen **OR** acetaminophen, loperamide, LORazepam, morphine CONCENTRATE, [DISCONTINUED] ondansetron **OR** ondansetron (ZOFRAN) IV   Data Review:   Micro Results Recent Results (from the past 240 hour(s))  Culture, blood (routine x 2)     Status: None (Preliminary result)   Collection Time: 05/20/16 11:11 AM  Result Value Ref Range Status   Specimen Description BLOOD LEFT ARM  Final   Special Requests BOTTLES DRAWN AEROBIC AND ANAEROBIC   10CC  Final   Culture NO GROWTH 3 DAYS  Final   Report Status PENDING  Incomplete  Urine culture     Status: Abnormal   Collection Time: 05/20/16 11:55 AM  Result Value Ref Range Status   Specimen Description URINE, CATHETERIZED  Final   Special Requests Normal  Final   Culture (A)  Final    >=100,000 COLONIES/mL PROTEUS MIRABILIS >=100,000 COLONIES/mL ESCHERICHIA COLI    Report Status 05/23/2016 FINAL  Final   Organism ID, Bacteria PROTEUS MIRABILIS (A)  Final   Organism ID, Bacteria ESCHERICHIA COLI (A)  Final      Susceptibility   Escherichia coli - MIC*    AMPICILLIN >=32 RESISTANT Resistant     CEFAZOLIN 16 SENSITIVE Sensitive     CEFTRIAXONE <=1 SENSITIVE Sensitive     CIPROFLOXACIN <=0.25 SENSITIVE Sensitive     GENTAMICIN <=1 SENSITIVE Sensitive     IMIPENEM <=0.25 SENSITIVE Sensitive     NITROFURANTOIN <=16 SENSITIVE Sensitive     TRIMETH/SULFA <=20 SENSITIVE Sensitive     AMPICILLIN/SULBACTAM 16 INTERMEDIATE Intermediate     PIP/TAZO <=4 SENSITIVE Sensitive     Extended ESBL NEGATIVE Sensitive     * >=100,000 COLONIES/mL ESCHERICHIA COLI   Proteus mirabilis - MIC*    AMPICILLIN <=2 SENSITIVE Sensitive     CEFAZOLIN <=4 SENSITIVE Sensitive     CEFTRIAXONE <=1 SENSITIVE Sensitive     CIPROFLOXACIN <=0.25 SENSITIVE Sensitive     GENTAMICIN <=1 SENSITIVE Sensitive     IMIPENEM 2 SENSITIVE Sensitive     NITROFURANTOIN 128 RESISTANT Resistant     TRIMETH/SULFA <=20 SENSITIVE Sensitive     AMPICILLIN/SULBACTAM <=2 SENSITIVE Sensitive     PIP/TAZO <=4 SENSITIVE  Sensitive     * >=100,000 COLONIES/mL PROTEUS MIRABILIS  Culture, blood (routine x 2)     Status: None (Preliminary result)   Collection Time: 05/20/16 12:15 PM  Result Value Ref Range Status   Specimen Description BLOOD RIGHT FOREARM  Final   Special Requests   Final    BOTTLES DRAWN AEROBIC AND ANAEROBIC  AER 3CC ANA 5CC   Culture NO GROWTH 3 DAYS  Final   Report Status PENDING  Incomplete  MRSA PCR  Screening     Status: None   Collection Time: 05/20/16  4:39 PM  Result Value Ref Range Status   MRSA by PCR NEGATIVE NEGATIVE Final    Comment:        The GeneXpert MRSA Assay (FDA approved for NASAL specimens only), is one component of a comprehensive MRSA colonization surveillance program. It is not intended to diagnose MRSA infection nor to guide or monitor treatment for MRSA infections.     Radiology Reports Ct Head Wo Contrast  Result Date: 05/20/2016 CLINICAL DATA:  Worsening mental status.  Nursing home patient. EXAM: CT HEAD WITHOUT CONTRAST TECHNIQUE: Contiguous axial images were obtained from the base of the skull through the vertex without intravenous contrast. COMPARISON:  10/17/2015 FINDINGS: Brain: Cerebral atrophy. Mild low density in the periventricular white matter likely related to small vessel disease. Ventriculomegaly is similar over multiple prior exams and favored to be related to atrophy. No infarct, mass lesion, intra-axial, or extra-axial fluid collection. No hemorrhage. Vascular: No hyperdense vessel or unexpected calcification. Skull: No significant soft tissue swelling.  No skull fracture. Sinuses/Orbits: Normal imaged orbits and globes. Hypoplastic right frontal sinus. Other paranasal sinuses and mastoid air cells clear. Cerumen in the external ear canals. Other: None IMPRESSION: 1.  No acute intracranial abnormality. 2.  Cerebral atrophy and small vessel ischemic change. 3. Chronic ventriculomegaly which is favored to be related to cerebral atrophy. Electronically Signed   By: Jeronimo Greaves M.D.   On: 05/20/2016 11:28   US Abdomen Complete  Result Date: 05/20/2016 CLINICAL DATA:  Sepsis. Elevated liver function tests. Flank bruising. EXAM: ABDOMEN ULTRASOUND COMPLETE COMPARISON:  01/22/2015 and abdomen and pelvis CT dated 11/25/2014. FINDINGS: Gallbladder: No gallstones or wall thickening visualized. No sonographic Murphy sign noted by sonographer. Common bile  duct: Diameter: 9.7 mm proximally. Previously 9 mm. No visible stones. Liver: Multiple cysts. IVC: No abnormality visualized. Pancreas: Visualized portion unremarkable. Spleen: Size and appearance within normal limits. Right Kidney: Length: 10.3 cm. Normal echogenicity. Interval 1.9 cm calculus in the midportion of the kidney. This appears to be within the previously demonstrated prominent extrarenal pelvis. No calyceal dilatation. Left Kidney: Length: 11.1 cm. 1.0 cm upper pole cyst. Normal echogenicity. No hydronephrosis. The previously demonstrated lower pole calculus is not currently visualized. Abdominal aorta: No aneurysm visualized. Other findings: None. IMPRESSION: 1. Interval 1.9 cm calculus in the previously demonstrated a prominent extrarenal pelvis on the right. 2. The previously seen lower pole left renal calculus is no longer demonstrated. 3. Previously demonstrated liver cysts and mildly dilated common duct. Electronically Signed   By: Beckie Salts M.D.   On: 05/20/2016 14:43   Dg Chest Portable 1 View  Result Date: 05/20/2016 CLINICAL DATA:  Sepsis EXAM: PORTABLE CHEST 1 VIEW COMPARISON:  Jan 22, 2015 FINDINGS: There is slight bibasilar lung atelectatic change. The lungs elsewhere are clear. Heart size and pulmonary vascularity are normal. No adenopathy. There is atherosclerotic calcification in the aortic arch region. IMPRESSION: Slight bibasilar atelectasis. Lungs elsewhere  clear. There is aortic atherosclerosis. Electronically Signed   By: Bretta Bang III M.D.   On: 05/20/2016 15:10     CBC  Recent Labs Lab 05/20/16 1111 05/21/16 0251 05/22/16 0434  WBC 22.3* 17.2* 16.7*  HGB 12.4 9.7* 10.0*  HCT 38.0 29.7* 30.0*  PLT 171 104* 116*  MCV 82.3 80.9 80.7  MCH 26.9 26.5 26.8  MCHC 32.7 32.7 33.2  RDW 17.0* 16.5* 16.5*  LYMPHSABS 1.6  --   --   MONOABS 2.7*  --   --   EOSABS 0.0  --   --   BASOSABS 0.0  --   --     Chemistries   Recent Labs Lab 05/20/16 1111  05/21/16 0251 05/22/16 0434 05/23/16 0455  NA 146* 148* 149* 144  K 3.8 3.1* 3.6 2.7*  CL 107 122* 119* 109  CO2 14* 16* 20* 25  GLUCOSE 150* 114* 109* 99  BUN 99* 101* 107* 93*  CREATININE 5.84* 5.20* 5.98* 5.61*  CALCIUM 7.1* 6.1* 7.1* 7.2*  MG  --   --  2.1  --   AST 1,067* 358* 143* 73*  ALT 762* 342* 215* 125*  ALKPHOS 173* 101 92 85  BILITOT 0.9 0.7 0.6 0.6   ------------------------------------------------------------------------------------------------------------------ estimated creatinine clearance is 7.2 mL/min (by C-G formula based on SCr of 5.61 mg/dL (H)). ------------------------------------------------------------------------------------------------------------------ No results for input(s): HGBA1C in the last 72 hours. ------------------------------------------------------------------------------------------------------------------ No results for input(s): CHOL, HDL, LDLCALC, TRIG, CHOLHDL, LDLDIRECT in the last 72 hours. ------------------------------------------------------------------------------------------------------------------ No results for input(s): TSH, T4TOTAL, T3FREE, THYROIDAB in the last 72 hours.  Invalid input(s): FREET3 ------------------------------------------------------------------------------------------------------------------ No results for input(s): VITAMINB12, FOLATE, FERRITIN, TIBC, IRON, RETICCTPCT in the last 72 hours.  Coagulation profile  Recent Labs Lab 05/20/16 1111  INR 1.27    No results for input(s): DDIMER in the last 72 hours.  Cardiac Enzymes  Recent Labs Lab 05/20/16 1917 05/20/16 2301 05/21/16 0251  TROPONINI 0.30* 0.25* 0.21*   ------------------------------------------------------------------------------------------------------------------ Invalid input(s): POCBNP    Assessment & Plan   75 year old female with past medical history of advanced dementia, hypertension, history of nephrolithiasis,  chronic kidney disease, chronic anemia who presents to the hospital due to altered mental status.   1. Acute encephalopathy Due to UTI No changes in mental status  2. Sepsis-due to UTI urine culture with Escherichia coli and Proteus Palliative care team has seen and discussed with the daughter now made comfort care   4. Acute kidney injury-secondary to sepsis and severe dehydration.  Appreciate nephrology input no dialysis comfort care Recommended to check labs by palliative care team to assess for hospice home tomorrow   5. Acute rhabdomyolysis-secondary to dehydration and sepsis. No further monitoring comfort care  6. Abnormal LFTs-suspect due to shock liver will repeat LFTs in the morning  7. Elevated troponin-supply demand ischemia from the underlying sepsis. Supportive care as above  8. Dementia-continue Aricept, Namenda.  9. CODE STATUS DO NOT RESUSCITATE        Code Status Orders        Start     Ordered   05/20/16 1638  Do not attempt resuscitation (DNR)  Continuous    Question Answer Comment  In the event of cardiac or respiratory ARREST Do not call a "code blue"   In the event of cardiac or respiratory ARREST Do not perform Intubation, CPR, defibrillation or ACLS   In the event of cardiac or respiratory ARREST Use medication by any route, position, wound care, and other measures  to relive pain and suffering. May use oxygen, suction and manual treatment of airway obstruction as needed for comfort.      05/20/16 1637    Code Status History    Date Active Date Inactive Code Status Order ID Comments User Context   02/17/2015  2:11 PM 02/18/2015 10:31 PM Full Code [id number]  Vanna ScotlandAshley Brandon, MD Inpatient   01/23/2015  1:00 AM 02/04/2015  7:07 PM DNR [id number]  Crissie FiguresEdavally N Reddy, MD Inpatient    Advance Directive Documentation   Flowsheet Row Most Recent Value  Type of Advance Directive  Healthcare Power of Attorney, Living will  Pre-existing out of  facility DNR order (yellow form or pink MOST form)  Yellow form placed in chart (order not valid for inpatient use)  "MOST" Form in Place?  No data           ConsultsNephrology   DVT Prophylaxis  SCDs  Lab Results  Component Value Date   PLT 116 (L) 05/22/2016     Time Spent in minutes   22min Greater than 50% of time spent in care coordination and counseling patient regarding the condition and plan of care.   Auburn BilberryPATEL, Chalyn Amescua M.D on 05/23/2016 at 11:55 AM  Between 7am to 6pm - Pager - [phone]  After 6pm go to [email or web address] - password [email or web address] [facility]  [facility]   Office  [phone]

## 2016-05-23 NOTE — Progress Notes (Signed)
Pharmacy Consult for Electrolyte Monitoring Indication: Hypokalemia  Allergies  Allergen Reactions  . Amoxicillin-Pot Clavulanate Nausea And Vomiting  . Augmentin [Amoxicillin-Pot Clavulanate] Rash  . Latex Rash    Patient Measurements: Height: 5\' 3"  (160 cm) Weight: 134 lb 7.7 oz (61 kg) IBW/kg (Calculated) : 52.4    Vital Signs: Temp: 98.2 F (36.8 C) (09/27 0455) Temp Source: Oral (09/27 0455) BP: 143/67 (09/27 0455) Pulse Rate: 80 (09/27 0455) Intake/Output from previous day: 09/26 0701 - 09/27 0700 In: 2948.8 [P.O.:241; I.V.:2055.4; IV Piggyback:102.4] Out: 1050 [Urine:1050] Intake/Output from this shift: No intake/output data recorded.  Labs:  Recent Labs  05/20/16 1111 05/21/16 0251 05/22/16 0434  WBC 22.3* 17.2* 16.7*  HGB 12.4 9.7* 10.0*  HCT 38.0 29.7* 30.0*  PLT 171 104* 116*  APTT 31  --   --   INR 1.27  --   --      Recent Labs  05/21/16 0251 05/22/16 0434 05/23/16 0455  NA 148* 149* 144  K 3.1* 3.6 2.7*  CL 122* 119* 109  CO2 16* 20* 25  GLUCOSE 114* 109* 99  BUN 101* 107* 93*  CREATININE 5.20* 5.98* 5.61*  CALCIUM 6.1* 7.1* 7.2*  MG  --  2.1  --   PHOS  --  4.1  --   PROT 4.7* 5.0* 4.6*  ALBUMIN 2.1* 2.1* 2.0*  AST 358* 143* 73*  ALT 342* 215* 125*  ALKPHOS 101 92 85  BILITOT 0.7 0.6 0.6  BILIDIR  --  <0.1*  --   IBILI  --  NOT CALCULATED  --    Estimated Creatinine Clearance: 7.2 mL/min (by C-G formula based on SCr of 5.61 mg/dL (H)).    Recent Labs  05/20/16 1632 05/21/16 2043 05/22/16 0744  GLUCAP 99 105* 106*    Medical History: Past Medical History:  Diagnosis Date  . Alzheimer disease   . Alzheimer disease   . Anemia   . Arthritis 2005  . Chronic kidney disease   . Dementia 2012  . H/O cystitis 2010  . Hepatic cyst   . Hypertension 2001   on medicaitons for at least 10 years  . Hypokalemia   . Kidney stone   . Maxillary sinus fracture (HCC)    after fall 2013, with LOC  . Other esophagitis 2011  .  Personal history of tobacco use, presenting hazards to health 2011  . Rectal mass 2010    Medications:  Scheduled:  . citalopram  10 mg Oral Daily  . donepezil  10 mg Oral QHS  . heparin  5,000 Units Subcutaneous Q8H  . memantine  10 mg Oral BID  . meropenem (MERREM) IV  1 g Intravenous Q24H  . mirtazapine  30 mg Oral QHS  . potassium chloride  10 mEq Intravenous Q1 Hr x 4  . QUEtiapine  50 mg Oral QHS  . sodium chloride flush  3 mL Intravenous Q12H  . zinc oxide   Topical BID    Assessment:  Pharmacy consulted to assist in managing electrolytes in this 75 y/o F with  ARF due to sepsis and rhabdomyolysis.   Plan:  K=2.7 KCL IV 10 MEQ x 4 ordered by Dr. Sheryle Hailiamond. Recheck K at 1230.   Ayrton Mcvay D Liann Spaeth 05/23/2016,8:01 AM

## 2016-05-23 NOTE — Progress Notes (Signed)
Critical potassium level of 2.7 reported to on-call provider, Dr. Sheryle Hailiamond. He's to place orders.

## 2016-05-24 DIAGNOSIS — A419 Sepsis, unspecified organism: Principal | ICD-10-CM

## 2016-05-24 LAB — COMPREHENSIVE METABOLIC PANEL
ALK PHOS: 75 U/L (ref 38–126)
ALT: 80 U/L — AB (ref 14–54)
ANION GAP: 6 (ref 5–15)
AST: 49 U/L — ABNORMAL HIGH (ref 15–41)
Albumin: 1.9 g/dL — ABNORMAL LOW (ref 3.5–5.0)
BUN: 85 mg/dL — ABNORMAL HIGH (ref 6–20)
CALCIUM: 7.1 mg/dL — AB (ref 8.9–10.3)
CO2: 29 mmol/L (ref 22–32)
CREATININE: 5.01 mg/dL — AB (ref 0.44–1.00)
Chloride: 108 mmol/L (ref 101–111)
GFR, EST AFRICAN AMERICAN: 9 mL/min — AB (ref >60)
GFR, EST NON AFRICAN AMERICAN: 8 mL/min — AB (ref >60)
Glucose, Bld: 85 mg/dL (ref 65–99)
Potassium: 3.3 mmol/L — ABNORMAL LOW (ref 3.5–5.1)
SODIUM: 143 mmol/L (ref 135–145)
Total Bilirubin: 0.6 mg/dL (ref 0.3–1.2)
Total Protein: 4.4 g/dL — ABNORMAL LOW (ref 6.5–8.1)

## 2016-05-24 MED ORDER — LORAZEPAM 1 MG PO TABS: 1.0000 mg | ORAL_TABLET | ORAL | 0 refills | Status: AC | PRN

## 2016-05-24 MED ORDER — MORPHINE SULFATE (CONCENTRATE) 10 MG/0.5ML PO SOLN: 5.0000 mg | ORAL | 0 refills | Status: AC | PRN

## 2016-05-24 NOTE — Clinical Social Work Note (Signed)
Palliative Care has met with patient's daughter and daughter has decided upon comfort care. Staff reports to CSW that patient's daughter prefers [facility]. Referral made to Santiago Glad with [facility] [place]. Shela Leff MSW,LCSW [phone]

## 2016-05-24 NOTE — Progress Notes (Signed)
Daily Progress Note   Patient Name: Odessa FlemingRachel E Bell       Date: 05/24/2016 DOB: 06/03/1941  Age: 75 y.o. MRN#: [id number] Attending Physician: Auburn BilberryShreyang Patel, MD Primary Care Physician: No PCP Per Patient Admit Date: 05/20/2016  Reason for Consultation/Follow-up: Establishing goals of care, Hospice Evaluation, Non pain symptom management, Pain control and Psychosocial/spiritual support  Subjective:  Again  meet at the patient's bedside along with her daughter  to discuss diagnosis, prognosis, GOC, EOL wishes disposition and options.  The difference between a aggressive medical intervention path  and a palliative comfort care path for this patient at this time was had.   Family feels comfortable with the decision to focus on full comfort, allow a natural death and forego life prolonging measures  Concept of EOL at hospice facility discussed and family is hopeful for transition to a hospice facility  Natural trajectory and expectations at EOL were discussed.  Questions and concerns addressed.     Length of Stay: 4  Current Medications: Scheduled Meds:  . citalopram  10 mg Oral Daily  . mirtazapine  30 mg Oral QHS  . QUEtiapine  50 mg Oral QHS  . sodium chloride flush  3 mL Intravenous Q12H  . zinc oxide   Topical BID    Continuous Infusions: . sodium chloride 10 mL/hr at 05/23/16 1035    PRN Meds: acetaminophen **OR** acetaminophen, loperamide, LORazepam, morphine CONCENTRATE, [DISCONTINUED] ondansetron **OR** ondansetron (ZOFRAN) IV  Physical Exam  Constitutional: She appears lethargic. She appears cachectic. She appears ill.  HENT:  Mouth/Throat: Mucous membranes are pale and dry.  Cardiovascular: Normal rate, regular rhythm and normal heart sounds.   Pulmonary/Chest: She  has decreased breath sounds in the right lower field and the left lower field.  Musculoskeletal:  -generalized weakness and atrophy  Neurological: She appears lethargic.  Skin: Skin is warm and dry.            Vital Signs: BP 124/67 (BP Location: Left Arm)   Pulse 74   Temp 98.1 F (36.7 C) (Oral)   Resp 17   Ht 5\' 3"  (1.6 m)   Wt 61 kg (134 lb 7.7 oz)   SpO2 95%   BMI 23.82 kg/m  SpO2: SpO2: 95 % O2 Device: O2 Device: Not Delivered O2 Flow Rate:    Intake/output  summary:  Intake/Output Summary (Last 24 hours) at 05/24/16 0811 Last data filed at 05/24/16 0446  Gross per 24 hour  Intake              756 ml  Output             1225 ml  Net             -469 ml   LBM: Last BM Date: 05/23/16 Baseline Weight: Weight: 63.5 kg (140 lb) Most recent weight: Weight: 61 kg (134 lb 7.7 oz)       Palliative Assessment/Data: 20 % at best      Patient Active Problem List   Diagnosis Date Noted  . DNR (do not resuscitate) 05/23/2016  . Palliative care by specialist 05/23/2016  . Urinary tract infectious disease   . Adult failure to thrive   . Sepsis (HCC) 05/20/2016  . Rash and nonspecific skin eruption 03/17/2015  . Retained ureteral stent 02/17/2015  . Hypokalemia 01/22/2015  . Elevated LFTs 01/22/2015  . Ankle fracture 01/21/2015  . HLD (hyperlipidemia) 01/21/2015  . Anemia 12/31/2013  . Chronic diarrhea 11/05/2013  . Personal history of colonic polyps 06/04/2013  . Sleep disturbance 01/26/2013  . Hypertension 09/27/2011  . Dementia with behavioral disturbance 09/27/2011    Palliative Care Assessment & Plan    Assessment:  75 y.o. female   admitted on 05/20/2016 with known history of [name], chronic kidney disease, hypertension, history of nephrolithiasis, who presents to the hospital from a skilled nursing facility/memory unit to altered mental status.   Per family she has had continued physical, functional and cognitive decline over the past  several years.  Admitted with UTI, rhabdomyolysis, abnormal LFTs, AKI admitted for treatment.  Focus has shifted to full comfort, no life prolonging measures   Recommendations/Plan:  Transition to hospice facility for EOL care  Goals of Care and Additional Recommendations:  Limitations on Scope of Treatment: Avoid Hospitalization, No Artificial Feeding, No Diagnostics, No Glucose Monitoring, No Hemodialysis, No IV Antibiotics and No IV Fluids  Code Status:    Code Status Orders        Start     Ordered   05/20/16 1638  Do not attempt resuscitation (DNR)  Continuous    Question Answer Comment  In the event of cardiac or respiratory ARREST Do not call a "code blue"   In the event of cardiac or respiratory ARREST Do not perform Intubation, CPR, defibrillation or ACLS   In the event of cardiac or respiratory ARREST Use medication by any route, position, wound care, and other measures to relive pain and suffering. May use oxygen, suction and manual treatment of airway obstruction as needed for comfort.      05/20/16 1637    Code Status History    Date Active Date Inactive Code Status Order ID Comments User Context   02/17/2015  2:11 PM 02/18/2015 10:31 PM Full Code [id number]  Vanna Scotland, MD Inpatient   01/23/2015  1:00 AM 02/04/2015  7:07 PM DNR [id number]  Crissie Figures, MD Inpatient    Advance Directive Documentation   Flowsheet Row Most Recent Value  Type of Advance Directive  Healthcare Power of Attorney, Living will  Pre-existing out of facility DNR order (yellow form or pink MOST form)  Yellow form placed in chart (order not valid for inpatient use)  "MOST" Form in Place?  No data       Prognosis:    < 2 weeks, only sips  and bites being taken, antibiotics stopped/ last WBC 16.7, CKD creatine 5.6. (labs reviewed with daughter)   Discharge Planning:  Hospice facility, will write for choice  Care plan was discussed with Dr Allena Katz  Thank you for allowing the  Palliative Medicine Team to assist in the care of this patient.   Time In: 0740 Time Out: 0815 Total Time 35 min Prolonged Time Billed  no       Greater than 50%  of this time was spent counseling and coordinating care related to the above assessment and plan.  Lorinda Creed, NP  Please contact Palliative Medicine Team phone at [phone] for questions and concerns.

## 2016-05-24 NOTE — Clinical Social Work Note (Signed)
Clydie BraunKaren with hospice made arrangements for patient to transfer to hospice home this morning. CSW prepared a packet. Patient transported by EMS this morning. York SpanielMonica Harris Penton MSW,LCSW [phone]

## 2016-05-24 NOTE — Discharge Summary (Signed)
Kelli FlemingRachel E Matthews, 75 y.o., DOB 11/28/1940, MRN [id number]. Admission date: 05/20/2016 Discharge Date 05/24/2016 Primary MD No PCP Per Patient Admitting Physician Houston SirenVivek J Sainani, MD  Admission Diagnosis  Melena [K92.1] Non-traumatic rhabdomyolysis [M62.82] Sepsis, due to unspecified organism (HCC) [A41.9] Urinary tract infection with hematuria, site unspecified [N39.0, R31.9] Altered mental status, unspecified altered mental status type [R41.82]  Discharge Diagnosis   Active Problems:   Sepsis (HCC)   DNR (do not resuscitate)   Palliative care by specialist   Urinary tract infectious disease   Adult failure to thrive   Acute renal failure due to ATN   Shock liver   Acute rhabdomyolysis   Elevated troponin Advanced dementia       Hospital Course  Kelli RoysRachel Matthews  is a 75 y.o. female with a known history of [name], chronic kidney disease, hypertension, history of nephrolithiasis, who presents to the hospital from a skilled nursing facility to altered mental status. Patient had progression of her dementia. And has had recurrent UTIs. She was admitted to the hospital with worsening mental status noted to have acute renal failure noted to have shock liver as well as sepsis. She was admitted initially to the ICU then subsequently moved to the floor. She was seen by nephrology daughter did not want aggressive therapy such as dialysis. She was seen by palliative care and decided that she has not shown much improvement and is appropriate for hospice home. Family is agreeable to this decision.            Consults  nephrology  Significant Tests:  See full reports for all details     Ct Head Wo Contrast  Result Date: 05/20/2016 CLINICAL DATA:  Worsening mental status.  Nursing home patient. EXAM: CT HEAD WITHOUT CONTRAST TECHNIQUE: Contiguous axial images were obtained from the base of the skull through the vertex without intravenous contrast. COMPARISON:  10/17/2015  FINDINGS: Brain: Cerebral atrophy. Mild low density in the periventricular white matter likely related to small vessel disease. Ventriculomegaly is similar over multiple prior exams and favored to be related to atrophy. No infarct, mass lesion, intra-axial, or extra-axial fluid collection. No hemorrhage. Vascular: No hyperdense vessel or unexpected calcification. Skull: No significant soft tissue swelling.  No skull fracture. Sinuses/Orbits: Normal imaged orbits and globes. Hypoplastic right frontal sinus. Other paranasal sinuses and mastoid air cells clear. Cerumen in the external ear canals. Other: None IMPRESSION: 1.  No acute intracranial abnormality. 2.  Cerebral atrophy and small vessel ischemic change. 3. Chronic ventriculomegaly which is favored to be related to cerebral atrophy. Electronically Signed   By: Jeronimo GreavesKyle  Talbot M.D.   On: 05/20/2016 11:28   [place] Abdomen Complete  Result Date: 05/20/2016 CLINICAL DATA:  Sepsis. Elevated liver function tests. Flank bruising. EXAM: ABDOMEN ULTRASOUND COMPLETE COMPARISON:  01/22/2015 and abdomen and pelvis CT dated 11/25/2014. FINDINGS: Gallbladder: No gallstones or wall thickening visualized. No sonographic Murphy sign noted by sonographer. Common bile duct: Diameter: 9.7 mm proximally. Previously 9 mm. No visible stones. Liver: Multiple cysts. IVC: No abnormality visualized. Pancreas: Visualized portion unremarkable. Spleen: Size and appearance within normal limits. Right Kidney: Length: 10.3 cm. Normal echogenicity. Interval 1.9 cm calculus in the midportion of the kidney. This appears to be within the previously demonstrated prominent extrarenal pelvis. No calyceal dilatation. Left Kidney: Length: 11.1 cm. 1.0 cm upper pole cyst. Normal echogenicity. No hydronephrosis. The previously demonstrated lower pole calculus is not currently visualized. Abdominal aorta: No aneurysm visualized. Other findings: None. IMPRESSION: 1. Interval 1.9  cm calculus in the previously  demonstrated a prominent extrarenal pelvis on the right. 2. The previously seen lower pole left renal calculus is no longer demonstrated. 3. Previously demonstrated liver cysts and mildly dilated common duct. Electronically Signed   By: Beckie Salts M.D.   On: 05/20/2016 14:43   Dg Chest Portable 1 View  Result Date: 05/20/2016 CLINICAL DATA:  Sepsis EXAM: PORTABLE CHEST 1 VIEW COMPARISON:  Jan 22, 2015 FINDINGS: There is slight bibasilar lung atelectatic change. The lungs elsewhere are clear. Heart size and pulmonary vascularity are normal. No adenopathy. There is atherosclerotic calcification in the aortic arch region. IMPRESSION: Slight bibasilar atelectasis. Lungs elsewhere clear. There is aortic atherosclerosis. Electronically Signed   By: Bretta Bang III M.D.   On: 05/20/2016 15:10       Today   Subjective:   Kelli Matthews  patient is confused and unable to provide any history or review of systems daughter at bedside   Objective:   Blood pressure 124/67, pulse 74, temperature 98.1 F (36.7 C), temperature source Oral, resp. rate 17, height 5\' 3"  (1.6 m), weight 134 lb 7.7 oz (61 kg), SpO2 95 %.  .  Intake/Output Summary (Last 24 hours) at 05/24/16 0925 Last data filed at 05/24/16 0916  Gross per 24 hour  Intake              756 ml  Output             1350 ml  Net             -594 ml    Exam VITAL SIGNS: Blood pressure 124/67, pulse 74, temperature 98.1 F (36.7 C), temperature source Oral, resp. rate 17, height 5\' 3"  (1.6 m), weight 134 lb 7.7 oz (61 kg), SpO2 95 %.  GENERAL:  75 y.o.-year-old patient lying in the bed with no acute distress.  EYES: Pupils equal, round, reactive to light and accommodation. No scleral icterus. Extraocular muscles intact.  HEENT: Head atraumatic, normocephalic. Oropharynx and nasopharynx clear.  NECK:  Supple, no jugular venous distention. No thyroid enlargement, no tenderness.  LUNGS: Normal breath sounds bilaterally, no wheezing,  rales,rhonchi or crepitation. No use of accessory muscles of respiration.  CARDIOVASCULAR: S1, S2 normal. No murmurs, rubs, or gallops.  ABDOMEN: Soft, nontender, nondistended. Bowel sounds present. No organomegaly or mass.  EXTREMITIES: No pedal edema, cyanosis, or clubbing.  NEUROLOGIC: Cranial nerves II through XII are intact. Muscle strength 5/5 in all extremities. Sensation intact. Gait not checked.  PSYCHIATRIC: The patient is alert and oriented x 3.  SKIN: No obvious rash, lesion, or ulcer.   Data Review     CBC w Diff: Lab Results  Component Value Date   WBC 16.7 (H) 05/22/2016   HGB 10.0 (L) 05/22/2016   HGB 12.2 10/09/2013   HCT 30.0 (L) 05/22/2016   HCT 36.7 10/09/2013   PLT 116 (L) 05/22/2016   PLT 240 10/09/2013   LYMPHOPCT 7 05/20/2016   LYMPHOPCT 12.2 10/09/2013   BANDSPCT 1 05/20/2016   MONOPCT 12 05/20/2016   MONOPCT 7.4 10/09/2013   EOSPCT 0 05/20/2016   EOSPCT 2.4 10/09/2013   BASOPCT 0 05/20/2016   BASOPCT 1.1 10/09/2013   CMP: Lab Results  Component Value Date   NA 143 05/24/2016   NA 139 10/09/2013   K 3.3 (L) 05/24/2016   K 3.4 (L) 10/09/2013   CL 108 05/24/2016   CL 104 10/09/2013   CO2 29 05/24/2016   CO2 27 10/09/2013  BUN 85 (H) 05/24/2016   BUN 39 (H) 10/09/2013   CREATININE 5.01 (H) 05/24/2016   CREATININE 1.08 03/04/2015   PROT 4.4 (L) 05/24/2016   PROT 7.3 10/09/2013   ALBUMIN 1.9 (L) 05/24/2016   ALBUMIN 3.5 10/09/2013   BILITOT 0.6 05/24/2016   BILITOT 0.2 10/09/2013   ALKPHOS 75 05/24/2016   ALKPHOS 144 (H) 10/09/2013   AST 49 (H) 05/24/2016   AST 35 10/09/2013   ALT 80 (H) 05/24/2016   ALT 40 10/09/2013  .  Micro Results Recent Results (from the past 240 hour(s))  Culture, blood (routine x 2)     Status: None (Preliminary result)   Collection Time: 05/20/16 11:11 AM  Result Value Ref Range Status   Specimen Description BLOOD LEFT ARM  Final   Special Requests BOTTLES DRAWN AEROBIC AND ANAEROBIC  10CC  Final    Culture NO GROWTH 4 DAYS  Final   Report Status PENDING  Incomplete  Urine culture     Status: Abnormal   Collection Time: 05/20/16 11:55 AM  Result Value Ref Range Status   Specimen Description URINE, CATHETERIZED  Final   Special Requests Normal  Final   Culture (A)  Final    >=100,000 COLONIES/mL PROTEUS MIRABILIS >=100,000 COLONIES/mL ESCHERICHIA COLI    Report Status 05/23/2016 FINAL  Final   Organism ID, Bacteria PROTEUS MIRABILIS (A)  Final   Organism ID, Bacteria ESCHERICHIA COLI (A)  Final      Susceptibility   Escherichia coli - MIC*    AMPICILLIN >=32 RESISTANT Resistant     CEFAZOLIN 16 SENSITIVE Sensitive     CEFTRIAXONE <=1 SENSITIVE Sensitive     CIPROFLOXACIN <=0.25 SENSITIVE Sensitive     GENTAMICIN <=1 SENSITIVE Sensitive     IMIPENEM <=0.25 SENSITIVE Sensitive     NITROFURANTOIN <=16 SENSITIVE Sensitive     TRIMETH/SULFA <=20 SENSITIVE Sensitive     AMPICILLIN/SULBACTAM 16 INTERMEDIATE Intermediate     PIP/TAZO <=4 SENSITIVE Sensitive     Extended ESBL NEGATIVE Sensitive     * >=100,000 COLONIES/mL ESCHERICHIA COLI   Proteus mirabilis - MIC*    AMPICILLIN <=2 SENSITIVE Sensitive     CEFAZOLIN <=4 SENSITIVE Sensitive     CEFTRIAXONE <=1 SENSITIVE Sensitive     CIPROFLOXACIN <=0.25 SENSITIVE Sensitive     GENTAMICIN <=1 SENSITIVE Sensitive     IMIPENEM 2 SENSITIVE Sensitive     NITROFURANTOIN 128 RESISTANT Resistant     TRIMETH/SULFA <=20 SENSITIVE Sensitive     AMPICILLIN/SULBACTAM <=2 SENSITIVE Sensitive     PIP/TAZO <=4 SENSITIVE Sensitive     * >=100,000 COLONIES/mL PROTEUS MIRABILIS  Culture, blood (routine x 2)     Status: None (Preliminary result)   Collection Time: 05/20/16 12:15 PM  Result Value Ref Range Status   Specimen Description BLOOD RIGHT FOREARM  Final   Special Requests   Final    BOTTLES DRAWN AEROBIC AND ANAEROBIC  AER 3CC ANA 5CC   Culture NO GROWTH 4 DAYS  Final   Report Status PENDING  Incomplete  MRSA PCR Screening     Status:  None   Collection Time: 05/20/16  4:39 PM  Result Value Ref Range Status   MRSA by PCR NEGATIVE NEGATIVE Final    Comment:        The GeneXpert MRSA Assay (FDA approved for NASAL specimens only), is one component of a comprehensive MRSA colonization surveillance program. It is not intended to diagnose MRSA infection nor to guide or monitor treatment for  MRSA infections.   C difficile quick scan w PCR reflex     Status: None   Collection Time: 05/23/16 10:38 AM  Result Value Ref Range Status   C Diff antigen NEGATIVE NEGATIVE Final   C Diff toxin NEGATIVE NEGATIVE Final   C Diff interpretation No C. difficile detected.  Final        Code Status Orders        Start     Ordered   05/20/16 1638  Do not attempt resuscitation (DNR)  Continuous    Question Answer Comment  In the event of cardiac or respiratory ARREST Do not call a "code blue"   In the event of cardiac or respiratory ARREST Do not perform Intubation, CPR, defibrillation or ACLS   In the event of cardiac or respiratory ARREST Use medication by any route, position, wound care, and other measures to relive pain and suffering. May use oxygen, suction and manual treatment of airway obstruction as needed for comfort.      05/20/16 1637    Code Status History    Date Active Date Inactive Code Status Order ID Comments User Context   02/17/2015  2:11 PM 02/18/2015 10:31 PM Full Code [id number]  Vanna Scotland, MD Inpatient   01/23/2015  1:00 AM 02/04/2015  7:07 PM DNR [id number]  Crissie Figures, MD Inpatient    Advance Directive Documentation   Flowsheet Row Most Recent Value  Type of Advance Directive  Healthcare Power of Attorney, Living will  Pre-existing out of facility DNR order (yellow form or pink MOST form)  Yellow form placed in chart (order not valid for inpatient use)  "MOST" Form in Place?  No data            Discharge Medications     Medication List    STOP taking these medications    acetaminophen 325 MG tablet Commonly known as:  TYLENOL   aspirin 81 MG chewable tablet Commonly known as:  ASPIRIN CHILDRENS   cholecalciferol 1000 units tablet Commonly known as:  VITAMIN D   CULTURELLE Caps   desonide 0.05 % cream Commonly known as:  DESOWEN   donepezil 10 MG tablet Commonly known as:  ARICEPT   eucerin cream   fluticasone 50 MCG/ACT nasal spray Commonly known as:  FLONASE   gentamicin cream 0.1 % Commonly known as:  GARAMYCIN   ibuprofen 400 MG tablet Commonly known as:  ADVIL,MOTRIN   IMODIUM PO   memantine 10 MG tablet Commonly known as:  NAMENDA   metoprolol succinate 25 MG 24 hr tablet Commonly known as:  TOPROL-XL   nitrofurantoin (macrocrystal-monohydrate) 100 MG capsule Commonly known as:  MACROBID   THERAVIM-M Tabs     TAKE these medications   citalopram 20 MG tablet Commonly known as:  CELEXA Take 1 tablet (20 mg total) by mouth daily.   LORazepam 1 MG tablet Commonly known as:  ATIVAN Take 1 tablet (1 mg total) by mouth every 4 (four) hours as needed for anxiety.   mirtazapine 15 MG tablet Commonly known as:  REMERON Take 30 mg by mouth at bedtime.   morphine CONCENTRATE 10 MG/0.5ML Soln concentrated solution Take 0.25 mLs (5 mg total) by mouth every hour as needed for moderate pain, severe pain or shortness of breath.   QUEtiapine 50 MG tablet Commonly known as:  SEROQUEL Take 50 mg by mouth at bedtime.          Total Time in preparing paper work, data evaluation  and todays exam - 35 minutes  Auburn Bilberry M.D on 05/24/2016 at 9:25 AM  [facility]   Office  [phone]

## 2016-05-24 NOTE — Discharge Instructions (Signed)
°  DIET:  As tolerated  DISCHARGE CONDITION:  Stable  ACTIVITY:  Activity as tolerated  OXYGEN:  Home Oxygen: No.   Oxygen Delivery: room air  DISCHARGE LOCATION:  Hospices home    ADDITIONAL DISCHARGE INSTRUCTION:   If you experience worsening of your admission symptoms, develop shortness of breath, life threatening emergency, suicidal or homicidal thoughts you must seek medical attention immediately by calling 911 or calling your MD immediately  if symptoms less severe.  You Must read complete instructions/literature along with all the possible adverse reactions/side effects for all the Medicines you take and that have been prescribed to you. Take any new Medicines after you have completely understood and accpet all the possible adverse reactions/side effects.   Please note  You were cared for by a hospitalist during your hospital stay. If you have any questions about your discharge medications or the care you received while you were in the hospital after you are discharged, you can call the unit and asked to speak with the hospitalist on call if the hospitalist that took care of you is not available. Once you are discharged, your primary care physician will handle any further medical issues. Please note that NO REFILLS for any discharge medications will be authorized once you are discharged, as it is imperative that you return to your primary care physician (or establish a relationship with a primary care physician if you do not have one) for your aftercare needs so that they can reassess your need for medications and monitor your lab values.

## 2016-05-24 NOTE — Progress Notes (Addendum)
New hospice home referral received from [place]. Kelli Matthews is a 75 year old woman with a known history of [name], chronic kidney disease, HTN and kidney stones admitted to [facility] on 9/24 for evaluation of altered mental status. Per chart note review she had not been eating or drinking well for the past few days prior to admission. She was found to have acute kidney injury with acute rhabdomyolysis and sepsis. She has continued to decline despite medical interventions. Palliative Medicine NP Wadie Lessen has met with her family and they have chosen to pursue comfort at the hospice home. Patient seen lying in bed, no verbal response noted. Staff Rn Beth and HHA in during visit, patient had a large green/bile colored bowel movement, she is C-Diff negative. Several bruises noted to her back and a large bruise noted to her left elbow. She continues with no oral intake.  Writer met in the family room with patient's daughter and HCPOA Kelli Matthews to initiate education regarding hospice services, philosophy and team approach to care with good understanding voiced. Questions answered, consents signed. Patient information faxed to Hospice referral.  Report called to the hospice home. Hospital care team aware of and in agreement with plan for discharge today via EMS to the hospice home. Portable DNR form in place in discharge packet. Thank you for the opportunity to be involved in the care of this patient. Flo Shanks RN, BSN, [place] [facility] [place], [facility] [phone] c

## 2016-05-24 NOTE — Progress Notes (Signed)
EMS notified for transport. Hospital care team aware.  Dayna BarkerKaren Robertson RN, BSN, [facility] [place] [place], [facility] [phone] c

## 2016-05-25 LAB — CULTURE, BLOOD (ROUTINE X 2)
CULTURE: NO GROWTH
CULTURE: NO GROWTH

## 2016-06-27 DEATH — deceased

## 2016-08-12 IMAGING — CT CT HEAD W/O CM
2 series · 14 of 30 positions shown, 16 images · non-contrast
Comparison: 10/09/2013.

CLINICAL DATA: Seizure.  Altered mental status.  Alzheimer's.

EXAM:
CT HEAD WITHOUT CONTRAST
TECHNIQUE: Contiguous axial images were obtained from the base of the skull
through the vertex without intravenous contrast.

[Series 2: head bone · axial · 0.40mm/px · z∈[-109,+21]mm · 8 of 81 slices shown]
[im 8/81  bone]
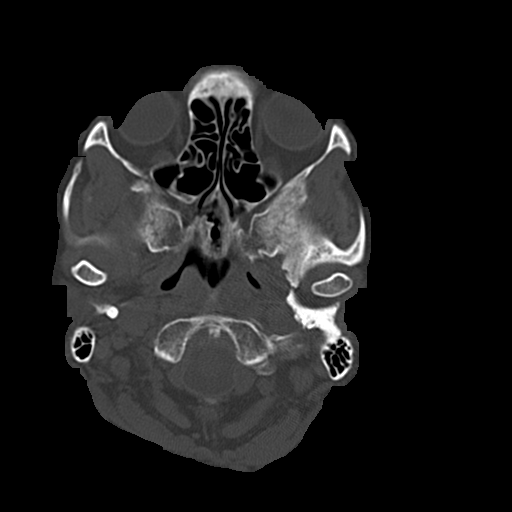
[im 16/81  bone]
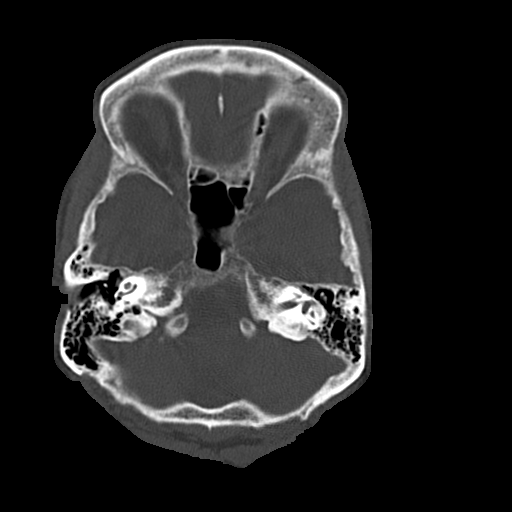
[im 27/81  bone]
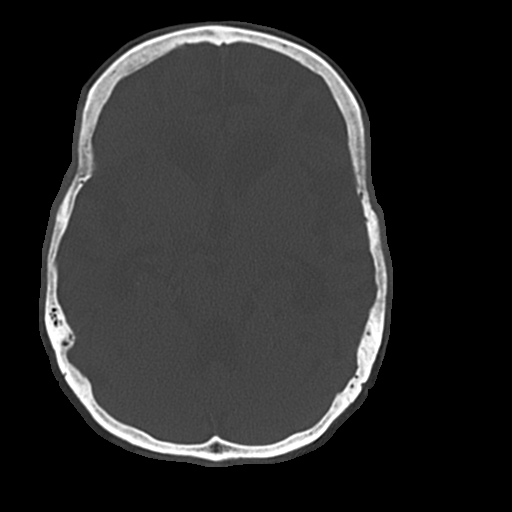
[im 35/81  bone]
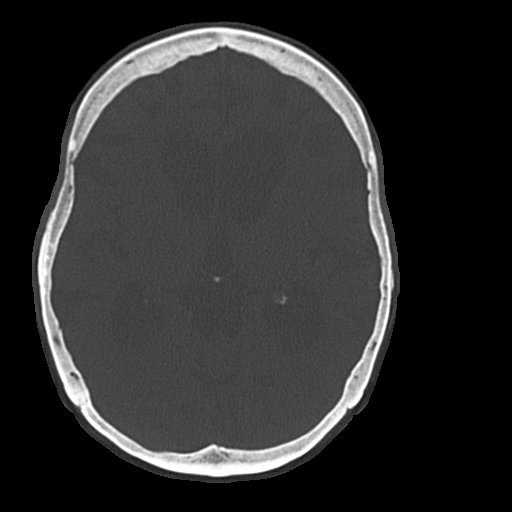
[im 46/81  bone]
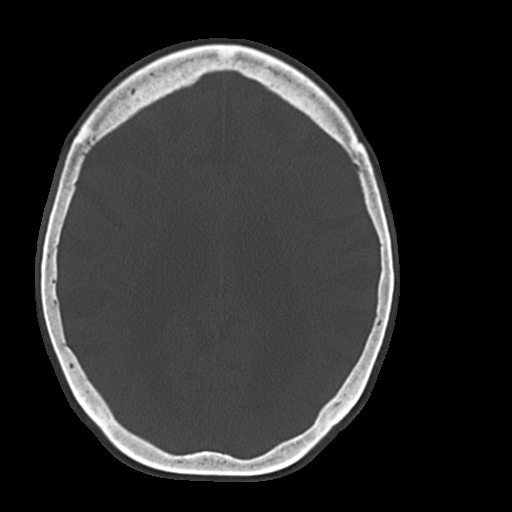
[im 54/81  bone]
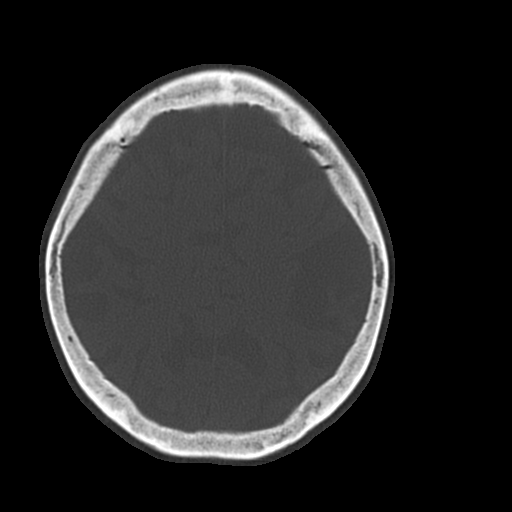
[im 65/81  bone]
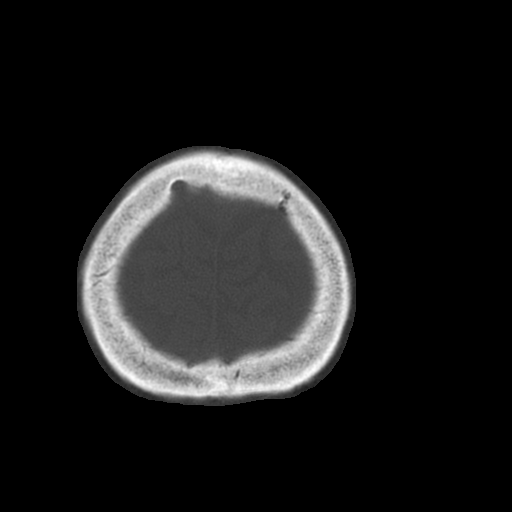
[im 73/81  bone]
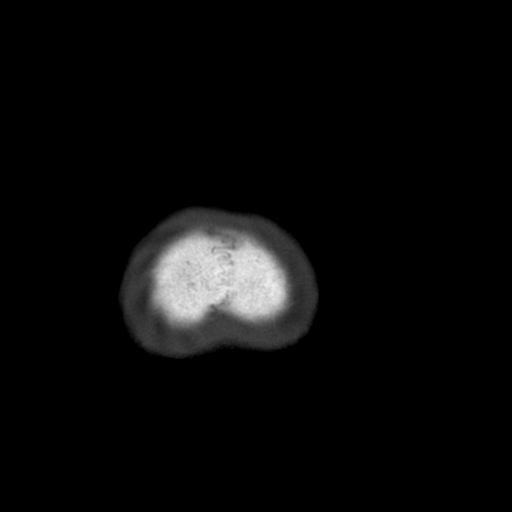

[Series 3: head wo · axial · 0.40mm/px · z∈[-94,+6]mm · 6 of 29 slices shown, 8 images]
[im 5/29  brain]
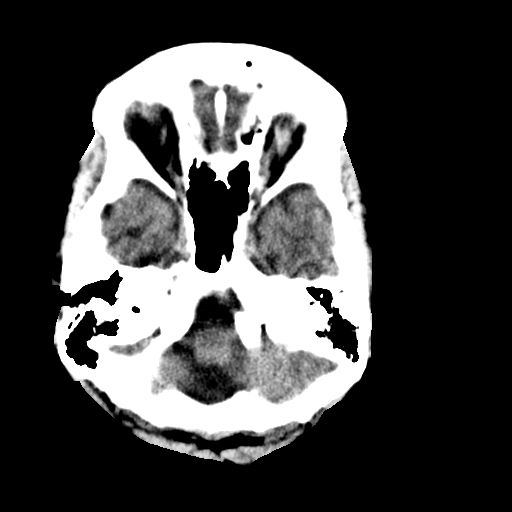
[im 5/29  bone]
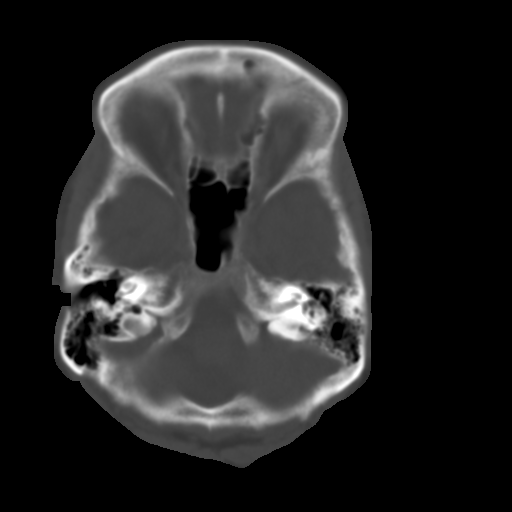
[im 9/29  brain]
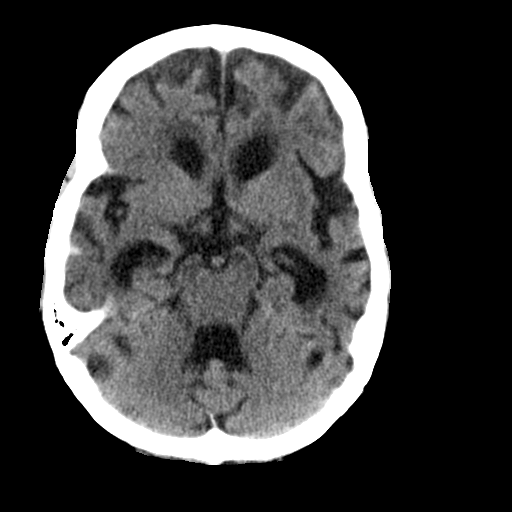
[im 13/29  brain]
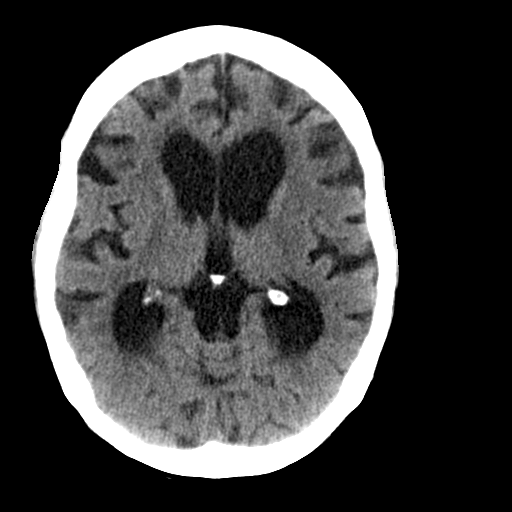
[im 17/29  brain]
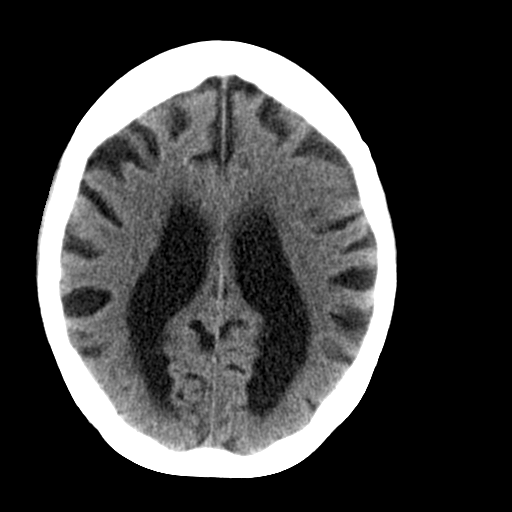
[im 21/29  brain]
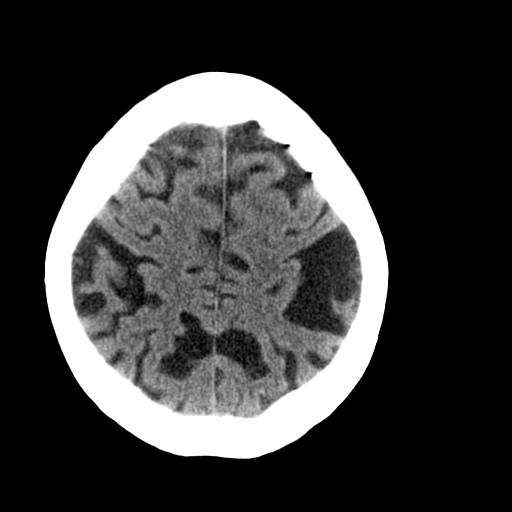
[im 21/29  bone]
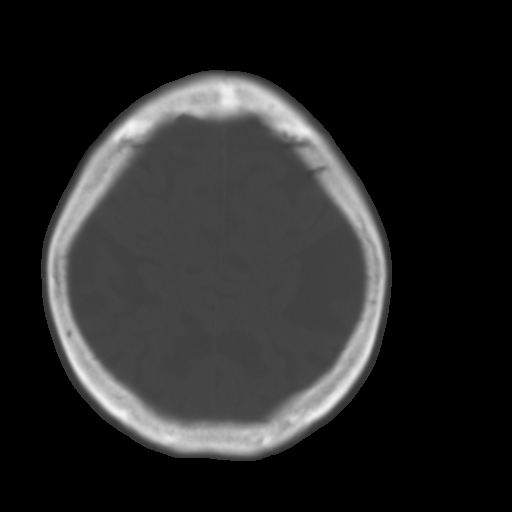
[im 25/29  brain]
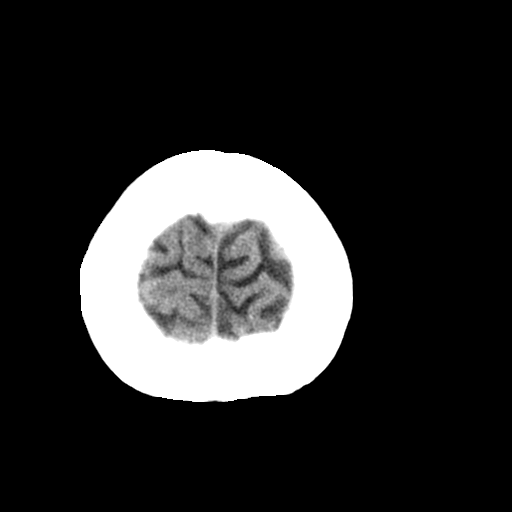

[14 of 30 positions shown; findings below may reference images not displayed]

FINDINGS: Diffusely enlarged ventricles and subarachnoid spaces. Patchy white
matter low density in both cerebral hemispheres. No intracranial
hemorrhage, mass lesion or CT evidence of acute infarction.
Unremarkable bones and included paranasal sinuses.
IMPRESSION: 1. No acute abnormality.
2. Stable moderate to marked diffuse cerebral and cerebellar
atrophy.
3. Stable mild chronic small vessel white matter ischemic changes in
both frontal lobes.
